# Patient Record
Sex: Female | Born: 1950 | Race: White | Hispanic: No | Marital: Married | Smoking: Former smoker
Health system: Southern US, Community
[De-identification: ages and names within clinical notes are randomized; demographics above are authoritative.]

## PROBLEM LIST (undated history)

## (undated) DIAGNOSIS — Z862 Personal history of diseases of the blood and blood-forming organs and certain disorders involving the immune mechanism: Secondary | ICD-10-CM

## (undated) DIAGNOSIS — M810 Age-related osteoporosis without current pathological fracture: Secondary | ICD-10-CM

## (undated) DIAGNOSIS — C801 Malignant (primary) neoplasm, unspecified: Secondary | ICD-10-CM

## (undated) DIAGNOSIS — R112 Nausea with vomiting, unspecified: Secondary | ICD-10-CM

## (undated) DIAGNOSIS — G43909 Migraine, unspecified, not intractable, without status migrainosus: Secondary | ICD-10-CM

## (undated) DIAGNOSIS — T8859XA Other complications of anesthesia, initial encounter: Secondary | ICD-10-CM

## (undated) DIAGNOSIS — J40 Bronchitis, not specified as acute or chronic: Secondary | ICD-10-CM

## (undated) DIAGNOSIS — T7840XA Allergy, unspecified, initial encounter: Secondary | ICD-10-CM

## (undated) DIAGNOSIS — Z9889 Other specified postprocedural states: Secondary | ICD-10-CM

## (undated) DIAGNOSIS — C569 Malignant neoplasm of unspecified ovary: Secondary | ICD-10-CM

## (undated) DIAGNOSIS — D649 Anemia, unspecified: Secondary | ICD-10-CM

## (undated) DIAGNOSIS — N809 Endometriosis, unspecified: Secondary | ICD-10-CM

## (undated) HISTORY — PX: TUBAL LIGATION: SHX77

## (undated) HISTORY — PX: ABDOMINAL HYSTERECTOMY: SHX81

## (undated) HISTORY — DX: Personal history of diseases of the blood and blood-forming organs and certain disorders involving the immune mechanism: Z86.2

## (undated) HISTORY — DX: Endometriosis, unspecified: N80.9

## (undated) HISTORY — PX: FRACTURE SURGERY: SHX138

## (undated) HISTORY — PX: TONSILLECTOMY AND ADENOIDECTOMY: SUR1326

## (undated) HISTORY — DX: Malignant neoplasm of unspecified ovary: C56.9

## (undated) HISTORY — PX: LAPAROSCOPY: SHX197

## (undated) HISTORY — PX: COLONOSCOPY: SHX174

## (undated) HISTORY — DX: Allergy, unspecified, initial encounter: T78.40XA

## (undated) HISTORY — DX: Migraine, unspecified, not intractable, without status migrainosus: G43.909

## (undated) HISTORY — PX: OTHER SURGICAL HISTORY: SHX169

## (undated) HISTORY — DX: Age-related osteoporosis without current pathological fracture: M81.0

---

## 2000-09-21 ENCOUNTER — Other Ambulatory Visit: Admission: RE | Admit: 2000-09-21 | Discharge: 2000-09-21 | Payer: Self-pay | Admitting: Obstetrics and Gynecology

## 2001-09-27 ENCOUNTER — Other Ambulatory Visit: Admission: RE | Admit: 2001-09-27 | Discharge: 2001-09-27 | Payer: Self-pay | Admitting: Obstetrics and Gynecology

## 2003-05-29 ENCOUNTER — Other Ambulatory Visit: Admission: RE | Admit: 2003-05-29 | Discharge: 2003-05-29 | Payer: Self-pay | Admitting: Obstetrics and Gynecology

## 2004-06-28 ENCOUNTER — Other Ambulatory Visit: Admission: RE | Admit: 2004-06-28 | Discharge: 2004-06-28 | Payer: Self-pay | Admitting: Obstetrics and Gynecology

## 2004-09-16 ENCOUNTER — Ambulatory Visit (HOSPITAL_COMMUNITY): Admission: RE | Admit: 2004-09-16 | Discharge: 2004-09-16 | Payer: Self-pay

## 2005-07-28 ENCOUNTER — Other Ambulatory Visit: Admission: RE | Admit: 2005-07-28 | Discharge: 2005-07-28 | Payer: Self-pay | Admitting: Obstetrics and Gynecology

## 2006-08-05 ENCOUNTER — Other Ambulatory Visit: Admission: RE | Admit: 2006-08-05 | Discharge: 2006-08-05 | Payer: Self-pay | Admitting: Obstetrics and Gynecology

## 2007-08-20 ENCOUNTER — Other Ambulatory Visit: Admission: RE | Admit: 2007-08-20 | Discharge: 2007-08-20 | Payer: Self-pay | Admitting: Obstetrics and Gynecology

## 2008-09-01 ENCOUNTER — Other Ambulatory Visit: Admission: RE | Admit: 2008-09-01 | Discharge: 2008-09-01 | Payer: Self-pay | Admitting: Obstetrics and Gynecology

## 2009-06-01 ENCOUNTER — Encounter: Admission: RE | Admit: 2009-06-01 | Discharge: 2009-06-01 | Payer: Self-pay | Admitting: Orthopedic Surgery

## 2011-04-18 NOTE — Op Note (Signed)
NAME:  Karen Winters, DATTILO NO.:  [id number]   MEDICAL RECORD NO.:  [id number]          PATIENT TYPE:  AMB   LOCATION:  ENDO                         FACILITY:  [facility]   PHYSICIAN:  Anselmo Rod, M.D.  DATE OF BIRTH:  25-Oct-1951   DATE OF PROCEDURE:  DATE OF DISCHARGE:                                 OPERATIVE REPORT   PROCEDURE PERFORMED:  Screening colonoscopy.   ENDOSCOPIST:  Charna Elizabeth, M.D.   INSTRUMENT USED:  Olympus video colonoscope.   INDICATIONS FOR PROCEDURE:  The patient is a 59 year old white female  undergoing screening colonoscopy to rule out colonic polyps, masses, etc.   PREPROCEDURE PREPARATION:  Informed consent was procured from the patient.  The patient was fasted for eight hours prior to the procedure and prepped  with a bottle of magnesium citrate and a gallon of GoLYTELY the night prior  to the procedure.   PREPROCEDURE PHYSICAL:  The patient had stable vital signs.  Neck supple.  Chest clear to auscultation.  S1 and S2 regular.  Abdomen soft with normal  bowel sounds.   DESCRIPTION OF PROCEDURE:  The patient was placed in left lateral decubitus  position and sedated with 60 mg of Demerol and 6 mg of Versed in slow  incremental doses.  Once the patient was adequately sedated and maintained  on low flow oxygen and continuous cardiac monitoring, the Olympus video  colonoscope was advanced from the rectum to the cecum.  The appendicular  orifice and ileocecal valve were clearly visualized and photographed.  The  patient's position was changed from the left lateral to supine and the right  lateral position with gentle application of abdominal pressure to reach the  cecum.  Retroflexion in the rectum revealed small internal hemorrhoids.  No  masses, polyps, erosions, ulcerations or diverticula were seen.  The patient  tolerated the procedure well without immediate complications.   IMPRESSION:  1.  Normal colonoscopy up to the cecum except  for small internal      hemorrhoids.  2.  Ileocecal valve and appendicular orifice clearly recognized and appeared      normal.   RECOMMENDATIONS:  1.  Continue high fiber diet with liberal fluid intake.  2.  Repeat colorectal cancer screening is recommended in the next 10 years      unless the patient develops any abnormal symptoms in the interim.  3.  Outpatient followup as need arises in the future.       JNM/MEDQ  D:  09/16/2004  T:  09/16/2004  Job:  [id number]   cc:   Edwena Felty. Romine, M.D.  [address], [address]  [place]  [place] [address]  Fax: [phone]   [facility]

## 2012-03-29 DIAGNOSIS — T6391XA Toxic effect of contact with unspecified venomous animal, accidental (unintentional), initial encounter: Secondary | ICD-10-CM | POA: Insufficient documentation

## 2012-03-29 DIAGNOSIS — G43909 Migraine, unspecified, not intractable, without status migrainosus: Secondary | ICD-10-CM | POA: Insufficient documentation

## 2013-04-04 DIAGNOSIS — Z87891 Personal history of nicotine dependence: Secondary | ICD-10-CM | POA: Insufficient documentation

## 2013-08-25 ENCOUNTER — Encounter: Payer: Self-pay | Admitting: Obstetrics & Gynecology

## 2013-12-07 ENCOUNTER — Ambulatory Visit: Payer: Self-pay | Admitting: Obstetrics and Gynecology

## 2013-12-07 ENCOUNTER — Ambulatory Visit: Payer: Self-pay | Admitting: Obstetrics & Gynecology

## 2013-12-08 ENCOUNTER — Encounter: Payer: Self-pay | Admitting: Obstetrics & Gynecology

## 2013-12-09 ENCOUNTER — Encounter: Payer: Self-pay | Admitting: Obstetrics & Gynecology

## 2013-12-09 ENCOUNTER — Ambulatory Visit (INDEPENDENT_AMBULATORY_CARE_PROVIDER_SITE_OTHER): Payer: BC Managed Care – PPO | Admitting: Obstetrics & Gynecology

## 2013-12-09 VITALS — BP 122/62 | HR 72 | Resp 20 | Ht 65.0 in | Wt 134.0 lb

## 2013-12-09 DIAGNOSIS — Z01419 Encounter for gynecological examination (general) (routine) without abnormal findings: Secondary | ICD-10-CM

## 2013-12-09 NOTE — Progress Notes (Signed)
63 y.o. G2P2 MarriedCaucasianF here for annual exam.  H/O menstrual migraines.  Has relpax for this.  Uses only a few each month.  Just got 24 and doesn't need RF yet.  Form placed in old paper chart, if we need it this year.  Labs with PCP last year--all good.    Patient's last menstrual period was 10/31/2008.          Sexually active: yes  The current method of family planning is tubal ligation.    Exercising: yes  spin, run, swim, elliptical, hike, pliates, stairmaster, and rowing Smoker:  no  Health Maintenance: Pap:  11/10/12 WNL/negative HR HPV History of abnormal Pap:  no MMG:  06/22/13 3D, 06/28/13 right breast cyst aspiration, 6 month [place] scheduled for 12/29/13 Colonoscopy:  2005 repeat in 10 years BMD:   none TDaP:  Will check with PCP appointment scheduled for 4/15 Screening Labs: PCP, Hb today: PCP, Urine today: PCP   reports that she has never smoked. She has never used smokeless tobacco. She reports that she drinks about 0.5 ounces of alcohol per week. She reports that she does not use illicit drugs.  Past Medical History  Diagnosis Date  . Menstrual migraine   . Endometriosis     Past Surgical History  Procedure Laterality Date  . Tonsillectomy and adenoidectomy    . Cesarean section      and BTSP  . Laparoscopy      for infertility-endometriosis  . Laparoscopy      2nd for infertility  . Mole removed      precancerous mole right upper abd  . Breast cyst aspiration      [facility] 7/14    Current Outpatient Prescriptions  Medication Sig Dispense Refill  . acetaminophen (TYLENOL) 325 MG tablet Take 325 mg by mouth every 6 (six) hours as needed.      . eletriptan (RELPAX) 40 MG tablet Take 40 mg by mouth as needed for migraine or headache. One tablet by mouth at onset of headache. May repeat in 2 hours if headache persists or recurs.      Marland Kitchen EPINEPHrine (EPIPEN 2-PAK IJ) Inject as directed as needed.      . Multiple Vitamins-Minerals (MULTIVITAMIN PO) Take by mouth.        No current facility-administered medications for this visit.    Family History  Problem Relation Age of Onset  . Diabetes Maternal Grandmother   . Breast cancer Maternal Grandmother   . Renal cancer Father   . Hypertension Sister   . Hypertension Brother   . Heart attack Paternal Grandfather   . Hypothyroidism Sister     ROS:  Pertinent items are noted in HPI.  Otherwise, a comprehensive ROS was negative.  Exam:   BP 122/62  Pulse 72  Resp 20  Ht 5\' 5"  (1.651 m)  Wt 134 lb (60.782 kg)  BMI 22.30 kg/m2  LMP 10/31/2008  Weight change: +8 lbs   Height: 5\' 5"  (165.1 cm)  Ht Readings from Last 3 Encounters:  12/09/13 5\' 5"  (1.651 m)    General appearance: alert, cooperative and appears stated age Head: Normocephalic, without obvious abnormality, atraumatic Neck: no adenopathy, supple, symmetrical, trachea midline and thyroid normal to inspection and palpation Lungs: clear to auscultation bilaterally Breasts: normal appearance, no masses or tenderness Heart: regular rate and rhythm Abdomen: soft, non-tender; bowel sounds normal; no masses,  no organomegaly Extremities: extremities normal, atraumatic, no cyanosis or edema Skin: Skin color, texture, turgor normal. No rashes  or lesions Lymph nodes: Cervical, supraclavicular, and axillary nodes normal. No abnormal inguinal nodes palpated Neurologic: Grossly normal   Pelvic: External genitalia:  no lesions              Urethra:  normal appearing urethra with no masses, tenderness or lesions              Bartholins and Skenes: normal                 Vagina: normal appearing vagina with normal color and discharge, no lesions              Cervix: no lesions              Pap taken: no Bimanual Exam:  Uterus:  normal size, contour, position, consistency, mobility, non-tender              Adnexa: normal adnexa and no mass, fullness, tenderness               Rectovaginal: Confirms               Anus:  normal sphincter tone, no  lesions  A:  Well Woman with normal exam Menopausal, not on HRT H/O menstrual migraines H/O endometriosis  P:   Mammogram yearly.  H/O cyst aspiration.  Has follow up scheduled for the end of the month. pap smear with neg HR HPV 12/13.  No Pap today. Schedule BMD this year. Colonoscopy due end of the year. Does not need rx for Relpax 40mg .  Will call if need RF.  [facility]. return annually or prn  An After Visit Summary was printed and given to the patient.

## 2013-12-09 NOTE — Patient Instructions (Signed)

## 2013-12-29 ENCOUNTER — Telehealth: Payer: Self-pay | Admitting: Emergency Medicine

## 2013-12-29 DIAGNOSIS — M810 Age-related osteoporosis without current pathological fracture: Secondary | ICD-10-CM

## 2013-12-29 NOTE — Telephone Encounter (Signed)
Patient at the [facility] states that she was told she needs Dexa. Dexa needed per [facility]EX with Dr. Sabra Heck at 12/09/13. Dexa ordered. Will be done today.  Routing to provider for final review. Patient agreeable to disposition. Will close encounter

## 2014-01-05 ENCOUNTER — Telehealth: Payer: Self-pay

## 2014-01-05 NOTE — Telephone Encounter (Signed)
lmtcb to discuss BMD//kn 

## 2014-01-05 NOTE — Telephone Encounter (Signed)
Patient notified of BMD results-appointment to discuss made for 01/13/14//kn

## 2014-01-13 ENCOUNTER — Ambulatory Visit (INDEPENDENT_AMBULATORY_CARE_PROVIDER_SITE_OTHER): Payer: BC Managed Care – PPO | Admitting: Obstetrics & Gynecology

## 2014-01-13 ENCOUNTER — Encounter: Payer: Self-pay | Admitting: Obstetrics & Gynecology

## 2014-01-13 VITALS — BP 125/92 | HR 67 | Resp 18 | Ht 65.0 in | Wt 132.0 lb

## 2014-01-13 DIAGNOSIS — M810 Age-related osteoporosis without current pathological fracture: Secondary | ICD-10-CM

## 2014-01-13 LAB — COMPREHENSIVE METABOLIC PANEL
ALK PHOS: 85 U/L (ref 39–117)
ALT: 12 U/L (ref 0–35)
AST: 23 U/L (ref 0–37)
Albumin: 4.1 g/dL (ref 3.5–5.2)
BUN: 14 mg/dL (ref 6–23)
CO2: 28 mEq/L (ref 19–32)
CREATININE: 0.72 mg/dL (ref 0.50–1.10)
Calcium: 9.9 mg/dL (ref 8.4–10.5)
Chloride: 108 mEq/L (ref 96–112)
Glucose, Bld: 87 mg/dL (ref 70–99)
POTASSIUM: 5.2 meq/L (ref 3.5–5.3)
Sodium: 143 mEq/L (ref 135–145)
Total Bilirubin: 0.5 mg/dL (ref 0.2–1.2)
Total Protein: 7 g/dL (ref 6.0–8.3)

## 2014-01-13 NOTE — Patient Instructions (Signed)
Osteoporosis Throughout your life, your body breaks down old bone and replaces it with new bone. As you get older, your body does not replace bone as quickly as it breaks it down. By the age of 30 years, most people begin to gradually lose bone because of the imbalance between bone loss and replacement. Some people lose more bone than others. Bone loss beyond a specified normal degree is considered osteoporosis.  Osteoporosis affects the strength and durability of your bones. The inside of the ends of your bones and your flat bones, like the bones of your pelvis, look like honeycomb, filled with tiny open spaces. As bone loss occurs, your bones become less dense. This means that the open spaces inside your bones become bigger and the walls between these spaces become thinner. This makes your bones weaker. Bones of a person with osteoporosis can become so weak that they can break (fracture) during minor accidents, such as a simple fall. CAUSES  The following factors have been associated with the development of osteoporosis:  Smoking.  Drinking more than 2 alcoholic drinks several days per week.  Long-term use of certain medicines:  Corticosteroids.  Chemotherapy medicines.  Thyroid medicines.  Antiepileptic medicines.  Gonadal hormone suppression medicine.  Immunosuppression medicine.  Being underweight.  Lack of physical activity.  Lack of exposure to the sun. This can lead to vitamin D deficiency.  Certain medical conditions:  Certain inflammatory bowel diseases, such as Crohn disease and ulcerative colitis.  Diabetes.  Hyperthyroidism.  Hyperparathyroidism. RISK FACTORS Anyone can develop osteoporosis. However, the following factors can increase your risk of developing osteoporosis:  Gender Women are at higher risk than men.  Age Being older than 50 years increases your risk.  Ethnicity White and Asian people have an increased risk.  Weight Being extremely  underweight can increase your risk of osteoporosis.  Family history of osteoporosis Having a family member who has developed osteoporosis can increase your risk. SYMPTOMS  Usually, people with osteoporosis have no symptoms.  DIAGNOSIS  Signs during a physical exam that may prompt your caregiver to suspect osteoporosis include:  Decreased height. This is usually caused by the compression of the bones that form your spine (vertebrae) because they have weakened and become fractured.  A curving or rounding of the upper back (kyphosis). To confirm signs of osteoporosis, your caregiver may request a procedure that uses 2 low-dose X-ray beams with different levels of energy to measure your bone mineral density (dual-energy X-ray absorptiometry [DXA]). Also, your caregiver may check your level of vitamin D. TREATMENT  The goal of osteoporosis treatment is to strengthen bones in order to decrease the risk of bone fractures. There are different types of medicines available to help achieve this goal. Some of these medicines work by slowing the processes of bone loss. Some medicines work by increasing bone density. Treatment also involves making sure that your levels of calcium and vitamin D are adequate. PREVENTION  There are things you can do to help prevent osteoporosis. Adequate intake of calcium and vitamin D can help you achieve optimal bone mineral density. Regular exercise can also help, especially resistance and weight-bearing activities. If you smoke, quitting smoking is an important part of osteoporosis prevention. MAKE SURE YOU:  Understand these instructions.  Will watch your condition.  Will get help right away if you are not doing well or get worse. FOR MORE INFORMATION www.osteo.org and www.nof.org Document Released: 08/27/2005 Document Revised: 03/14/2013 Document Reviewed: 11/01/2011 [employer] Patient Information 2014 [employer].  

## 2014-01-13 NOTE — Progress Notes (Signed)
Patient ID: Karen Winters, female   DOB: [birth year], 63 y.o.   MRN: [id number]  Subjective:    63 yrs Married Caucasian G2P2  female here to discuss recent BMD obtained 1/15 at [facility] showing osteoporosis in right hip, total measurement, as well as in two vertebral measurements.  There was osteopenia in all other measurements.     Osteoporosis Risk Factors  Nonmodifiable Personal Hx of fracture as an adult: no Hx of fracture in first-degree relative: no Caucasian race: yes Advanced age: no Female sex: yes Dementia: no Poor health/frailty: no  Potentially modifiable: Tobacco use: no Low body weight (<127 lbs): no Estrogen deficiency  early menopause (age <45) or bilateral ovariectomy: no  prolonged premenopausal amenorrhea (>1 yr): no Low calcium intake (lifelong): yes Alcohol use more than 2 drinks per day: no Recurrent falls: no Inadequate physical activity: no, patient is an avid exerciser  Current calcium and Vit D intake: 200mg  calcium and 800 IU Vit D in MVI  Review of Systems A comprehensive review of systems was negative.     Objective:   PHYSICAL EXAM BP 125/92  Pulse 67  Resp 18  Ht 5\' 5"  (1.651 m)  Wt 132 lb (59.875 kg)  BMI 21.97 kg/m2  LMP 10/31/2008 General appearance: alert and cooperative  Imaging Bone Density: Spine T Score: -2.0, total, Hip T Score: -2.6 right hip (neck -2.3)   Done on 1/15 FRAX score:  10 year probability of hip fracture: 3%                        10-year probability of major osteoporotic fractures combined is 17.2%                                          Assessment:   Osteoporosis with T score -2.6 right hip   Plan:   1.  Patient counseled in adequate calcium and vitamin D exposure.  Calcium - 500 - 1000 mg elemental calcium/day in divided doses  Vitamin D - 800 IU/day  Told not to take at same time as bisphosphonate. 2.  Exercise recommended at least 30 minutes 3 times per week.  3.  Pt adamantly declines oral  medication therapy 4.  TSH, CMP, Vit D today 5.  Repeat bone density in 2 years.  ~15 minutes spent with patient >50% of time was in face to face discussion of above.

## 2014-01-14 LAB — TSH: TSH: 0.985 u[IU]/mL (ref 0.350–4.500)

## 2014-01-14 LAB — VITAMIN D 25 HYDROXY (VIT D DEFICIENCY, FRACTURES): VIT D 25 HYDROXY: 44 ng/mL (ref 30–89)

## 2014-01-17 ENCOUNTER — Telehealth: Payer: Self-pay

## 2014-01-17 NOTE — Telephone Encounter (Signed)
Lmtcb//kn 

## 2014-01-17 NOTE — Telephone Encounter (Signed)
Message copied by Robley Fries on Tue Jan 17, 2014 11:30 AM ------      Message from: Megan Salon      Created: Sun Jan 15, 2014  3:26 PM       Inform vit d 4.  CMP nl.  TSH nl.  Patient had BMD consult last week.  Not going to start medications.  Will repeat BMD two years. ------

## 2014-01-19 NOTE — Telephone Encounter (Signed)
Patient notified of all lab results.

## 2014-06-20 ENCOUNTER — Telehealth: Payer: Self-pay | Admitting: Obstetrics & Gynecology

## 2014-06-20 NOTE — Telephone Encounter (Signed)
Left detailed message at number provided [phone]. Okay per ROI and patient. Advised last colonoscopy we have on file is for 2005. Last seen with Dr.Mann in Oct 2005. Advised if any further questions to give [place] a call back at [phone].  Routing to provider for final review. Patient agreeable to disposition. Will close encounter

## 2014-06-20 NOTE — Telephone Encounter (Signed)
Pt is calling to find out when her last colonoscopy was? She thinks it was with Dr. Collene Mares in October 2005. Pt states it is ok to leave a detailed message.

## 2014-10-02 ENCOUNTER — Encounter: Payer: Self-pay | Admitting: Obstetrics & Gynecology

## 2014-12-28 ENCOUNTER — Encounter: Payer: Self-pay | Admitting: Obstetrics & Gynecology

## 2014-12-28 ENCOUNTER — Ambulatory Visit (INDEPENDENT_AMBULATORY_CARE_PROVIDER_SITE_OTHER): Payer: BLUE CROSS/BLUE SHIELD | Admitting: Obstetrics & Gynecology

## 2014-12-28 VITALS — BP 142/90 | HR 64 | Resp 16 | Ht 64.75 in | Wt 135.2 lb

## 2014-12-28 DIAGNOSIS — Z124 Encounter for screening for malignant neoplasm of cervix: Secondary | ICD-10-CM

## 2014-12-28 DIAGNOSIS — Z01419 Encounter for gynecological examination (general) (routine) without abnormal findings: Secondary | ICD-10-CM

## 2014-12-28 MED ORDER — ELETRIPTAN HYDROBROMIDE 40 MG PO TABS: 40.0000 mg | ORAL_TABLET | ORAL | Status: DC | PRN

## 2014-12-28 NOTE — Progress Notes (Signed)
64 y.o. G2P2 MarriedCaucasianF here for annual exam.  No vaginal bleeding.  Doing well.  Has had blood work this year.  Brought BPs here with her as she has white coat hypertension.  BPs are all 93-127/63/79.    Patient's last menstrual period was 10/31/2008.          Sexually active: Yes.    The current method of family planning is tubal ligation.    Exercising: Yes.    walking, running, spin class, body pump, and swimming Smoker:  Former smoker years ago  Health Maintenance: Pap:  11/10/12 WNL/negative HR HPV History of abnormal Pap:  no MMG:  07/19/14 3D-normal Colonoscopy:  2005-repeat scheduled for 01/03/15 with Dr Collene Mares BMD:   12/29/13-osteoporosis TDaP:  08/15/14 with PCP Screening Labs: PCP, Hb today: PCP, Urine today: PCP   reports that she has quit smoking. She has never used smokeless tobacco. She reports that she drinks alcohol. She reports that she does not use illicit drugs.  Past Medical History  Diagnosis Date  . Menstrual migraine   . Endometriosis   . Osteoporosis     Past Surgical History  Procedure Laterality Date  . Tonsillectomy and adenoidectomy    . Cesarean section  1986, 1990    and BTSP  . Laparoscopy      for infertility-endometriosis  . Laparoscopy      2nd for infertility  . Mole removed      precancerous mole right upper abd  . Breast cyst aspiration      [facility] 7/14    Current Outpatient Prescriptions  Medication Sig Dispense Refill  . acetaminophen (TYLENOL) 325 MG tablet Take 325 mg by mouth every 6 (six) hours as needed.    . Calcium-Magnesium-Vitamin D (CALCIUM 500 PO) Take by mouth daily.    Marland Kitchen eletriptan (RELPAX) 40 MG tablet Take 40 mg by mouth as needed for migraine or headache. One tablet by mouth at onset of headache. May repeat in 2 hours if headache persists or recurs.    Marland Kitchen EPINEPHrine (EPIPEN 2-PAK IJ) Inject as directed as needed.    . Multiple Vitamins-Minerals (MULTIVITAMIN PO) Take by mouth.     No current  facility-administered medications for this visit.    Family History  Problem Relation Age of Onset  . Diabetes Maternal Grandmother   . Breast cancer Maternal Grandmother   . Renal cancer Father   . Hypertension Sister   . Hypertension Brother   . Heart attack Paternal Grandfather   . Hypothyroidism Sister     ROS:  Pertinent items are noted in HPI.  Otherwise, a comprehensive ROS was negative.  Exam:   General appearance: alert, cooperative and appears stated age Head: Normocephalic, without obvious abnormality, atraumatic Neck: no adenopathy, supple, symmetrical, trachea midline and thyroid normal to inspection and palpation Lungs: clear to auscultation bilaterally Breasts: normal appearance, no masses or tenderness Heart: regular rate and rhythm Abdomen: soft, non-tender; bowel sounds normal; no masses,  no organomegaly Extremities: extremities normal, atraumatic, no cyanosis or edema Skin: Skin color, texture, turgor normal. No rashes or lesions Lymph nodes: Cervical, supraclavicular, and axillary nodes normal. No abnormal inguinal nodes palpated Neurologic: Grossly normal   Pelvic: External genitalia:  no lesions              Urethra:  normal appearing urethra with no masses, tenderness or lesions              Bartholins and Skenes: normal  Vagina: normal appearing vagina with normal color and discharge, no lesions              Cervix: no lesions              Pap taken: Yes.   Bimanual Exam:  Uterus:  normal size, contour, position, consistency, mobility, non-tender              Adnexa: normal adnexa and no mass, fullness, tenderness               Rectovaginal: Confirms               Anus:  normal sphincter tone, no lesions  Chaperone was present for exam.  A:  Well Woman with normal exam Menopausal, not on HRT H/O menstrual migraines H/O endometriosis Osteoporosis  P: Mammogram yearly.  pap smear with neg HR HPV 12/13. Pap today. BMD  due beginning of next year Colonoscopy scheduled for next week Relpax 40mg , can repeat in 2 hours if needed.  #24 tablets/4RF. return annually or prn

## 2014-12-29 LAB — IPS PAP TEST WITH REFLEX TO HPV

## 2016-02-28 ENCOUNTER — Encounter: Payer: Self-pay | Admitting: Obstetrics & Gynecology

## 2016-02-28 ENCOUNTER — Ambulatory Visit (INDEPENDENT_AMBULATORY_CARE_PROVIDER_SITE_OTHER): Payer: BLUE CROSS/BLUE SHIELD | Admitting: Obstetrics & Gynecology

## 2016-02-28 VITALS — BP 140/80 | HR 68 | Resp 16 | Ht 65.0 in | Wt 132.0 lb

## 2016-02-28 DIAGNOSIS — Z205 Contact with and (suspected) exposure to viral hepatitis: Secondary | ICD-10-CM | POA: Diagnosis not present

## 2016-02-28 DIAGNOSIS — Z01419 Encounter for gynecological examination (general) (routine) without abnormal findings: Secondary | ICD-10-CM

## 2016-02-28 LAB — HEPATITIS C ANTIBODY: HCV AB: NEGATIVE

## 2016-02-28 MED ORDER — ELETRIPTAN HYDROBROMIDE 40 MG PO TABS: 40.0000 mg | ORAL_TABLET | ORAL | Status: DC | PRN

## 2016-02-28 NOTE — Progress Notes (Signed)
65 y.o. G2P2 MarriedCaucasianF here for annual exam.  Doing well.  No vaginal bleeding.    PCP:  Dr. Coletta Memos  Patient's last menstrual period was 10/31/2008.          Sexually active: Yes.    The current method of family planning is tubal ligation and post menopausal status.    Exercising: Yes.    Spin, running, elliptical  Smoker:  no  Health Maintenance: Pap:  12/28/14 Neg. 10/2012 Neg. HR HPV:neg History of abnormal Pap:  no MMG:  07/24/15 BIRADS1:neg Colonoscopy:  01/03/15 Normal.  Follow-up 10 year.  Dr. Collene Mares. BMD:   12/29/13 Osteoporosis.  Will repeat with MMG TDaP:  08/2014  Screening Labs: PCP, Urine today: PCP   reports that she has quit smoking. She has never used smokeless tobacco. She reports that she drinks alcohol. She reports that she does not use illicit drugs.  Past Medical History  Diagnosis Date  . Menstrual migraine   . Endometriosis   . Osteoporosis     Past Surgical History  Procedure Laterality Date  . Tonsillectomy and adenoidectomy    . Cesarean section  1986, 1990    and BTSP  . Laparoscopy      for infertility-endometriosis  . Laparoscopy      2nd for infertility  . Mole removed      precancerous mole right upper abd  . Breast cyst aspiration      [facility] 7/14    Current Outpatient Prescriptions  Medication Sig Dispense Refill  . acetaminophen (TYLENOL) 325 MG tablet Take 325 mg by mouth every 6 (six) hours as needed.    . Calcium-Magnesium-Vitamin D (CALCIUM 500 PO) Take by mouth daily.    Marland Kitchen eletriptan (RELPAX) 40 MG tablet Take 1 tablet (40 mg total) by mouth as needed for migraine or headache. Max dosage 40mg  in 24 hours 24 tablet 4  . EPINEPHrine (EPIPEN 2-PAK IJ) Inject as directed as needed.    . Multiple Vitamins-Minerals (MULTIVITAMIN PO) Take by mouth.     No current facility-administered medications for this visit.    Family History  Problem Relation Age of Onset  . Diabetes Maternal Grandmother   . Breast cancer Maternal  Grandmother   . Renal cancer Father   . Hypertension Sister   . Hypertension Brother   . Heart attack Paternal Grandfather   . Hypothyroidism Sister     ROS:  Pertinent items are noted in HPI.  Otherwise, a comprehensive ROS was negative.  Exam:   General appearance: alert, cooperative and appears stated age Head: Normocephalic, without obvious abnormality, atraumatic Neck: no adenopathy, supple, symmetrical, trachea midline and thyroid normal to inspection and palpation Lungs: clear to auscultation bilaterally Breasts: normal appearance, no masses or tenderness Heart: regular rate and rhythm Abdomen: soft, non-tender; bowel sounds normal; no masses,  no organomegaly Extremities: extremities normal, atraumatic, no cyanosis or edema Skin: Skin color, texture, turgor normal. No rashes or lesions Lymph nodes: Cervical, supraclavicular, and axillary nodes normal. No abnormal inguinal nodes palpated Neurologic: Grossly normal   Pelvic: External genitalia:  no lesions              Urethra:  normal appearing urethra with no masses, tenderness or lesions              Bartholins and Skenes: normal                 Vagina: normal appearing vagina with normal color and discharge, no lesions  Cervix: no lesions              Pap taken: No. Bimanual Exam:  Uterus:  normal size, contour, position, consistency, mobility, non-tender              Adnexa: normal adnexa and no mass, fullness, tenderness               Rectovaginal: Confirms               Anus:  normal sphincter tone, no lesions  Chaperone was present for exam.  A:  Well Woman with normal exam Menopausal, not on HRT H/O menstrual migraines, much improved H/O endometriosis Osteoporosis  P: Mammogram yearly.  pap smear with neg HR HPV 12/13. Pap 2016 neg.  No pap today. BMD and MMG 8/17.  Pt will schedule. Relpax 40mg , can repeat in 2 hours if needed. #24 tablets/3RF.  Rx sent to mail order.  Pt will  call if cost is still ridiculous. Return annually

## 2016-02-29 ENCOUNTER — Telehealth: Payer: Self-pay | Admitting: Obstetrics & Gynecology

## 2016-02-29 NOTE — Telephone Encounter (Signed)
Return call to patient. She states Relpax price for 90 day supply is 845 dollars per month and patient does not want do that this year.  She states she and Dr. Sabra Heck discussed this. Patient has not tried any alternatives.  Attempted to understand from patient if she was aware of what her insurance may cover, but patient states "thats all, that's why I called." Advised will send information to Dr. Sabra Heck.  Dr. Sabra Heck, I cannot tell by patients insurance card if she has prescription coverage, or may need to meet a deductible prior to coverage of medications.

## 2016-02-29 NOTE — Telephone Encounter (Signed)
Patient is calling regarding the migraine prescription given yesterday. Patient would to change due to cost. Confirmed pharmacy with patient.

## 2016-03-06 MED ORDER — SUMATRIPTAN SUCCINATE 100 MG PO TABS: 100.0000 mg | ORAL_TABLET | Freq: Once | ORAL | Status: DC

## 2016-03-06 NOTE — Telephone Encounter (Signed)
I'd recommend trying imitrex 100mg  at headache onset and then repeat in 2 hours if needed.  Max dosage 200mg /24 hrs.  #9/12 RF to pharmacy to see cost.  Can always have 90 day supply dispensed if this is what pt desires.  Thanks.

## 2016-03-06 NOTE — Telephone Encounter (Signed)
Patient called and she is advised of message from Dr. Sabra Heck.  She will try Imitrex and use as directed and verbalized understanding of instructions for medication.  She is advised to call with any concerns about coverage of medication and requests 90 day supply be sent to [facility].  Rx is sent.  Routing to provider for final review. Patient agreeable to disposition. Will close encounter.

## 2016-08-05 ENCOUNTER — Encounter: Payer: Self-pay | Admitting: Obstetrics & Gynecology

## 2016-08-05 ENCOUNTER — Telehealth: Payer: Self-pay | Admitting: *Deleted

## 2016-08-05 NOTE — Telephone Encounter (Signed)
Message left to return call to Panagiotis Oelkers at [phone] to review BMD results.  

## 2016-08-05 NOTE — Telephone Encounter (Signed)
Patient returned call. Patient notified of BMD results. Patient declined to schedule consult stating, "I had one two years ago and Dr. Sabra Heck knows I don't want to take anything. Not until I fell and broke something. I run 20 miles a week" Patient asked the percentage at which her BMD had changed. RN advised that is something Dr. Sabra Heck could review at consult. Patient said if Dr. Sabra Heck really thought it was a drastic change she would be willing to come in for consult. RN advised this message would be sent to Dr. Sabra Heck and our office would return call with any further recommendations.   Routing to provider for review.

## 2016-08-08 NOTE — Telephone Encounter (Signed)
It was a 4% and 7% change in hips and spine from prior one.  Now she does have osteoporosis and she did not with the prior one.  It is ok if she declines any therapy and we will discuss this when she comes for her next AEX.  Just wanted her to be clear there is not osteoporosis.  OK to notify pt and then close encounter.

## 2016-08-12 NOTE — Telephone Encounter (Signed)
Left message per DPR with message from Dr. Sabra Heck. Instructed patient to call with any questions.   Routing to provider for final review. Patient agreeable to disposition. Will close encounter.

## 2017-04-28 ENCOUNTER — Other Ambulatory Visit (HOSPITAL_COMMUNITY)
Admission: RE | Admit: 2017-04-28 | Discharge: 2017-04-28 | Disposition: A | Discharge status: Home / Self Care | Payer: BLUE CROSS/BLUE SHIELD | Source: Ambulatory Visit | Attending: Obstetrics & Gynecology | Admitting: Obstetrics & Gynecology

## 2017-04-28 ENCOUNTER — Encounter: Payer: Self-pay | Admitting: Obstetrics & Gynecology

## 2017-04-28 ENCOUNTER — Ambulatory Visit (INDEPENDENT_AMBULATORY_CARE_PROVIDER_SITE_OTHER): Payer: BLUE CROSS/BLUE SHIELD | Admitting: Obstetrics & Gynecology

## 2017-04-28 VITALS — BP 120/76 | HR 80 | Resp 16 | Ht 64.5 in | Wt 129.0 lb

## 2017-04-28 DIAGNOSIS — Z124 Encounter for screening for malignant neoplasm of cervix: Secondary | ICD-10-CM | POA: Insufficient documentation

## 2017-04-28 DIAGNOSIS — Z01419 Encounter for gynecological examination (general) (routine) without abnormal findings: Secondary | ICD-10-CM

## 2017-04-28 MED ORDER — SUMATRIPTAN SUCCINATE 100 MG PO TABS: 100.0000 mg | ORAL_TABLET | Freq: Once | ORAL | 3 refills | Status: DC

## 2017-04-28 NOTE — Progress Notes (Signed)
66 y.o. G2P2 MarriedCaucasianF here for annual exam.  Doing well.  Denies vaginal bleeding.  Exercising very regularly--hiking very regularly.  Reviewed with pt her BMD.  She does not want to be on medication right now.  Would like to wait until next BMD in 2019.  PCP:  Dr. Coletta Memos.  Had appt scheduled for June.  Patient's last menstrual period was 10/31/2008.          Sexually active: Yes.    The current method of family planning is tubal ligation.    Exercising: Yes.    run, walking, swim, weights etc Smoker:  no  Health Maintenance: Pap:  12/28/14 Neg   11/10/12 Neg. HR HPV:Neg  History of abnormal Pap:  no MMG:  07/24/16 BIRADS1:Neg   Colonoscopy:  01/03/15 f/u 10 years  BMD:   07/24/16 Osteoporosis  TDaP:  08/15/14 Pneumonia vaccine(s):  No Zostavax:   No Hep C testing: 02/28/16 Neg  Screening Labs: PCP   reports that she has quit smoking. She has never used smokeless tobacco. She reports that she drinks alcohol. She reports that she does not use drugs.  Past Medical History:  Diagnosis Date  . Endometriosis   . Menstrual migraine   . Osteoporosis     Past Surgical History:  Procedure Laterality Date  . BREAST CYST ASPIRATION     [facility] 7/14  . [place]   and BTSP  . LAPAROSCOPY     for infertility-endometriosis  . LAPAROSCOPY     2nd for infertility  . mole removed     precancerous mole right upper abd  . TONSILLECTOMY AND ADENOIDECTOMY      Current Outpatient Prescriptions  Medication Sig Dispense Refill  . acetaminophen (TYLENOL) 325 MG tablet Take 325 mg by mouth every 6 (six) hours as needed.    . Calcium-Magnesium-Vitamin D (CALCIUM 500 PO) Take by mouth daily.    . Multiple Vitamins-Minerals (MULTIVITAMIN PO) Take by mouth.    . SUMAtriptan (IMITREX) 100 MG tablet Take 1 tablet (100 mg total) by mouth once. Repeat in 2 hours if headache persists or recurs. Do not take more than two pills in 24 hours. 27 tablet 3  . EPINEPHrine (EPIPEN 2-PAK  IJ) Inject as directed as needed.     No current facility-administered medications for this visit.     Family History  Problem Relation Age of Onset  . Diabetes Maternal Grandmother   . Breast cancer Maternal Grandmother   . Renal cancer Father   . Hypertension Sister   . Hypertension Brother   . Heart attack Paternal Grandfather   . Hypothyroidism Sister   . Other Sister        Wegener's syndrome    ROS:  Pertinent items are noted in HPI.  Otherwise, a comprehensive ROS was negative.  Exam:   BP 120/76 (BP Location: Right Arm, Patient Position: Sitting, Cuff Size: Normal)   Pulse 80   Resp 16   Ht 5' 4.5" (1.638 m)   Wt 129 lb (58.5 kg)   LMP 10/31/2008   BMI 21.80 kg/m   Weight change: -3# Height: 5' 4.5" (163.8 cm)  Ht Readings from Last 3 Encounters:  04/28/17 5' 4.5" (1.638 m)  02/28/16 5\' 5"  (1.651 m)  12/28/14 5' 4.75" (1.645 m)   General appearance: alert, cooperative and appears stated age Head: Normocephalic, without obvious abnormality, atraumatic Neck: no adenopathy, supple, symmetrical, trachea midline and thyroid normal to inspection and palpation Lungs: clear to auscultation  bilaterally Breasts: normal appearance, no masses or tenderness Heart: regular rate and rhythm Abdomen: soft, non-tender; bowel sounds normal; no masses,  no organomegaly Extremities: extremities normal, atraumatic, no cyanosis or edema Skin: Skin color, texture, turgor normal. No rashes or lesions Lymph nodes: Cervical, supraclavicular, and axillary nodes normal. No abnormal inguinal nodes palpated Neurologic: Grossly normal   Pelvic: External genitalia:  no lesions              Urethra:  normal appearing urethra with no masses, tenderness or lesions              Bartholins and Skenes: normal                 Vagina: normal appearing vagina with normal color and discharge, no lesions              Cervix: no lesions              Pap taken: Yes.   Bimanual Exam:  Uterus:  normal  size, contour, position, consistency, mobility, non-tender              Adnexa: normal adnexa and no mass, fullness, tenderness               Rectovaginal: Confirms               Anus:  normal sphincter tone, no lesions  Chaperone was present for exam.  A:  Well Woman with normal exam PMP, no HRT H/O migraines H/O endometriosis Osteoporosis  P:   Mammogram guidelines reviewed pap smear and HR HPV obtained today Plan BMD next year.  Declines treatment right now. Rx for Imitrex 100mg  x 1, repeat in 2 hours.  #24/3RF D/w pt shingrix vaccine today.  She will consider. return annually or prn

## 2017-04-30 LAB — CYTOLOGY - PAP
Diagnosis: NEGATIVE
HPV (WINDOPATH): NOT DETECTED

## 2017-05-25 ENCOUNTER — Ambulatory Visit: Payer: BLUE CROSS/BLUE SHIELD | Admitting: Obstetrics & Gynecology

## 2017-06-12 ENCOUNTER — Ambulatory Visit: Payer: BLUE CROSS/BLUE SHIELD | Admitting: Obstetrics & Gynecology

## 2017-06-19 ENCOUNTER — Ambulatory Visit: Payer: BLUE CROSS/BLUE SHIELD | Admitting: Obstetrics & Gynecology

## 2017-08-31 ENCOUNTER — Encounter: Payer: Self-pay | Admitting: Obstetrics & Gynecology

## 2018-03-10 ENCOUNTER — Telehealth: Payer: Self-pay | Admitting: Obstetrics & Gynecology

## 2018-03-10 NOTE — Telephone Encounter (Signed)
Left message regarding upcoming appointment has been canceled and needs to be rescheduled. °

## 2018-07-12 ENCOUNTER — Ambulatory Visit (INDEPENDENT_AMBULATORY_CARE_PROVIDER_SITE_OTHER): Payer: BLUE CROSS/BLUE SHIELD | Admitting: Obstetrics & Gynecology

## 2018-07-12 ENCOUNTER — Encounter: Payer: Self-pay | Admitting: Obstetrics & Gynecology

## 2018-07-12 VITALS — BP 122/80 | HR 72 | Resp 14 | Ht 64.5 in | Wt 131.4 lb

## 2018-07-12 DIAGNOSIS — Z01419 Encounter for gynecological examination (general) (routine) without abnormal findings: Secondary | ICD-10-CM | POA: Diagnosis not present

## 2018-07-12 MED ORDER — SUMATRIPTAN SUCCINATE 100 MG PO TABS: ORAL_TABLET | ORAL | 4 refills | Status: DC

## 2018-07-12 NOTE — Progress Notes (Signed)
67 y.o. G2P2 MarriedCaucasianF here for annual exam.  Doing well.  Reports had blood in urine with PCP when seen earlier this summer.  Is running a lot this summer.  Running 10 miles twice weekly.  Had just run prior to having urine test.  Pt reports she went back for follow-up and this was normal.  Can seen urine test from 07/01/18 but nothing since.  She is aware I cannot see this and will call PCP to be sure.    Patient's last menstrual period was 10/31/2008.          Sexually active: Yes.    The current method of family planning is post menopausal status.    Exercising: Yes.    running, hike, spin class Smoker:  no  Health Maintenance: Pap:  04/28/17 Neg. HR HPV:neg   12/28/14 neg  History of abnormal Pap:  no MMG:  07/27/17 BIRADS1:Neg. Has appt 07/29/18 Colonoscopy:  01/03/15 f/u 10 years BMD:   07/24/16 Osteoporosis. Has appt 07/29/18 TDaP:  08/15/2014 Pneumonia vaccine(s):  No Shingrix:   Declines having this done at this time. Hep C testing: 02/28/16 Neg  Screening Labs: PCP   reports that she quit smoking about 34 years ago. She has never used smokeless tobacco. She reports that she drinks alcohol. She reports that she does not use drugs.  Past Medical History:  Diagnosis Date  . Endometriosis   . Migraines   . Osteoporosis     Past Surgical History:  Procedure Laterality Date  . BREAST CYST ASPIRATION     [facility] 7/14  . [place]   and BTSP  . LAPAROSCOPY     for infertility-endometriosis  . LAPAROSCOPY     2nd for infertility  . mole removed     precancerous mole right upper abd  . TONSILLECTOMY AND ADENOIDECTOMY      Current Outpatient Medications  Medication Sig Dispense Refill  . acetaminophen (TYLENOL) 325 MG tablet Take 325 mg by mouth every 6 (six) hours as needed.    . Calcium-Magnesium-Vitamin D (CALCIUM 500 PO) Take by mouth daily.    Marland Kitchen EPINEPHrine (EPIPEN 2-PAK IJ) Inject as directed as needed.    . Misc Natural Products (OSTEO BI-FLEX  JOINT SHIELD PO) Take by mouth daily.    . Multiple Vitamins-Minerals (MULTIVITAMIN PO) Take by mouth.    . SUMAtriptan (IMITREX) 100 MG tablet 1 tab with headache onset.  Can repeat in 2 hours.  Max dosage 200mg /24 hours. 27 tablet 4   No current facility-administered medications for this visit.     Family History  Problem Relation Age of Onset  . Diabetes Maternal Grandmother   . Breast cancer Maternal Grandmother   . Renal cancer Father   . Hypertension Sister   . Hypertension Brother   . Heart attack Paternal Grandfather   . Hypothyroidism Sister   . Other Sister        Wegener's syndrome    Review of Systems  Skin: Positive for rash.  All other systems reviewed and are negative.   Exam:   BP 122/80 (BP Location: Right Arm, Patient Position: Sitting, Cuff Size: Normal)   Pulse 72   Resp 14   Ht 5' 4.5" (1.638 m)   Wt 131 lb 6.4 oz (59.6 kg)   LMP 10/31/2008   BMI 22.21 kg/m    Height: 5' 4.5" (163.8 cm)  Ht Readings from Last 3 Encounters:  07/12/18 5' 4.5" (1.638 m)  04/28/17 5' 4.5" (  1.638 m)  02/28/16 5\' 5"  (1.651 m)    General appearance: alert, cooperative and appears stated age Head: Normocephalic, without obvious abnormality, atraumatic Neck: no adenopathy, supple, symmetrical, trachea midline and thyroid normal to inspection and palpation Lungs: clear to auscultation bilaterally Breasts: normal appearance, no masses or tenderness Heart: regular rate and rhythm Abdomen: soft, non-tender; bowel sounds normal; no masses,  no organomegaly Extremities: extremities normal, atraumatic, no cyanosis or edema Skin: Skin color, texture, turgor normal. No rashes or lesions Lymph nodes: Cervical, supraclavicular, and axillary nodes normal. No abnormal inguinal nodes palpated Neurologic: Grossly normal   Pelvic: External genitalia:  no lesions              Urethra:  normal appearing urethra with no masses, tenderness or lesions              Bartholins and Skenes:  normal                 Vagina: normal appearing vagina with normal color and discharge, no lesions              Cervix: no lesions              Pap taken: No. Bimanual Exam:  Uterus:  normal size, contour, position, consistency, mobility, non-tender              Adnexa: normal adnexa and no mass, fullness, tenderness               Rectovaginal: Confirms               Anus:  normal sphincter tone, no lesions  Chaperone was present for exam.  A:  Well Woman with normal exam PMP, no HRT H/O migraines H/O endometriosis Osteoporosis, has declined treatment  P:   Mammogram guidelines reviewed.  Scheduled at the end of the month pap smear and neg HR HPV obtained 2018.  Not indicated today. Rx for Imitrex 100mg  x 1, repeat 2 hours is needed.  #27/4RF Declines Shingrix vaccine Blood work done with PCP.  She will check with Dr. Ronnald Ramp about the last urine test BMD is scheduled Colonoscopy is UTD return annually or prn

## 2018-07-16 ENCOUNTER — Ambulatory Visit: Payer: BLUE CROSS/BLUE SHIELD | Admitting: Obstetrics & Gynecology

## 2018-08-10 ENCOUNTER — Encounter: Payer: Self-pay | Admitting: Obstetrics & Gynecology

## 2019-08-04 ENCOUNTER — Encounter: Payer: Self-pay | Admitting: Obstetrics & Gynecology

## 2019-10-31 NOTE — Progress Notes (Signed)
68 y.o. G2P2 Married White or Caucasian female here for annual exam.  Doing well.  Denies vaginal bleeding.  Seeing new PCP.    Patient's last menstrual period was 10/31/2008.          Sexually active: Yes.    The current method of family planning is post menopausal status.    Exercising: Yes.    Swimming  Smoker:  no  Health Maintenance: Pap: 04/28/17 Neg. HR HPV:neg              12/28/14 neg   History of abnormal Pap:  no MMG:  08/04/2019 Density B Bi-rads 2 benign  Colonoscopy:  01/03/15 f/u 10 years BMD: 07/29/18  Requested report from [place]:  08/15/14 Pneumonia vaccine(s):  09/20/19  Shingrix:   Declines at this time Hep C testing: 02/28/16 Neg  Screening Labs: PCP.  Had this done earlier this year.     reports that she quit smoking about 35 years ago. She has never used smokeless tobacco. She reports current alcohol use. She reports that she does not use drugs.  Past Medical History:  Diagnosis Date  . Endometriosis   . Migraines   . Osteoporosis     Past Surgical History:  Procedure Laterality Date  . [place]   and BTSP  . LAPAROSCOPY     for infertility-endometriosis  . LAPAROSCOPY     2nd for infertility  . mole removed     precancerous mole right upper abd  . TONSILLECTOMY AND ADENOIDECTOMY      Current Outpatient Medications  Medication Sig Dispense Refill  . acetaminophen (TYLENOL) 325 MG tablet Take 325 mg by mouth every 6 (six) hours as needed.    . Calcium-Magnesium-Vitamin D (CALCIUM 500 PO) Take by mouth daily.    Marland Kitchen EPINEPHrine (EPIPEN 2-PAK IJ) Inject as directed as needed.    . Misc Natural Products (OSTEO BI-FLEX JOINT SHIELD PO) Take by mouth daily.    . Multiple Vitamins-Minerals (MULTIVITAMIN PO) Take by mouth.    . SUMAtriptan (IMITREX) 100 MG tablet 1 tab with headache onset.  Can repeat in 2 hours.  Max dosage 200mg /24 hours. 27 tablet 4   No current facility-administered medications for this visit.    Family History   Problem Relation Age of Onset  . Diabetes Maternal Grandmother   . Breast cancer Maternal Grandmother   . Renal cancer Father   . Hypertension Sister   . Hypertension Brother   . Heart attack Paternal Grandfather   . Hypothyroidism Sister   . Other Sister        Wegener's syndrome    Review of Systems  All other systems reviewed and are negative.   Exam:   BP 130/70   Pulse 76   Temp (!) 97.5 F (36.4 C)   Ht 5' 4.5" (1.638 m)   Wt 130 lb 9.6 oz (59.2 kg)   LMP 10/31/2008   SpO2 96%   BMI 22.07 kg/m      Height: 5' 4.5" (163.8 cm)  Ht Readings from Last 3 Encounters:  11/10/19 5' 4.5" (1.638 m)  07/12/18 5' 4.5" (1.638 m)  04/28/17 5' 4.5" (1.638 m)    General appearance: alert, cooperative and appears stated age Head: Normocephalic, without obvious abnormality, atraumatic Neck: no adenopathy, supple, symmetrical, trachea midline and thyroid normal to inspection and palpation Lungs: clear to auscultation bilaterally Breasts: normal appearance, no masses or tenderness Heart: regular rate and rhythm Abdomen: soft, non-tender; bowel sounds  normal; no masses,  no organomegaly Extremities: extremities normal, atraumatic, no cyanosis or edema Skin: Skin color, texture, turgor normal. No rashes or lesions Lymph nodes: Cervical, supraclavicular, and axillary nodes normal. No abnormal inguinal nodes palpated Neurologic: Grossly normal   Pelvic: External genitalia:  no lesions              Urethra:  normal appearing urethra with no masses, tenderness or lesions              Bartholins and Skenes: normal                 Vagina: normal appearing vagina with normal color and discharge, no lesions              Cervix: no lesions              Pap taken: No. Bimanual Exam:  Uterus:  normal size, contour, position, consistency, mobility, non-tender              Adnexa: normal adnexa and no mass, fullness, tenderness               Rectovaginal: Confirms               Anus:   normal sphincter tone, no lesions  Chaperone was present for exam.  A:  Well Woman with normal exam PMP, no HRT H/o migraines under good control H/O endometriosis Osteoporosis, declines treatment  P:   Mammogram guidelines reviewed.   pap smear neg with neg HR 03/2017.  Not indicated today Had BMD 2019.  I will need to try and get copy of this. Lab work UTD with PCP Vaccines UTD except for Shingrix.  Declines for now.   Return annually or prn

## 2019-11-08 ENCOUNTER — Other Ambulatory Visit: Payer: Self-pay

## 2019-11-10 ENCOUNTER — Encounter: Payer: Self-pay | Admitting: Obstetrics & Gynecology

## 2019-11-10 ENCOUNTER — Ambulatory Visit (INDEPENDENT_AMBULATORY_CARE_PROVIDER_SITE_OTHER): Payer: Medicare HMO | Admitting: Obstetrics & Gynecology

## 2019-11-10 ENCOUNTER — Other Ambulatory Visit: Payer: Self-pay

## 2019-11-10 VITALS — BP 130/70 | HR 76 | Temp 97.5°F | Ht 64.5 in | Wt 130.6 lb

## 2019-11-10 DIAGNOSIS — Z01419 Encounter for gynecological examination (general) (routine) without abnormal findings: Secondary | ICD-10-CM | POA: Diagnosis not present

## 2019-12-01 ENCOUNTER — Telehealth: Payer: Self-pay

## 2019-12-01 NOTE — Telephone Encounter (Signed)
Patient has been notified of the Bone Density results as written by provider. Closing encounter.

## 2019-12-22 ENCOUNTER — Ambulatory Visit: Payer: Medicare HMO | Attending: Internal Medicine

## 2019-12-22 DIAGNOSIS — Z23 Encounter for immunization: Secondary | ICD-10-CM | POA: Insufficient documentation

## 2019-12-22 NOTE — Progress Notes (Signed)
   Covid-19 Vaccination Clinic  Name:  Karen Winters    MRN: [id number] DOB: 05/19/1951  12/22/2019  Ms. Karen Winters was observed post Covid-19 immunization for 15 minutes without incidence. She was provided with Vaccine Information Sheet and instruction to access the V-Safe system.   Ms. Karen Winters was instructed to call 911 with any severe reactions post vaccine: Marland Kitchen Difficulty breathing  . Swelling of your face and throat  . A fast heartbeat  . A bad rash all over your body  . Dizziness and weakness    Immunizations Administered    Name Date Dose VIS Date Route   Pfizer COVID-19 Vaccine 12/22/2019  8:43 AM 0.3 mL 11/11/2019 Intramuscular   Manufacturer: [place]   Lot: [id number]   [place]: [id number]

## 2020-01-12 ENCOUNTER — Ambulatory Visit: Payer: Medicare HMO | Attending: Internal Medicine

## 2020-01-12 DIAGNOSIS — Z23 Encounter for immunization: Secondary | ICD-10-CM

## 2020-01-12 NOTE — Progress Notes (Signed)
   Covid-19 Vaccination Clinic  Name:  Karen Winters    MRN: [id number] DOB: 01-16-1951  01/12/2020  [name] was observed post Covid-19 immunization for 15 minutes without incidence. She was provided with Vaccine Information Sheet and instruction to access the V-Safe system.   [name] was instructed to call 911 with any severe reactions post vaccine: Marland Kitchen Difficulty breathing  . Swelling of your face and throat  . A fast heartbeat  . A bad rash all over your body  . Dizziness and weakness    Immunizations Administered    Name Date Dose VIS Date Route   Pfizer COVID-19 Vaccine 01/12/2020  8:27 AM 0.3 mL 11/11/2019 Intramuscular   Manufacturer: [place]   Lot: [id number]   [place]: [id number]

## 2020-07-31 NOTE — Progress Notes (Signed)
Your procedure is scheduled on Thursday, September 2nd.  Report to [facility] Main Entrance "A" at 5:30 A.M., and check in at the Admitting office.  Call this number if you have problems the morning of surgery:  [phone]  Call [phone] if you have any questions prior to your surgery date Monday-Friday 8am-4pm   Remember:  Do not eat after midnight the night before your surgery  You may drink clear liquids until 4:30 A.M. the morning of your surgery.   Clear liquids allowed are: Water, Non-Citrus Juices (without pulp), Carbonated Beverages, Clear Tea, Black Coffee Only, and Gatorade  Enhanced Recovery after Surgery for Orthopedics Enhanced Recovery after Surgery is a protocol used to improve the stress on your body and your recovery after surgery.  Patient Instructions  . The night before surgery:  o No food after midnight. ONLY clear liquids after midnight  .  Marland Kitchen The day of surgery (if you do NOT have diabetes):  o Drink ONE (1) Pre-Surgery Clear Ensure by 4:30 A.M. the morning of surgery   o This drink was given to you during your hospital  pre-op appointment visit. o Nothing else to drink after completing the  Pre-Surgery Clear Ensure.         If you have questions, please contact your surgeon's office.    Take these medicines the morning of surgery with A SIP OF WATER   IF NEEDED: acetaminophen (TYLENOL), EPINEPHrine (EPIPEN 2-PAK), SUMAtriptan (IMITREX)    As of today, STOP taking any Aspirin (unless otherwise instructed by your surgeon) Aleve, Naproxen, Ibuprofen, Motrin, Advil, Goody's, BC's, all herbal medications, fish oil, and all vitamins.                     Do not wear jewelry, make up, or nail polish            Do not wear lotions, powders, perfumes, or deodorant.            Do not shave 48 hours prior to surgery.              Do not bring valuables to the hospital.            [facility] is not responsible for any belongings or valuables.  Do NOT Smoke  (Tobacco/Vaping) or drink Alcohol 24 hours prior to your procedure If you use a CPAP at night, you may bring all equipment for your overnight stay.   Contacts, glasses, dentures or bridgework may not be worn into surgery.      For patients admitted to the hospital, discharge time will be determined by your treatment team.   Patients discharged the day of surgery will not be allowed to drive home, and someone needs to stay with them for 24 hours.  Special instructions:   [facility]- Preparing For Surgery  Before surgery, you can play an important role. Because skin is not sterile, your skin needs to be as free of germs as possible. You can reduce the number of germs on your skin by washing with CHG (chlorahexidine gluconate) Soap before surgery.  CHG is an antiseptic cleaner which kills germs and bonds with the skin to continue killing germs even after washing.    Oral Hygiene is also important to reduce your risk of infection.  Remember - BRUSH YOUR TEETH THE MORNING OF SURGERY WITH YOUR REGULAR TOOTHPASTE  Please do not use if you have an allergy to CHG or antibacterial soaps. If your skin becomes  reddened/irritated stop using the CHG.  Do not shave (including legs and underarms) for at least 48 hours prior to first CHG shower. It is OK to shave your face.  Please follow these instructions carefully.   1. Shower the NIGHT BEFORE SURGERY and the MORNING OF SURGERY with CHG Soap.   2. If you chose to wash your hair, wash your hair first as usual with your normal shampoo.  3. After you shampoo, rinse your hair and body thoroughly to remove the shampoo.  4. Use CHG as you would any other liquid soap. You can apply CHG directly to the skin and wash gently with a scrungie or a clean washcloth.   5. Apply the CHG Soap to your body ONLY FROM THE NECK DOWN.  Do not use on open wounds or open sores. Avoid contact with your eyes, ears, mouth and genitals (private parts). Wash Face and genitals  (private parts)  with your normal soap.   6. Wash thoroughly, paying special attention to the area where your surgery will be performed.  7. Thoroughly rinse your body with warm water from the neck down.  8. DO NOT shower/wash with your normal soap after using and rinsing off the CHG Soap.  9. Pat yourself dry with a CLEAN TOWEL.  10. Wear CLEAN PAJAMAS to bed the night before surgery  11. Place CLEAN SHEETS on your bed the night of your first shower and DO NOT SLEEP WITH PETS.  Day of Surgery: Wear Clean/Comfortable clothing the morning of surgery Do not apply any deodorants/lotions.   Remember to brush your teeth WITH YOUR REGULAR TOOTHPASTE.   Please read over the following fact sheets that you were given.

## 2020-08-01 ENCOUNTER — Encounter (HOSPITAL_COMMUNITY): Payer: Self-pay

## 2020-08-01 ENCOUNTER — Other Ambulatory Visit: Payer: Self-pay

## 2020-08-01 ENCOUNTER — Encounter (HOSPITAL_COMMUNITY)
Admission: RE | Admit: 2020-08-01 | Discharge: 2020-08-01 | Disposition: A | Discharge status: Home / Self Care | Payer: Medicare HMO | Source: Ambulatory Visit | Attending: Orthopaedic Surgery | Admitting: Orthopaedic Surgery

## 2020-08-01 ENCOUNTER — Other Ambulatory Visit (HOSPITAL_COMMUNITY)
Admission: RE | Admit: 2020-08-01 | Discharge: 2020-08-01 | Disposition: A | Discharge status: Home / Self Care | Payer: Medicare HMO | Source: Ambulatory Visit | Attending: Orthopaedic Surgery | Admitting: Orthopaedic Surgery

## 2020-08-01 DIAGNOSIS — U071 COVID-19: Secondary | ICD-10-CM | POA: Insufficient documentation

## 2020-08-01 DIAGNOSIS — Z01812 Encounter for preprocedural laboratory examination: Secondary | ICD-10-CM | POA: Insufficient documentation

## 2020-08-01 HISTORY — DX: Anemia, unspecified: D64.9

## 2020-08-01 LAB — CBC
HCT: 46.1 % — ABNORMAL HIGH (ref 36.0–46.0)
Hemoglobin: 14.7 g/dL (ref 12.0–15.0)
MCH: 29.2 pg (ref 26.0–34.0)
MCHC: 31.9 g/dL (ref 30.0–36.0)
MCV: 91.5 fL (ref 80.0–100.0)
Platelets: 233 10*3/uL (ref 150–400)
RBC: 5.04 MIL/uL (ref 3.87–5.11)
RDW: 13.4 % (ref 11.5–15.5)
WBC: 7.1 10*3/uL (ref 4.0–10.5)
nRBC: 0 % (ref 0.0–0.2)

## 2020-08-01 LAB — SURGICAL PCR SCREEN
MRSA, PCR: NEGATIVE
Staphylococcus aureus: NEGATIVE

## 2020-08-01 LAB — SARS CORONAVIRUS 2 (TAT 6-24 HRS): SARS Coronavirus 2: POSITIVE — AB

## 2020-08-01 NOTE — Progress Notes (Signed)
Surgeon made aware of Covid positive test result. Stated would still need to proceed with surgery tomorrow and he would call patient and make her aware. Patient informed to call nursing secretary when she arrived for escort back to short stay and to wear mask into hospital.

## 2020-08-01 NOTE — Progress Notes (Addendum)
PCP - Eldridge Abrahams, NP Cardiologist - patient denies  PPM/ICD - n/a Device Orders -  Rep Notified -   Chest x-ray - n/a EKG - n/a Stress Test - patient denies ECHO - patient denies Cardiac Cath - patient denies  Sleep Study - patient denies CPAP -   Fasting Blood Sugar - n/a Checks Blood Sugar _____ times a day  Blood Thinner Instructions: n/a Aspirin Instructions:  ERAS Protcol - clears until 0430 PRE-SURGERY Ensure or G2- Ensure provided, complete by 0430  COVID TEST- after PAT appointment   Anesthesia review: no  Patient denies shortness of breath, fever, cough and chest pain at PAT appointment   All instructions explained to the patient, with a verbal understanding of the material. Patient agrees to go over the instructions while at home for a better understanding. Patient also instructed to self quarantine after being tested for COVID-19. The opportunity to ask questions was provided.  Contacted Dr. Shelbie Ammons office to make them aware that patient's husband is COVID positive, that patient has 1 negative test and is going through our testing drive thru this morning.

## 2020-08-02 ENCOUNTER — Encounter (HOSPITAL_COMMUNITY)
Admission: RE | Disposition: A | Discharge status: Home / Self Care | Payer: Self-pay | Source: Home / Self Care | Attending: Orthopaedic Surgery

## 2020-08-02 ENCOUNTER — Ambulatory Visit (HOSPITAL_COMMUNITY): Payer: Medicare HMO

## 2020-08-02 ENCOUNTER — Ambulatory Visit (HOSPITAL_COMMUNITY)
Admission: RE | Admit: 2020-08-02 | Discharge: 2020-08-02 | Disposition: A | Discharge status: Home / Self Care | Payer: Medicare HMO | Attending: Orthopaedic Surgery | Admitting: Orthopaedic Surgery

## 2020-08-02 ENCOUNTER — Ambulatory Visit (HOSPITAL_COMMUNITY): Payer: Medicare HMO | Admitting: Anesthesiology

## 2020-08-02 ENCOUNTER — Other Ambulatory Visit: Payer: Self-pay

## 2020-08-02 ENCOUNTER — Encounter (HOSPITAL_COMMUNITY): Payer: Self-pay | Admitting: Orthopaedic Surgery

## 2020-08-02 DIAGNOSIS — Z8349 Family history of other endocrine, nutritional and metabolic diseases: Secondary | ICD-10-CM | POA: Diagnosis not present

## 2020-08-02 DIAGNOSIS — Z8249 Family history of ischemic heart disease and other diseases of the circulatory system: Secondary | ICD-10-CM | POA: Insufficient documentation

## 2020-08-02 DIAGNOSIS — Z91018 Allergy to other foods: Secondary | ICD-10-CM | POA: Insufficient documentation

## 2020-08-02 DIAGNOSIS — Z87891 Personal history of nicotine dependence: Secondary | ICD-10-CM | POA: Insufficient documentation

## 2020-08-02 DIAGNOSIS — Z833 Family history of diabetes mellitus: Secondary | ICD-10-CM | POA: Insufficient documentation

## 2020-08-02 DIAGNOSIS — Z8051 Family history of malignant neoplasm of kidney: Secondary | ICD-10-CM | POA: Insufficient documentation

## 2020-08-02 DIAGNOSIS — S52551A Other extraarticular fracture of lower end of right radius, initial encounter for closed fracture: Secondary | ICD-10-CM | POA: Diagnosis not present

## 2020-08-02 DIAGNOSIS — M810 Age-related osteoporosis without current pathological fracture: Secondary | ICD-10-CM | POA: Diagnosis not present

## 2020-08-02 DIAGNOSIS — Z8616 Personal history of COVID-19: Secondary | ICD-10-CM | POA: Insufficient documentation

## 2020-08-02 DIAGNOSIS — Z803 Family history of malignant neoplasm of breast: Secondary | ICD-10-CM | POA: Insufficient documentation

## 2020-08-02 DIAGNOSIS — Z91048 Other nonmedicinal substance allergy status: Secondary | ICD-10-CM | POA: Diagnosis not present

## 2020-08-02 DIAGNOSIS — W010XXA Fall on same level from slipping, tripping and stumbling without subsequent striking against object, initial encounter: Secondary | ICD-10-CM | POA: Insufficient documentation

## 2020-08-02 DIAGNOSIS — Z79899 Other long term (current) drug therapy: Secondary | ICD-10-CM | POA: Diagnosis not present

## 2020-08-02 DIAGNOSIS — Z8269 Family history of other diseases of the musculoskeletal system and connective tissue: Secondary | ICD-10-CM | POA: Diagnosis not present

## 2020-08-02 DIAGNOSIS — G43909 Migraine, unspecified, not intractable, without status migrainosus: Secondary | ICD-10-CM | POA: Diagnosis not present

## 2020-08-02 DIAGNOSIS — Z880 Allergy status to penicillin: Secondary | ICD-10-CM | POA: Diagnosis not present

## 2020-08-02 DIAGNOSIS — Z91030 Bee allergy status: Secondary | ICD-10-CM | POA: Diagnosis not present

## 2020-08-02 DIAGNOSIS — Z91040 Latex allergy status: Secondary | ICD-10-CM | POA: Insufficient documentation

## 2020-08-02 HISTORY — PX: ORIF WRIST FRACTURE: SHX2133

## 2020-08-02 SURGERY — OPEN REDUCTION INTERNAL FIXATION (ORIF) WRIST FRACTURE
Anesthesia: General | Site: Wrist | Laterality: Right

## 2020-08-02 MED ORDER — PROPOFOL 10 MG/ML IV BOLUS
INTRAVENOUS | Status: DC | PRN
  Administered 2020-08-02: 130 mg via INTRAVENOUS

## 2020-08-02 MED ORDER — HYDROCODONE-ACETAMINOPHEN 5-325 MG PO TABS
ORAL_TABLET | ORAL | Status: DC
Start: 2020-08-02 — End: 2020-08-02
  Filled 2020-08-02: qty 2

## 2020-08-02 MED ORDER — FENTANYL CITRATE (PF) 250 MCG/5ML IJ SOLN
INTRAMUSCULAR | Status: DC | PRN
Start: 2020-08-02 — End: 2020-08-02
  Administered 2020-08-02 (×2): 50 ug via INTRAVENOUS

## 2020-08-02 MED ORDER — CHLORHEXIDINE GLUCONATE 4 % EX LIQD: 60.0000 mL | Freq: Once | CUTANEOUS | Status: DC

## 2020-08-02 MED ORDER — LIDOCAINE 2% (20 MG/ML) 5 ML SYRINGE
INTRAMUSCULAR | Status: DC | PRN
  Administered 2020-08-02: 60 mg via INTRAVENOUS

## 2020-08-02 MED ORDER — BUPIVACAINE HCL (PF) 0.25 % IJ SOLN
INTRAMUSCULAR | Status: AC
  Filled 2020-08-02: qty 30

## 2020-08-02 MED ORDER — HYDROCODONE-ACETAMINOPHEN 5-325 MG PO TABS
1.0000 | ORAL_TABLET | Freq: Four times a day (QID) | ORAL | 0 refills | Status: AC | PRN
Start: 2020-08-02 — End: 2020-08-07

## 2020-08-02 MED ORDER — CHLORHEXIDINE GLUCONATE 0.12 % MT SOLN: 15.0000 mL | Freq: Once | OROMUCOSAL | Status: AC

## 2020-08-02 MED ORDER — ONDANSETRON HCL 4 MG/2ML IJ SOLN
INTRAMUSCULAR | Status: DC | PRN
  Administered 2020-08-02: 4 mg via INTRAVENOUS

## 2020-08-02 MED ORDER — MIDAZOLAM HCL 5 MG/5ML IJ SOLN
INTRAMUSCULAR | Status: DC | PRN
  Administered 2020-08-02: 2 mg via INTRAVENOUS

## 2020-08-02 MED ORDER — ORAL CARE MOUTH RINSE: 15.0000 mL | Freq: Once | OROMUCOSAL | Status: AC

## 2020-08-02 MED ORDER — 0.9 % SODIUM CHLORIDE (POUR BTL) OPTIME
TOPICAL | Status: DC | PRN
  Administered 2020-08-02: 1000 mL

## 2020-08-02 MED ORDER — BUPIVACAINE-EPINEPHRINE (PF) 0.5% -1:200000 IJ SOLN
INTRAMUSCULAR | Status: DC | PRN
  Administered 2020-08-02: 25 mL via PERINEURAL

## 2020-08-02 MED ORDER — CHLORHEXIDINE GLUCONATE 0.12 % MT SOLN
OROMUCOSAL | Status: AC
  Administered 2020-08-02: 15 mL via OROMUCOSAL
  Filled 2020-08-02: qty 15

## 2020-08-02 MED ORDER — VANCOMYCIN HCL IN DEXTROSE 1-5 GM/200ML-% IV SOLN
1000.0000 mg | INTRAVENOUS | Status: AC
  Administered 2020-08-02: 1000 mg via INTRAVENOUS

## 2020-08-02 MED ORDER — EPHEDRINE SULFATE-NACL 50-0.9 MG/10ML-% IV SOSY
PREFILLED_SYRINGE | INTRAVENOUS | Status: DC | PRN
  Administered 2020-08-02 (×2): 10 mg via INTRAVENOUS

## 2020-08-02 MED ORDER — POVIDONE-IODINE 10 % EX SWAB: 2.0000 "application " | Freq: Once | CUTANEOUS | Status: DC

## 2020-08-02 MED ORDER — MIDAZOLAM HCL 2 MG/2ML IJ SOLN
INTRAMUSCULAR | Status: AC
  Filled 2020-08-02: qty 2

## 2020-08-02 MED ORDER — LACTATED RINGERS IV SOLN: INTRAVENOUS | Status: DC

## 2020-08-02 MED ORDER — FENTANYL CITRATE (PF) 250 MCG/5ML IJ SOLN
INTRAMUSCULAR | Status: AC
  Filled 2020-08-02: qty 5

## 2020-08-02 MED ORDER — DEXAMETHASONE SODIUM PHOSPHATE 10 MG/ML IJ SOLN
INTRAMUSCULAR | Status: DC | PRN
  Administered 2020-08-02: 10 mg via INTRAVENOUS

## 2020-08-02 MED ORDER — PROPOFOL 10 MG/ML IV BOLUS
INTRAVENOUS | Status: AC
  Filled 2020-08-02: qty 20

## 2020-08-02 SURGICAL SUPPLY — 58 items
ALCOHOL 70% 16 OZ (MISCELLANEOUS) IMPLANT
BIT DRILL 2.0 LNG QUCK RELEASE (BIT) ×1 IMPLANT
BIT DRILL 2.8 QUICK RELEASE (BIT) ×1 IMPLANT
BNDG ELASTIC 3X5.8 VLCR STR LF (GAUZE/BANDAGES/DRESSINGS) ×2 IMPLANT
BNDG ELASTIC 4X5.8 VLCR STR LF (GAUZE/BANDAGES/DRESSINGS) ×2 IMPLANT
BNDG ESMARK 4X9 LF (GAUZE/BANDAGES/DRESSINGS) ×2 IMPLANT
BNDG GAUZE ELAST 4 BULKY (GAUZE/BANDAGES/DRESSINGS) ×2 IMPLANT
CANISTER SUCT 3000ML PPV (MISCELLANEOUS) ×2 IMPLANT
CHLORAPREP W/TINT 26 (MISCELLANEOUS) IMPLANT
CORD BIPOLAR FORCEPS 12FT (ELECTRODE) ×2 IMPLANT
COVER SURGICAL LIGHT HANDLE (MISCELLANEOUS) ×2 IMPLANT
COVER WAND RF STERILE (DRAPES) IMPLANT
CUFF TOURN SGL QUICK 18X4 (TOURNIQUET CUFF) ×2 IMPLANT
CUFF TOURN SGL QUICK 24 (TOURNIQUET CUFF)
CUFF TRNQT CYL 24X4X16.5-23 (TOURNIQUET CUFF) IMPLANT
DRAPE OEC MINIVIEW 54X84 (DRAPES) ×2 IMPLANT
DRAPE SURG 17X23 STRL (DRAPES) ×2 IMPLANT
DRILL 2.0 LNG QUICK RELEASE (BIT) ×2
DRILL 2.8 QUICK RELEASE (BIT) ×2
DRSG XEROFORM 1X8 (GAUZE/BANDAGES/DRESSINGS) ×2 IMPLANT
GAUZE SPONGE 4X4 12PLY STRL (GAUZE/BANDAGES/DRESSINGS) ×2 IMPLANT
GAUZE SPONGE 4X4 12PLY STRL LF (GAUZE/BANDAGES/DRESSINGS) ×2 IMPLANT
GAUZE XEROFORM 1X8 LF (GAUZE/BANDAGES/DRESSINGS) IMPLANT
GLOVE INDICATOR 8.0 STRL GRN (GLOVE) ×2 IMPLANT
GLOVE SURG SYN 7.5  E (GLOVE) ×2
GLOVE SURG SYN 7.5 E (GLOVE) ×1 IMPLANT
GOWN STRL REUS W/ TWL LRG LVL3 (GOWN DISPOSABLE) ×2 IMPLANT
GOWN STRL REUS W/TWL LRG LVL3 (GOWN DISPOSABLE) ×4
GUIDEWIRE ORTHO 0.054X6 (WIRE) ×2 IMPLANT
KIT BASIN OR (CUSTOM PROCEDURE TRAY) ×2 IMPLANT
KIT TURNOVER KIT B (KITS) ×2 IMPLANT
NEEDLE 22X1 1/2 (OR ONLY) (NEEDLE) IMPLANT
NS IRRIG 1000ML POUR BTL (IV SOLUTION) ×2 IMPLANT
PACK ORTHO EXTREMITY (CUSTOM PROCEDURE TRAY) ×2 IMPLANT
PAD ABD 8X10 STRL (GAUZE/BANDAGES/DRESSINGS) ×2 IMPLANT
PAD ARMBOARD 7.5X6 YLW CONV (MISCELLANEOUS) ×4 IMPLANT
PAD CAST 3X4 CTTN HI CHSV (CAST SUPPLIES) ×1 IMPLANT
PAD CAST 4YDX4 CTTN HI CHSV (CAST SUPPLIES) ×1 IMPLANT
PADDING CAST COTTON 3X4 STRL (CAST SUPPLIES) ×2
PADDING CAST COTTON 4X4 STRL (CAST SUPPLIES) ×2
PLATE R NARROW PROC VDR (Plate) ×2 IMPLANT
SCREW BN FT 16X2.3XLCK HEX CRT (Screw) ×1 IMPLANT
SCREW CORT FT 18X2.3XLCK HEX (Screw) ×5 IMPLANT
SCREW CORTICAL LOCKING 2.3X16M (Screw) ×2 IMPLANT
SCREW CORTICAL LOCKING 2.3X18M (Screw) ×10 IMPLANT
SCREW LOCK 12X3.5X HEXALOBE (Screw) ×1 IMPLANT
SCREW LOCKING 3.5X10MM (Screw) ×2 IMPLANT
SCREW LOCKING 3.5X12 (Screw) ×2 IMPLANT
SCREW NONLOCK HEX 3.5X12 (Screw) ×2 IMPLANT
SPLINT FIBERGLASS 3X12 (CAST SUPPLIES) ×2 IMPLANT
SUT PROLENE 4 0 PS 2 18 (SUTURE) ×2 IMPLANT
SUT VIC AB 3-0 PS2 18 (SUTURE) IMPLANT
SYR CONTROL 10ML LL (SYRINGE) IMPLANT
TOWEL GREEN STERILE (TOWEL DISPOSABLE) ×2 IMPLANT
TOWEL GREEN STERILE FF (TOWEL DISPOSABLE) ×2 IMPLANT
TUBE CONNECTING 12X1/4 (SUCTIONS) ×2 IMPLANT
UNDERPAD 30X36 HEAVY ABSORB (UNDERPADS AND DIAPERS) ×2 IMPLANT
WATER STERILE IRR 1000ML POUR (IV SOLUTION) ×2 IMPLANT

## 2020-08-02 NOTE — Anesthesia Procedure Notes (Signed)
Procedure Name: LMA Insertion Date/Time: 08/02/2020 8:00 AM Performed by: Myna Bright, CRNA Pre-anesthesia Checklist: Patient identified, Emergency Drugs available, Suction available and Patient being monitored Patient Re-evaluated:Patient Re-evaluated prior to induction Oxygen Delivery Method: Circle system utilized Preoxygenation: Pre-oxygenation with 100% oxygen Induction Type: IV induction Ventilation: Mask ventilation without difficulty LMA: LMA inserted LMA Size: 4.0 Number of attempts: 1 Placement Confirmation: positive ETCO2 and breath sounds checked- equal and bilateral Tube secured with: Tape Dental Injury: Teeth and Oropharynx as per pre-operative assessment

## 2020-08-02 NOTE — Anesthesia Preprocedure Evaluation (Signed)
Anesthesia Evaluation  Patient identified by MRN, date of birth, ID band Patient awake    Reviewed: Allergy & Precautions, H&P , NPO status , Patient's Chart, lab work & pertinent test results  Airway Mallampati: II   Neck ROM: full    Dental   Pulmonary former smoker,  COVID+ (08/01/20)   breath sounds clear to auscultation       Cardiovascular negative cardio ROS   Rhythm:regular Rate:Normal     Neuro/Psych  Headaches,    GI/Hepatic   Endo/Other    Renal/GU      Musculoskeletal   Abdominal   Peds  Hematology   Anesthesia Other Findings   Reproductive/Obstetrics                             Anesthesia Physical Anesthesia Plan  ASA: II  Anesthesia Plan: General   Post-op Pain Management:  Regional for Post-op pain   Induction: Intravenous  PONV Risk Score and Plan: 3 and Ondansetron, Dexamethasone, Midazolam and Treatment may vary due to age or medical condition  Airway Management Planned: LMA  Additional Equipment:   Intra-op Plan:   Post-operative Plan: Extubation in OR  Informed Consent: I have reviewed the patients History and Physical, chart, labs and discussed the procedure including the risks, benefits and alternatives for the proposed anesthesia with the patient or authorized representative who has indicated his/her understanding and acceptance.       Plan Discussed with: CRNA, Anesthesiologist and Surgeon  Anesthesia Plan Comments:         Anesthesia Quick Evaluation

## 2020-08-02 NOTE — Transfer of Care (Signed)
Immediate Anesthesia Transfer of Care Note  Patient: Karen Winters  Procedure(s) Performed: Right distal radius open reduction internal fixation (Right Wrist)  Patient Location: PACU  Anesthesia Type:General  Level of Consciousness: awake, alert , oriented and patient cooperative  Airway & Oxygen Therapy: Patient Spontanous Breathing  Post-op Assessment: Report given to RN and Post -op Vital signs reviewed and stable  Post vital signs: Reviewed and stable  Last Vitals:  Vitals Value Taken Time  BP    Temp    Pulse    Resp    SpO2      Last Pain:  Vitals:   08/02/20 0641  TempSrc: Oral  PainSc: 0-No pain         Complications: No complications documented.

## 2020-08-02 NOTE — Anesthesia Procedure Notes (Signed)
Anesthesia Regional Block: Supraclavicular block   Pre-Anesthetic Checklist: ,, timeout performed, Correct Patient, Correct Site, Correct Laterality, Correct Procedure, Correct Position, site marked, Risks and benefits discussed,  Surgical consent,  Pre-op evaluation,  At surgeon's request and post-op pain management  Laterality: Right  Prep: chloraprep       Needles:  Injection technique: Single-shot  Needle Type: Echogenic Stimulator Needle     Needle Length: 5cm  Needle Gauge: 22     Additional Needles:   Procedures:, nerve stimulator,,,,,,,   Nerve Stimulator or Paresthesia:  Response: biceps flexion, 0.45 mA,   Additional Responses:   Narrative:  Start time: 08/02/2020 7:13 AM End time: 08/02/2020 7:26 AM Injection made incrementally with aspirations every 5 mL.  Performed by: Personally  Anesthesiologist: Albertha Ghee, MD  Additional Notes: Functioning IV was confirmed and monitors were applied.  A 73mm 22ga Arrow echogenic stimulator needle was used. Sterile prep and drape,hand hygiene and sterile gloves were used.  Negative aspiration and negative test dose prior to incremental administration of local anesthetic. The patient tolerated the procedure well.  Ultrasound guidance: relevent anatomy identified, needle position confirmed, local anesthetic spread visualized around nerve(s), vascular puncture avoided.  Image printed for medical record.

## 2020-08-02 NOTE — H&P (Signed)
ORTHOPAEDIC H&P  PCP:  Berkley Harvey, NP  Chief Complaint: Right distal radius fracture  HPI: Karen Winters is a 69 y.o. female who complains of right distal radius fracture.  She was on a rafting trip approximately 3 weeks ago when she tripped and fell onto an outstretched right arm.  She had immediate pain and swelling of the wrist and was seen at outside facility in [place].  At that time she was found to have an angulated and displaced right distal radius fracture.  She was placed into a splint and then seen by me in clinic.  She presented to my clinic currently 2 weeks out from her injury where she showed persistent angulation of the articular surface of her distal radius.  We discussed both operative and nonoperative treatment measures and we did agree to proceed forward with open reduction and internal fixation of the right distal radius.  Initially her husband the testing came positive so her surgery which was scheduled earlier this week is delayed until today.  She then tested positive on her preoperative screening.  Despite that though we agreed to proceed forward with fixation of her distal radius that she is now over 3 weeks out from her injury.  Past Medical History:  Diagnosis Date  . Anemia   . Endometriosis   . Migraines   . Osteoporosis    Past Surgical History:  Procedure Laterality Date  . [place]   and BTSP  . COLONOSCOPY    . LAPAROSCOPY     for infertility-endometriosis  . LAPAROSCOPY     2nd for infertility  . mole removed     precancerous mole right upper abd  . TONSILLECTOMY AND ADENOIDECTOMY     Social History   Socioeconomic History  . Marital status: Married    Spouse name: Not on file  . Number of children: Not on file  . Years of education: Not on file  . Highest education level: Not on file  Occupational History  . Not on file  Tobacco Use  . Smoking status: Former Smoker    Quit date: 12/02/1983    Years since  quitting: 36.6  . Smokeless tobacco: Never Used  . Tobacco comment: quit 1985  Vaping Use  . Vaping Use: Never used  Substance and Sexual Activity  . Alcohol use: Yes    Alcohol/week: 0.0 standard drinks    Comment: ocassional  . Drug use: No  . Sexual activity: Yes    Partners: Male    Birth control/protection: Surgical, Post-menopausal    Comment: BTL  Other Topics Concern  . Not on file  Social History Narrative  . Not on file   Social Determinants of Health   Financial Resource Strain:   . Difficulty of Paying Living Expenses: Not on file  Food Insecurity:   . Worried About Charity fundraiser in the Last Year: Not on file  . Ran Out of Food in the Last Year: Not on file  Transportation Needs:   . Lack of Transportation (Medical): Not on file  . Lack of Transportation (Non-Medical): Not on file  Physical Activity:   . Days of Exercise per Week: Not on file  . Minutes of Exercise per Session: Not on file  Stress:   . Feeling of Stress : Not on file  Social Connections:   . Frequency of Communication with Friends and Family: Not on file  . Frequency of Social Gatherings with  Friends and Family: Not on file  . Attends Religious Services: Not on file  . Active Member of Clubs or Organizations: Not on file  . Attends Archivist Meetings: Not on file  . Marital Status: Not on file   Family History  Problem Relation Age of Onset  . Diabetes Maternal Grandmother   . Breast cancer Maternal Grandmother   . Renal cancer Father   . Hypertension Sister   . Hypertension Brother   . Heart attack Paternal Grandfather   . Hypothyroidism Sister   . Other Sister        Wegener's syndrome   Allergies  Allergen Reactions  . Bee Venom Swelling  . Latex Other (See Comments)    Redness, itching  . Mushroom Extract Complex Nausea And Vomiting    Diarrhea  . Penicillins Other (See Comments)    Hives Reaction:  69 years old  . Tape Itching    Redness   Prior to  Admission medications   Medication Sig Start Date End Date Taking? Authorizing Provider  acetaminophen (TYLENOL) 325 MG tablet Take 650 mg by mouth every 6 (six) hours as needed for mild pain.    Yes [provider]  Calcium-Magnesium-Vitamin D (CALCIUM 500 PO) Take 500 mg by mouth daily.    Yes [provider]  EPINEPHrine (EPIPEN 2-PAK) 0.3 mg/0.3 mL IJ SOAJ injection Inject 0.3 mg as directed as needed (allergic reaction).    Yes [provider]  ibuprofen (ADVIL) 200 MG tablet Take 400 mg by mouth every 6 (six) hours as needed for mild pain or moderate pain.   Yes [provider]  Misc Natural Products (OSTEO BI-FLEX JOINT SHIELD PO) Take 1 tablet by mouth daily.    Yes [provider]  Multiple Vitamins-Minerals (MULTIVITAMIN PO) Take 1 tablet by mouth daily.    Yes [provider]  SUMAtriptan (IMITREX) 100 MG tablet 1 tab with headache onset.  Can repeat in 2 hours.  Max dosage 200mg /24 hours. Patient taking differently: Take 100 mg by mouth every 2 (two) hours as needed. 100 mg tab with headache onset.  Can repeat in 2 hours.  Max dosage 200mg /24 hours. 07/12/18   Megan Salon, MD   No results found.  Positive ROS: All other systems have been reviewed and were otherwise negative with the exception of those mentioned in the HPI and as above.  Physical Exam: General: Alert, no acute distress Cardiovascular: No edema Respiratory: No cyanosis, no use of accessory musculature Skin: No lesions in the area of chief complaint  Psychiatric: Patient is competent for consent with normal mood and affect  MUSCULOSKELETAL: Volar slab splint is in place.  Exposed digits have some mild swelling but she has intact flexion and extension throughout all fingers.  Her fingertips are warm well perfused with brisk capillary refill.  Her sensation is grossly intact light touch throughout all digits.  Assessment: Displaced right distal radius  fracture  Plan: Plan to proceed forward with open reduction and internal fixation of her right distal radius fracture.  Risks of surgery were once again discussed with the patient today.  These risks include but are not limited to infection, bleeding, damage to surrounding structures including blood vessels and nerves, pain, stiffness, malunion, nonunion, implant failure and need for additional procedures.  Informed consent was obtained at that time the patient's right wrist was marked with a surgical marking pen.  The patient is Covid positive but asymptomatic.  As she is already  3 and half weeks out from her injury I do think it is prudent to go ahead and proceed forward with open reduction and internal fixation.  Delay in her care approximately 10 days following her positive test could significantly impede the ability to achieve an adequate fixation and alignment of her wrist fracture.  Patient understands the risks of proceeding forward and she does agree as well.  Follow-up with me in approximately 10 to 14 days postop to begin occupational therapy.    Verner Mould, MD [phone]   08/02/2020 7:17 AM

## 2020-08-02 NOTE — Discharge Instructions (Signed)
Discharge Instructions  - Keep dressings in place. Do not remove them. - The dressings must stay dry - Take all medication as prescribed. Transition to over the counter pain medication as your pain improves - Keep the hand elevated over the next 48-72 hours to help with pain and swelling - Move all digits not restricted by the dressings regularly to prevent stiffness - Please call to schedule a follow up appointment with Dr. Traci Plemons and therapy at [phone] for 10-14 days following surgery - Your pain medication have been sent digitally to your pharmacy  

## 2020-08-02 NOTE — Op Note (Signed)
PREOPERATIVE DIAGNOSIS: Right displaced extra-articular distal radius fracture  POSTOPERATIVE DIAGNOSIS: Same  ATTENDING PHYSICIAN: Maudry Mayhew. Jeannie Fend, III, MD who was present and scrubbed for the entire case   ASSISTANT SURGEON: None.   ANESTHESIA: Regional with general  SURGICAL PROCEDURES: Open reduction and internal fixation of right distal radius extra-articular fracture  SURGICAL INDICATIONS: Patient is a 69 year old female who was seen and evaluated by me in clinic.  Approximately 3-1/2 weeks ago she was in [place] on a rafting trip.  She slipped and fell onto an outstretched right arm.  She had immediate pain and swelling to the wrist and was seen in the facility in [place].  She was found to have an impacted and dorsally angulated right distal radius fracture.  She was subsequently placed into a splint and then sent to see me in clinic.  I saw her 2 weeks out from her injury where she had approximately 30 degrees of dorsal angulation of the articular surface as well as loss of articular height and radial inclination.  Because this we discussed both operative and nonoperative treatment measures.  She did elect to proceed forward with operative fixation of her right wrist.  Unfortunately her husband tested positive for Covid so her initial surgery day earlier this week was delayed until today.  She then was found to be positive for COVID-19 on her preoperative screening.  Because she was now nearly 3-1/2 weeks out from her initial date of injury I did discuss with her that I recommend proceeding forward with fixation of her wrist as further delay would make it more challenging to achieve an adequate reduction as well as stable fixation of the wrist.  We discussed the the risks of surgery extensively and she did agree to proceed and presents today for that.  FINDING: There is extra-articular dorsally angulated and impacted right distal radius fracture.  Near-anatomic alignment was achieved and  stable fixation achieved with a volar locking plate and screw construct.  DESCRIPTION OF PROCEDURE: Patient was identified in the preoperative holding area where the risk benefits and alternatives of the procedure were discussed with the patient.  These include but are not limited to infection, bleeding, damage to surrounding structures including blood vessels and nerves, pain, stiffness, malunion, nonunion, implant failure need for additional procedures.  Informed consent was obtained at that time the patient's right wrist was marked with a surgical marking pen.  She then underwent a right upper extremity plexus block by anesthesia.  She was brought to the operative suite where a timeout was performed identifying the correct patient operative site.  She was positioned supine on the operative table with her hand outstretched on a hand table.  Tourniquet was placed on the upper arm.  She was induced under general LMA anesthesia.  The right upper extremity was then prepped with a Betadine paint and scrub and then sterilely draped.  The limb was exsanguinated and the tourniquet was inflated.  A standard volar approach to the distal radius was utilized using FCR approach.  Incision was made over the volar wrist and the FCR tendon was mobilized ulnarly.  The fascia under the FCR tendon was then incised and the FPL muscle and tendon were mobilized ulnarly as well.  This revealed the pronator quadratus on the volar distal radius.  It was incised and elevated off the volar distal radius using a wood handle elevator.  This showed the distal radius fracture along the volar distal radius.  There was some early callus formation around  the fracture site which was debrided with a rondure.  An osteotome was then placed within the fracture and used to gently and carefully mobilized the distal segment and elevated up from its impacted and shorted position.  Fluoroscopic images were obtained which showed significant provement in  the alignment in both the AP and lateral planes with restoration of the articular height, radial inclination as well as dorsal tilt.  At this point a K wire was placed to the radial styloid and advanced across the fracture site and exiting out the distal radius proximally.  This helped to provide some temporary initial stabilization.  At this point the Acumed, narrow width proximally fitting distal radius volar locking plate was then pinned into position.  This was confirmed to be in appropriate position on both the PA and lateral planes.  A kickstand technique was utilized to help correct some residual dorsal angulation of the articular surface.  A clamp was placed to reduce the plate down to the volar cortex of the bone distally.  Multiple locking screws were placed through the distal aspect of the plate.  Once the distal row had been filled, the proximal K wire in the plate was removed and the kickstand was removed.  This allowed for reduction of the plate down to the volar distal radius proximally.  A cortical screw was placed within the oblong hole of the plate further reducing the plate down to the bone and a securing the plate in appropriate position.  Fluoroscopic images were again obtained which showed near anatomic alignment of the fracture in both the AP and lateral planes with correction of the articular height, radial inclination as well as approximately 5 to 10 degrees of volar tilt of the distal radius articular surface.  At this point 2 additional locking screws were placed through the plate proximally.  All K wires were removed at this point.  Multiple fluoroscopic images were again obtained which showed near-anatomic alignment of the articular surface with appropriate plate and screw construct with extra-articular screw and appropriate screw lengths throughout the construct.  The wrist was taken through series of range of motion and she had smooth unrestricted flexion, extension, pronation and  supination to the wrist.  Additionally the DRUJ was stressed in both pronation and supination and found to be stable.  At this point the wound was copiously irrigated with normal saline.  Skin was closed with interrupted 4-0 Prolene sutures.  Xeroform, 4 x 4's and a well-padded volar slab splint were then placed.  The tourniquet was released and patient had return of brisk capillary refill to all of her digits.  She was extubated in the operating room and she recovered in in the operating room secondary to her COVID-19 positive status.  RADIOGRAPHIC INTERPRETATION: AP, lateral and fossa lateral fluoroscopic images were obtained intraoperative.  This shows near-anatomic alignment of the previously impacted and dorsally angulated distal radius fracture.  Volar locking plate and screw construct are in place with appropriate positioning and screw lengths throughout.  ESTIMATED BLOOD LOSS: Less than 10 mL  TOURNIQUET TIME: 37 minutes  SPECIMENS: None  POSTOPERATIVE PLAN: The patient will be discharged home and seen back  in the office in approximately 10-12 days for wound check, suture  removal, and then be sent to a therapist for volar wrist splint as well as early range of motion of the digits and wrist.  IMPLANTS: Acumed narrow width, proximally fitting volar locking plate screw construct.

## 2020-08-03 ENCOUNTER — Encounter (HOSPITAL_COMMUNITY): Payer: Self-pay | Admitting: Orthopaedic Surgery

## 2020-08-03 NOTE — Anesthesia Postprocedure Evaluation (Signed)
Anesthesia Post Note  Patient: Karen Winters  Procedure(s) Performed: Right distal radius open reduction internal fixation (Right Wrist)     Patient location during evaluation: PACU Anesthesia Type: General Level of consciousness: awake and alert Pain management: pain level controlled Vital Signs Assessment: post-procedure vital signs reviewed and stable Respiratory status: spontaneous breathing, nonlabored ventilation, respiratory function stable and patient connected to nasal cannula oxygen Cardiovascular status: blood pressure returned to baseline and stable Postop Assessment: no apparent nausea or vomiting Anesthetic complications: no   No complications documented.  Last Vitals:  Vitals:   08/02/20 0641 08/02/20 0930  BP: (!) 141/85 119/64  Pulse: 61 69  Resp: 18 16  Temp: (!) 36.3 C 36.5 C  SpO2: 99% 98%    Last Pain:  Vitals:   08/02/20 0930  TempSrc:   PainSc: 0-No pain                 Jacqualine Weichel S

## 2021-01-31 ENCOUNTER — Ambulatory Visit: Payer: Medicare HMO

## 2021-04-02 ENCOUNTER — Ambulatory Visit (HOSPITAL_BASED_OUTPATIENT_CLINIC_OR_DEPARTMENT_OTHER): Payer: Medicare HMO | Admitting: Obstetrics & Gynecology

## 2021-04-03 ENCOUNTER — Other Ambulatory Visit (HOSPITAL_COMMUNITY)
Admission: RE | Admit: 2021-04-03 | Discharge: 2021-04-03 | Disposition: A | Discharge status: Home / Self Care | Payer: Medicare HMO | Source: Ambulatory Visit | Attending: Obstetrics & Gynecology | Admitting: Obstetrics & Gynecology

## 2021-04-03 ENCOUNTER — Ambulatory Visit (INDEPENDENT_AMBULATORY_CARE_PROVIDER_SITE_OTHER): Payer: Medicare HMO | Admitting: Obstetrics & Gynecology

## 2021-04-03 ENCOUNTER — Encounter (HOSPITAL_BASED_OUTPATIENT_CLINIC_OR_DEPARTMENT_OTHER): Payer: Self-pay | Admitting: Obstetrics & Gynecology

## 2021-04-03 ENCOUNTER — Other Ambulatory Visit: Payer: Self-pay

## 2021-04-03 VITALS — BP 161/91 | HR 72 | Ht 64.25 in | Wt 134.0 lb

## 2021-04-03 DIAGNOSIS — Z124 Encounter for screening for malignant neoplasm of cervix: Secondary | ICD-10-CM | POA: Insufficient documentation

## 2021-04-03 DIAGNOSIS — I10 Essential (primary) hypertension: Secondary | ICD-10-CM

## 2021-04-03 DIAGNOSIS — M810 Age-related osteoporosis without current pathological fracture: Secondary | ICD-10-CM

## 2021-04-03 DIAGNOSIS — Z8742 Personal history of other diseases of the female genital tract: Secondary | ICD-10-CM

## 2021-04-03 DIAGNOSIS — N393 Stress incontinence (female) (male): Secondary | ICD-10-CM | POA: Diagnosis not present

## 2021-04-03 DIAGNOSIS — Z780 Asymptomatic menopausal state: Secondary | ICD-10-CM

## 2021-04-03 DIAGNOSIS — Z01419 Encounter for gynecological examination (general) (routine) without abnormal findings: Secondary | ICD-10-CM | POA: Diagnosis not present

## 2021-04-03 DIAGNOSIS — N852 Hypertrophy of uterus: Secondary | ICD-10-CM

## 2021-04-03 NOTE — Progress Notes (Signed)
70 y.o. G2P2 Married White or Caucasian female here for breast and pelvic exam. BP is elevated here today but was normal at home.  She takes it at home from time to time.  Was 112/67.  Highest pt has seen at home was 133/83.  Reports it's always high at doctor's offices.  Has umbilical hernia that is bothering her.  Has been referred to general surgeon in [place].   Separately, she's having some issues with leaking urine when she runs.  Has been a runner for years.    Denies vaginal bleeding.  Patient's last menstrual period was 10/31/2008.          Sexually active: Yes.    H/O STD:  no  Health Maintenance: PCP:  Eldridge Abrahams.  Last wellness appt was 01/2021.  Did blood work at that appt:  Yes.  Reviewed note and lab work from that visit. Vaccines are up to date:  yes Colonoscopy:  2016, follow up 10 years MMG:  08/2020 BMD:  08/2020 Last pap smear:  03/2017   H/o abnormal pap smear:  no   reports that she quit smoking about 37 years ago. She has never used smokeless tobacco. She reports current alcohol use. She reports that she does not use drugs.  Past Medical History:  Diagnosis Date  . Anemia   . Endometriosis   . Migraines   . Osteoporosis     Past Surgical History:  Procedure Laterality Date  . [place]   and BTSP  . COLONOSCOPY    . LAPAROSCOPY     for infertility-endometriosis  . LAPAROSCOPY     2nd for infertility  . mole removed     precancerous mole right upper abd  . ORIF WRIST FRACTURE Right 08/02/2020   Procedure: Right distal radius open reduction internal fixation;  Surgeon: Verner Mould, MD;  Location: [facility];  Service: Orthopedics;  Laterality: Right;  78min  . TONSILLECTOMY AND ADENOIDECTOMY      Current Outpatient Medications  Medication Sig Dispense Refill  . acetaminophen (TYLENOL) 325 MG tablet Take 650 mg by mouth every 6 (six) hours as needed for mild pain.     . Calcium-Magnesium-Vitamin D (CALCIUM 500 PO) Take 500  mg by mouth daily.     Marland Kitchen EPINEPHrine 0.3 mg/0.3 mL IJ SOAJ injection Inject 0.3 mg as directed as needed (allergic reaction).     Marland Kitchen ibuprofen (ADVIL) 200 MG tablet Take 400 mg by mouth every 6 (six) hours as needed for mild pain or moderate pain.    . Misc Natural Products (OSTEO BI-FLEX JOINT SHIELD PO) Take 1 tablet by mouth daily.     . Multiple Vitamins-Minerals (MULTIVITAMIN PO) Take 1 tablet by mouth daily.     . SUMAtriptan (IMITREX) 100 MG tablet 1 tab with headache onset.  Can repeat in 2 hours.  Max dosage 200mg /24 hours. (Patient taking differently: Take 100 mg by mouth every 2 (two) hours as needed. 100 mg tab with headache onset.  Can repeat in 2 hours.  Max dosage 200mg /24 hours.) 27 tablet 4   No current facility-administered medications for this visit.    Family History  Problem Relation Age of Onset  . Diabetes Maternal Grandmother   . Breast cancer Maternal Grandmother   . Renal cancer Father   . Hypertension Sister   . Hypertension Brother   . Heart attack Paternal Grandfather   . Hypothyroidism Sister   . Other Sister  Wegener's syndrome    Review of Systems  All other systems reviewed and are negative.   Exam:   BP (!) 161/91   Pulse 72   Ht 5' 4.25" (1.632 m)   Wt 134 lb (60.8 kg)   LMP 10/31/2008   BMI 22.82 kg/m   Height: 5' 4.25" (163.2 cm)  General appearance: alert, cooperative and appears stated age Breasts: normal appearance, no masses or tenderness Abdomen: soft, non-tender; bowel sounds normal; no masses,  no organomegaly Lymph nodes: Cervical, supraclavicular, and axillary nodes normal.  No abnormal inguinal nodes palpated Neurologic: Grossly normal  Pelvic: External genitalia:  no lesions              Urethra:  normal appearing urethra with no masses, tenderness or lesions              Bartholins and Skenes: normal                 Vagina: normal appearing vagina with atrophic changes and no discharge, no lesions              Cervix:  no lesions              Pap taken: Yes.   Bimanual Exam:  Uterus:  normal size, contour, position, consistency, mobility, non-tender              Adnexa: normal adnexa and no mass, fullness, tenderness               Rectovaginal: Confirms               Anus:  normal sphincter tone, no lesions  Chaperone, Prince Rome, CMA, was present for exam.  Assessment/Plan: 1. Encntr for gyn exam (general) (routine) w/o abn findings - pap obtained today - colonoscopy and MMG are up to date - labs reviewed - vaccines updated  2. White coat syndrome with hypertension - pt monitors at home  3. Stress incontinence - Ambulatory referral to Physical Therapy  4. Postmenopausal - no HRT  5. Age-related osteoporosis without current pathological fracture - declined medication.  Plan to repeat in 2023  6. History of endometriosis  7.  Enlarged uterus - US Gyn complete ordered

## 2021-04-04 ENCOUNTER — Other Ambulatory Visit (HOSPITAL_BASED_OUTPATIENT_CLINIC_OR_DEPARTMENT_OTHER): Payer: Self-pay | Admitting: Obstetrics & Gynecology

## 2021-04-04 ENCOUNTER — Ambulatory Visit (HOSPITAL_BASED_OUTPATIENT_CLINIC_OR_DEPARTMENT_OTHER)
Admission: RE | Admit: 2021-04-04 | Discharge: 2021-04-04 | Disposition: A | Discharge status: Home / Self Care | Payer: Medicare HMO | Source: Ambulatory Visit | Attending: Obstetrics & Gynecology | Admitting: Obstetrics & Gynecology

## 2021-04-04 DIAGNOSIS — N852 Hypertrophy of uterus: Secondary | ICD-10-CM

## 2021-04-04 DIAGNOSIS — D4959 Neoplasm of unspecified behavior of other genitourinary organ: Secondary | ICD-10-CM

## 2021-04-04 LAB — CYTOLOGY - PAP: Diagnosis: NEGATIVE

## 2021-04-05 ENCOUNTER — Ambulatory Visit (HOSPITAL_BASED_OUTPATIENT_CLINIC_OR_DEPARTMENT_OTHER)
Admission: RE | Admit: 2021-04-05 | Discharge: 2021-04-05 | Disposition: A | Discharge status: Home / Self Care | Payer: Medicare HMO | Source: Ambulatory Visit | Attending: Obstetrics & Gynecology | Admitting: Obstetrics & Gynecology

## 2021-04-05 ENCOUNTER — Telehealth (HOSPITAL_BASED_OUTPATIENT_CLINIC_OR_DEPARTMENT_OTHER): Payer: Self-pay | Admitting: *Deleted

## 2021-04-05 ENCOUNTER — Other Ambulatory Visit: Payer: Self-pay

## 2021-04-05 ENCOUNTER — Other Ambulatory Visit (HOSPITAL_BASED_OUTPATIENT_CLINIC_OR_DEPARTMENT_OTHER)
Admission: RE | Admit: 2021-04-05 | Discharge: 2021-04-05 | Disposition: A | Discharge status: Home / Self Care | Payer: Medicare HMO | Source: Ambulatory Visit | Attending: Obstetrics & Gynecology | Admitting: Obstetrics & Gynecology

## 2021-04-05 ENCOUNTER — Other Ambulatory Visit (HOSPITAL_BASED_OUTPATIENT_CLINIC_OR_DEPARTMENT_OTHER): Payer: Self-pay | Admitting: Obstetrics & Gynecology

## 2021-04-05 ENCOUNTER — Telehealth: Payer: Self-pay | Admitting: *Deleted

## 2021-04-05 DIAGNOSIS — D4959 Neoplasm of unspecified behavior of other genitourinary organ: Secondary | ICD-10-CM | POA: Insufficient documentation

## 2021-04-05 DIAGNOSIS — R1900 Intra-abdominal and pelvic swelling, mass and lump, unspecified site: Secondary | ICD-10-CM | POA: Diagnosis not present

## 2021-04-05 MED ORDER — IOHEXOL 300 MG/ML  SOLN
100.0000 mL | Freq: Once | INTRAMUSCULAR | Status: AC | PRN
  Administered 2021-04-05: 80 mL via INTRAVENOUS

## 2021-04-05 NOTE — Telephone Encounter (Signed)
Dr Sabra Heck called and scheduled the patient's appt

## 2021-04-05 NOTE — Telephone Encounter (Signed)
Pt informed that request for CT of abdomen/pelvis had been approved by her insurance. Approval # [id number].  Advised that an appt had been made for her today at 2:30. Advised that she should not eat or drink anything 4 hours prior to the procedure. Advised that she would need to come at 12:30 to pick up contrast and drink, as this has to be done 2 hours prior to procedure. Pt verbalized understanding.

## 2021-04-06 LAB — CA 125: Cancer Antigen (CA) 125: 826 U/mL — ABNORMAL HIGH (ref 0.0–38.1)

## 2021-04-09 ENCOUNTER — Inpatient Hospital Stay: Payer: Medicare HMO

## 2021-04-09 ENCOUNTER — Other Ambulatory Visit: Payer: Self-pay

## 2021-04-09 ENCOUNTER — Inpatient Hospital Stay: Payer: Medicare HMO | Attending: Gynecologic Oncology | Admitting: Gynecologic Oncology

## 2021-04-09 ENCOUNTER — Encounter: Payer: Self-pay | Admitting: Oncology

## 2021-04-09 ENCOUNTER — Encounter: Payer: Self-pay | Admitting: Gynecologic Oncology

## 2021-04-09 VITALS — BP 152/85 | HR 66 | Temp 97.6°F | Resp 18 | Wt 132.0 lb

## 2021-04-09 DIAGNOSIS — Z87891 Personal history of nicotine dependence: Secondary | ICD-10-CM | POA: Insufficient documentation

## 2021-04-09 DIAGNOSIS — D398 Neoplasm of uncertain behavior of other specified female genital organs: Secondary | ICD-10-CM | POA: Insufficient documentation

## 2021-04-09 DIAGNOSIS — Z5111 Encounter for antineoplastic chemotherapy: Secondary | ICD-10-CM | POA: Insufficient documentation

## 2021-04-09 DIAGNOSIS — R180 Malignant ascites: Secondary | ICD-10-CM | POA: Insufficient documentation

## 2021-04-09 DIAGNOSIS — N9489 Other specified conditions associated with female genital organs and menstrual cycle: Secondary | ICD-10-CM

## 2021-04-09 DIAGNOSIS — R971 Elevated cancer antigen 125 [CA 125]: Secondary | ICD-10-CM | POA: Diagnosis not present

## 2021-04-09 DIAGNOSIS — C801 Malignant (primary) neoplasm, unspecified: Secondary | ICD-10-CM | POA: Insufficient documentation

## 2021-04-09 DIAGNOSIS — R194 Change in bowel habit: Secondary | ICD-10-CM | POA: Diagnosis not present

## 2021-04-09 DIAGNOSIS — R630 Anorexia: Secondary | ICD-10-CM | POA: Insufficient documentation

## 2021-04-09 LAB — COMPREHENSIVE METABOLIC PANEL
ALT: 7 U/L (ref 0–44)
AST: 21 U/L (ref 15–41)
Albumin: 3.3 g/dL — ABNORMAL LOW (ref 3.5–5.0)
Alkaline Phosphatase: 95 U/L (ref 38–126)
Anion gap: 10 (ref 5–15)
BUN: 12 mg/dL (ref 8–23)
CO2: 27 mmol/L (ref 22–32)
Calcium: 9.2 mg/dL (ref 8.9–10.3)
Chloride: 104 mmol/L (ref 98–111)
Creatinine, Ser: 0.74 mg/dL (ref 0.44–1.00)
GFR, Estimated: >60 mL/min (ref >60)
Glucose, Bld: 95 mg/dL (ref 70–99)
Potassium: 3.6 mmol/L (ref 3.5–5.1)
Sodium: 141 mmol/L (ref 135–145)
Total Bilirubin: 0.4 mg/dL (ref 0.3–1.2)
Total Protein: 7.1 g/dL (ref 6.5–8.1)

## 2021-04-09 LAB — CEA (IN HOUSE-CHCC): CEA (CHCC-In House): <1 ng/mL (ref 0.00–5.00)

## 2021-04-09 NOTE — Patient Instructions (Signed)
It was a pleasure meeting you today. We will get you set up for the procedure we discussed to draw some fluid off (paracentesis). You will meet with our medical oncologist, Dr. Alvy Bimler, later this week to discuss chemotherapy. We will also get a CT of your chest.  Once we get the results back from your paracentesis, I will call you to let you know. The tentative plan will be that we proceed with 3-4 cycles of chemotherapy, repeat imaging, and then decide if it is time for surgery to remove remaining cancer.

## 2021-04-09 NOTE — Progress Notes (Signed)
Met with Karen Winters after her appointment with Dr. Berline Lopes.  Went over plan for paracentesis followed by chemotherapy/consideration for surgery.  Provided her with the [employer] and information about the [employer] and encouraged her to call with any questions.

## 2021-04-09 NOTE — Progress Notes (Signed)
GYNECOLOGIC ONCOLOGY NEW PATIENT CONSULTATION   Patient Name: Karen Winters  Patient Age: 70 y.o. 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  I reviewed in detail with the patient and her husband her ultrasound and CT findings.  We looked at the CT images together.  Given imaging findings, her abdominal symptoms, and elevated Ca1 25, I discussed that this is highly concerning for gynecologic malignancy, likely ovarian cancer.  I discussed that the treatment approach for this disease is typically combination of cytoreductive surgery and chemotherapy. I discussed that sequencing of this can be either with upfront debulking followed by adjuvant chemotherapy sequentially or neoadjuvant chemotherapy followed by an interval cytoreductive attempt, then additional chemotherapy. This latter approach is associated with a reduced perioperative morbidity at the time of surgery.  I discussed that decisions regarding sequencing of therapy is individualized taking into account individual patient health, in addition to the apparent tumor distribution on imaging, and likelihood of complete surgical resection at the time of surgery. I discussed that the overall survival observed in patients is equivalent for both approaches (neoadjuvant chemotherapy versus primary debulking surgery) provided that there is an optimal cytoreductive effort at the time of surgery (regardless of the timing of that surgery).  Given findings with regard to her small bowel, which are concerning for miliary disease and/or mesenteric disease, I favor neoadjuvant approach.  I did offer that we could move forward instead with a diagnostic laparoscopy with exploratory debulking surgery if it  appeared feasible to complete an optimal resection.  After our discussion, the patient favored moving forward with neoadjuvant chemotherapy.  We had a discussion about HIPEC.  I would say that there is not robust data in the upfront setting but we do have some data interval debulking setting for the use of HIPEC.  We do not have the capability at this time to perform interval debulking with HIPEC at [facility] or [facility].  If the patient was interested in further discussion about hyperthermic intraperitoneal chemotherapy, I would be happy to refer her to a center that is currently performing this.  Today she voices not wanting a second consult, but I have asked her to call to let me know if anything changes.  We discussed that the first step will be to establish a diagnosis.  This would most easily be done by paracentesis.  I placed an order for the patient to undergo paracentesis.  If for some reason, this does not establish diagnosis, then plan would be for diagnostic laparoscopy for tissue biopsy.  The patient will also see our medical oncologist, Dr. Alvy Bimler, later this week.  We discussed intended agents for now neoadjuvant chemotherapy use.  We also discussed that routinely we would give 3 cycles while monitoring CA-125 and then repeat a CT scan to assess for treatment response.  Most typically, if there has been adequate treatment response, then we will proceed with interval debulking surgery after 3-4 cycles of chemotherapy.  I recommend that the patient have genetic testing.  I will wait to put referral to our genetic counselors until we have her confirmed diagnosis.  We discussed the role of germline testing and that even if this is negative, that I would recommend somatic testing after her surgery.  We discussed that germline testing can  have ramifications both for her own cancer risk but also her offspring and relatives.  Somatic and germline testing could have implications for future targeted  treatment options.  A copy of this note was sent to the patient's referring provider.   65 minutes of total time was spent for this patient encounter, including preparation, face-to-face counseling with the patient and coordination of care, and documentation of the encounter.  Jeral Pinch, MD  Division of Gynecologic Oncology  Department of Obstetrics and Gynecology  [facility]  ___________________________________________  Chief Complaint: Chief Complaint  Patient presents with  . Adnexal mass    Bilateral     History of Present Illness:  Karen Winters is a 70 y.o. y.o. female who is seen in consultation at the request of Dr. Sabra Heck for an evaluation of bilateral adnexal masses, elevated CA-125.  Patient presents with her husband today.  She notes that about 2 weeks ago, she noticed an umbilical hernia.  This occurred because she was doing an exercise regimen that included a daily plank.  She was scheduled to see a surgeon today to talk about possible repair.  She also endorses intermittent pain in around the area of her right hip that she thought was secondary to her exercise routine.  Over the last week, she has had a decreased appetite.  She denies any nausea or emesis but endorses several months of abdominal bloating and early satiety.  She notes normal bowel function, which she describes as bowel movements at least daily.  She has had some increased urinary frequency and around [place] developed stress urinary incontinence when she runs.  The patient lives in [place] with her husband.  She is not currently working.  Her surgical history is notable for 2 pregnancies delivered by C-section.  She had endometriosis and infertility and underwent 2 laparoscopic surgeries for work-up of infertility.  PAST MEDICAL HISTORY:  Past Medical History:  Diagnosis Date  . Endometriosis   . History of anemia   . Migraines   . Osteoporosis      PAST  SURGICAL HISTORY:  Past Surgical History:  Procedure Laterality Date  . [place]   and BTSP  . COLONOSCOPY    . LAPAROSCOPY     for infertility-endometriosis  . LAPAROSCOPY     2nd for infertility  . mole removed     precancerous mole right upper abd  . ORIF WRIST FRACTURE Right 08/02/2020   Procedure: Right distal radius open reduction internal fixation;  Surgeon: Verner Mould, MD;  Location: [place];  Service: Orthopedics;  Laterality: Right;  44min  . TONSILLECTOMY AND ADENOIDECTOMY      OB/GYN HISTORY:  OB History  Gravida Para Term Preterm AB Living  2 2       2   SAB IAB Ectopic Multiple Live Births               # Outcome Date GA Lbr Len/2nd Weight Sex Delivery Anes PTL Lv  2 Para 1989    F      1 Para 1986    F        Patient's last menstrual period was 10/31/2008.  Age at menarche: 58 Age at menopause: 57 Hx of HRT: Denies Hx of STDs: Denies Last pap: 04/03/21, [facility] History of abnormal pap smears: Denies  SCREENING STUDIES:  Last mammogram: 08/2020  Last colonoscopy: 01/2015 Last bone mineral density: 08/2020  MEDICATIONS: Outpatient Encounter Medications as of  04/09/2021  Medication Sig  . acetaminophen (TYLENOL) 325 MG tablet Take 650 mg by mouth every 6 (six) hours as needed for mild pain.   . Calcium-Magnesium-Vitamin D (CALCIUM 500 PO) Take 500 mg by mouth daily.   Marland Kitchen EPINEPHrine 0.3 mg/0.3 mL IJ SOAJ injection Inject 0.3 mg as directed as needed (allergic reaction).   Marland Kitchen guaiFENesin (MUCINEX) 600 MG 12 hr tablet Take 600 mg by mouth 2 (two) times daily.  Marland Kitchen ibuprofen (ADVIL) 200 MG tablet Take 400 mg by mouth every 6 (six) hours as needed for mild pain or moderate pain.  . Misc Natural Products (OSTEO BI-FLEX JOINT SHIELD PO) Take 1 tablet by mouth daily.   . Multiple Vitamins-Minerals (MULTIVITAMIN PO) Take 1 tablet by mouth daily.   . SUMAtriptan (IMITREX) 100 MG tablet 1 tab with headache onset.  Can repeat in 2 hours.  Max  dosage 200mg /24 hours. (Patient not taking: Reported on 04/09/2021)   No facility-administered encounter medications on file as of 04/09/2021.    ALLERGIES:  Allergies  Allergen Reactions  . Bee Venom Swelling  . Latex Other (See Comments)    Redness, itching  . Mushroom Extract Complex Nausea And Vomiting    Diarrhea  . Penicillins Other (See Comments)    Hives Reaction:  70 years old  . Tape Itching    Redness     FAMILY HISTORY:  Family History  Problem Relation Age of Onset  . Diabetes Maternal Grandmother   . Breast cancer Maternal Grandmother   . Renal cancer Father   . Hypertension Sister   . Hypertension Brother   . Heart attack Paternal Grandfather   . Hypothyroidism Sister   . Other Sister        Wegener's syndrome     SOCIAL HISTORY:  Social Connections: Not on file    REVIEW OF SYSTEMS:  Pertinent positives as per HPI including pelvic pain and decreased appetite Denies appetite changes, fevers, chills, fatigue, unexplained weight changes. Denies hearing loss, neck lumps or masses, mouth sores, ringing in ears or voice changes. Denies cough or wheezing.  Denies shortness of breath. Denies chest pain or palpitations. Denies leg swelling. Denies blood in stools, constipation, diarrhea, nausea, vomiting. Denies pain with intercourse, dysuria, frequency, hematuria or incontinence. Denies hot flashes, vaginal bleeding or vaginal discharge.   Denies joint pain, back pain or muscle pain/cramps. Denies itching, rash, or wounds. Denies dizziness, headaches, numbness or seizures. Denies swollen lymph nodes or glands, denies easy bruising or bleeding. Denies anxiety, depression, confusion, or decreased concentration.  Physical Exam:  Vital Signs for this encounter:  Blood pressure (!) 152/85, pulse 66, temperature 97.6 F (36.4 C), temperature source Oral, resp. rate 18, weight 132 lb (59.9 kg), last menstrual period 10/31/2008, SpO2 100 %. Body mass index is  22.48 kg/m. General: Alert, oriented, no acute distress.  HEENT: Normocephalic, atraumatic. Sclera anicteric.  Chest: Clear to auscultation bilaterally. No wheezes, rhonchi, or rales. Cardiovascular: Regular rate and rhythm, no murmurs, rubs, or gallops.  Abdomen: Normoactive bowel sounds.  Small umbilical hernia noted.  Soft, mildly distended, mildly tympanitic, nontender to palpation. No masses or hepatosplenomegaly appreciated. + palpable fluid wave.  Extremities: Grossly normal range of motion. Warm, well perfused. No edema bilaterally.  Skin: No rashes or lesions.  Lymphatics: No cervical, supraclavicular, or inguinal adenopathy.  GU:  Normal external female genitalia. No lesions. No discharge or bleeding.             Bladder/urethra:  No lesions or  masses, well supported bladder             Vagina: Mildly atrophic, no masses or lesions.  No bleeding or discharge.             Cervix: Normal appearing, no lesions.             Uterus/adnexa: Uterus and bilateral adnexal masses move as a large conglomeration and Unasyn.  Can appreciate ascites versus masses within the cul-de-sac.  Nodularity appreciated within the posterior cul-de-sac.  Rectal: Confirms findings from bimanual exam.  LABORATORY AND RADIOLOGIC DATA:  Outside medical records were reviewed to synthesize the above history, along with the history and physical obtained during the visit.   Lab Results  Component Value Date   WBC 7.1 08/01/2020   HGB 14.7 08/01/2020   HCT 46.1 (H) 08/01/2020   PLT 233 08/01/2020   GLUCOSE 95 04/09/2021   ALT 7 04/09/2021   AST 21 04/09/2021   NA 141 04/09/2021   K 3.6 04/09/2021   CL 104 04/09/2021   CREATININE 0.74 04/09/2021   BUN 12 04/09/2021   CO2 27 04/09/2021   TSH 0.985 01/13/2014     Ref Range & Units 4 d ago  Cancer Antigen (CA) 125 0.0 - 38.1 U/mL 826.0High       Pelvic ultrasound 04/04/21: IMPRESSION: Septate uterus, otherwise unremarkable.  Normal endometrial  complex.  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  Recommend further assessment by CT imaging with IV and oral Contrast.  CT A/P on 04/05/21: IMPRESSION: 1. Large bilateral solid-appearing adnexal masses are identified concerning for ovarian neoplasm. There is soft tissue infiltration into the surrounding peritoneal cavity which partially encases nonobstructed loops of small bowel with probable serosal involvement. 2. Moderate volume of ascites identified within the abdomen and pelvis likely secondary to peritoneal carcinomatosis. Diagnostic paracentesis may be helpful for further workup. 3. Mild increased caliber of small bowel loops with a few air-fluid levels. However, there are no signs to suggest a bowel obstruction as enteric contrast material is noted up to the level of the hepatic flexure.

## 2021-04-10 ENCOUNTER — Ambulatory Visit (HOSPITAL_COMMUNITY)
Admission: RE | Admit: 2021-04-10 | Discharge: 2021-04-10 | Disposition: A | Discharge status: Home / Self Care | Payer: Medicare HMO | Source: Ambulatory Visit | Attending: Gynecologic Oncology | Admitting: Gynecologic Oncology

## 2021-04-10 ENCOUNTER — Encounter: Payer: Self-pay | Admitting: Hematology and Oncology

## 2021-04-10 DIAGNOSIS — R180 Malignant ascites: Secondary | ICD-10-CM | POA: Insufficient documentation

## 2021-04-10 DIAGNOSIS — C569 Malignant neoplasm of unspecified ovary: Secondary | ICD-10-CM | POA: Insufficient documentation

## 2021-04-10 DIAGNOSIS — N9489 Other specified conditions associated with female genital organs and menstrual cycle: Secondary | ICD-10-CM

## 2021-04-10 MED ORDER — LIDOCAINE HCL 1 % IJ SOLN
INTRAMUSCULAR | Status: AC
  Filled 2021-04-10: qty 20

## 2021-04-10 NOTE — Procedures (Signed)
PROCEDURE SUMMARY:  Successful image-guided paracentesis from the right lower abdomen.  Yielded 1.4  liters of clear yellow fluid.  No immediate complications.  EBL = trace. Patient tolerated well.   Specimen was sent for labs.  Please see imaging section of Epic for full dictation.   Armando Gang Lavern Crimi PA-C 04/10/2021 1:14 PM

## 2021-04-11 ENCOUNTER — Encounter: Payer: Self-pay | Admitting: Hematology and Oncology

## 2021-04-11 ENCOUNTER — Other Ambulatory Visit: Payer: Self-pay

## 2021-04-11 ENCOUNTER — Inpatient Hospital Stay: Payer: Medicare HMO | Admitting: Hematology and Oncology

## 2021-04-11 VITALS — BP 126/77 | HR 66 | Temp 97.4°F | Resp 18 | Ht 64.25 in | Wt 123.9 lb

## 2021-04-11 DIAGNOSIS — R630 Anorexia: Secondary | ICD-10-CM | POA: Insufficient documentation

## 2021-04-11 DIAGNOSIS — R194 Change in bowel habit: Secondary | ICD-10-CM | POA: Diagnosis not present

## 2021-04-11 DIAGNOSIS — C563 Malignant neoplasm of bilateral ovaries: Secondary | ICD-10-CM | POA: Diagnosis not present

## 2021-04-11 DIAGNOSIS — R180 Malignant ascites: Secondary | ICD-10-CM | POA: Diagnosis not present

## 2021-04-11 DIAGNOSIS — Z5111 Encounter for antineoplastic chemotherapy: Secondary | ICD-10-CM | POA: Diagnosis not present

## 2021-04-11 HISTORY — DX: Change in bowel habit: R19.4

## 2021-04-11 MED ORDER — ONDANSETRON HCL 8 MG PO TABS: 8.0000 mg | ORAL_TABLET | Freq: Two times a day (BID) | ORAL | 1 refills | Status: DC | PRN

## 2021-04-11 MED ORDER — PROCHLORPERAZINE MALEATE 10 MG PO TABS: 10.0000 mg | ORAL_TABLET | Freq: Four times a day (QID) | ORAL | 1 refills | Status: DC | PRN

## 2021-04-11 MED ORDER — DEXAMETHASONE 4 MG PO TABS: ORAL_TABLET | ORAL | 6 refills | Status: DC

## 2021-04-11 NOTE — Assessment & Plan Note (Addendum)
We discussed the risk and benefits of neoadjuvant chemotherapy We reviewed the NCCN guidelines We discussed the role of chemotherapy. The intent is of curative intent.  We discussed some of the risks, benefits, side-effects of carboplatin & Taxol. Treatment is intravenous, every 3 weeks x 6 cycles I plan to follow tumor marker and repeat imaging study after cycle 3 and if she have excellent response to treatment, I will refer her back to Dr. Berline Lopes for interval debulking surgery Even after excellent response to treatment and interval debulking surgery, she is aware that she will return back to me after surgery to complete 3 more cycles of treatment  Some of the short term side-effects included, though not limited to, including weight loss, life threatening infections, risk of allergic reactions, need for transfusions of blood products, nausea, vomiting, change in bowel habits, loss of hair, admission to hospital for various reasons, and risks of death.   Long term side-effects are also discussed including risks of infertility, permanent damage to nerve function, hearing loss, chronic fatigue, kidney damage with possibility needing hemodialysis, and rare secondary malignancy including bone marrow disorders.  The patient is aware that the response rates discussed earlier is not guaranteed.  After a long discussion, patient made an informed decision to proceed with the prescribed plan of care.   Patient education material was dispensed. We discussed premedication with dexamethasone before chemotherapy. We discussed chemo education class She has good venous access and would not need port placement We discussed wig and she is not interested She would benefit from genetic counseling after interval of the bulking surgery

## 2021-04-11 NOTE — Assessment & Plan Note (Signed)
We discussed risks of constipation

## 2021-04-11 NOTE — Progress Notes (Signed)
START ON PATHWAY REGIMEN - Ovarian     A cycle is every 21 days:     Paclitaxel      Carboplatin   **Always confirm dose/schedule in your pharmacy ordering system**  Patient Characteristics: Preoperative or Nonsurgical Candidate (Clinical Staging), Newly Diagnosed, Neoadjuvant Therapy followed by Surgery BRCA Mutation Status: Awaiting Test Results Therapeutic Status: Preoperative or Nonsurgical Candidate (Clinical Staging) AJCC T Category: cT3c AJCC 8 Stage Grouping: IIIC AJCC N Category: cN0 AJCC M Category: cM0 Therapy Plan: Neoadjuvant Therapy followed by Surgery Intent of Therapy: Curative Intent, Discussed with Patient

## 2021-04-11 NOTE — Assessment & Plan Note (Signed)
She had recent therapeutic paracentesis We will monitor closely I am hopeful, if she have good response to treatment, she would not need further therapeutic paracentesis in the future

## 2021-04-11 NOTE — Assessment & Plan Note (Signed)
I suggest dietary modification and high protein intake

## 2021-04-11 NOTE — Progress Notes (Signed)
[facility] CONSULT NOTE  Patient Care Team: Karen Winters, Karen L, NP as PCP - General (Nurse Practitioner)  ASSESSMENT & PLAN:  Ovarian cancer [facility](HCC) We discussed the risk and benefits of neoadjuvant chemotherapy We reviewed the NCCN guidelines We discussed the role of chemotherapy. The intent is of curative intent.  We discussed some of the risks, benefits, side-effects of carboplatin & Taxol. Treatment is intravenous, every 3 weeks x 6 cycles I plan to follow tumor marker and repeat imaging study after cycle 3 and if she have excellent response to treatment, I will refer her back to Karen Winters for interval debulking surgery Even after excellent response to treatment and interval debulking surgery, she is aware that she will return back to me after surgery to complete 3 more cycles of treatment  Some of the short term side-effects included, though not limited to, including weight loss, life threatening infections, risk of allergic reactions, need for transfusions of blood products, nausea, vomiting, change in bowel habits, loss of hair, admission to hospital for various reasons, and risks of death.   Long term side-effects are also discussed including risks of infertility, permanent damage to nerve function, hearing loss, chronic fatigue, kidney damage with possibility needing hemodialysis, and rare secondary malignancy including bone marrow disorders.  The patient is aware that the response rates discussed earlier is not guaranteed.  After a long discussion, patient made an informed decision to proceed with the prescribed plan of care.   Patient education material was dispensed. We discussed premedication with dexamethasone before chemotherapy. We discussed chemo education class She has good venous access and would not need port placement We discussed wig and she is not interested She would benefit from genetic counseling after interval of the bulking surgery   Malignant  ascites She had recent therapeutic paracentesis We will monitor closely I am hopeful, if she have good response to treatment, she would not need further therapeutic paracentesis in the future  Change in bowel habits We discussed risks of constipation  Anorexia I suggest dietary modification and high protein intake   Orders Placed This Encounter  Procedures  . CBC with Differential (Cancer Center Only)    Standing Status:   Standing    Number of Occurrences:   20    Standing Expiration Date:   04/11/2022  . CMP (Cancer Center only)    Standing Status:   Standing    Number of Occurrences:   20    Standing Expiration Date:   04/11/2022  . CA 125    Standing Status:   Standing    Number of Occurrences:   11    Standing Expiration Date:   04/11/2022    The total time spent in the appointment was 60 minutes encounter with patients including review of chart and various tests results, discussions about plan of care and coordination of care plan   All questions were answered. The patient knows to call the clinic with any problems, questions or concerns. No barriers to learning was detected.  Karen DelayNi Jarae Panas, MD 5/12/20222:43 PM  CHIEF COMPLAINTS/PURPOSE OF CONSULTATION:  Ovarian cancer  HISTORY OF PRESENTING ILLNESS:  Karen Winters 70 y.o. female is here because of recent diagnosis of ovarian cancer She is here with her husband Karen Winters She has recent changes in bowel habits and urinary frequency and reduced appetite She had repeat paracentesis yesterday She denies worsening sensation of bloating  I have reviewed her chart and materials related to her cancer extensively and collaborated  history with the patient. Summary of oncologic history is as follows: Oncology History  Ovarian cancer ([place])  04/01/2021 Initial Diagnosis   She notes that she noticed an umbilical hernia.  She was scheduled to see a surgeon about possible repair.  She also endorses intermittent pain in around the area of  her right hip that she thought was secondary to her exercise routine.  Over the last week, she has had a decreased appetite.  She denies any nausea or emesis but endorses several months of abdominal bloating and early satiety.  She notes normal bowel function, which she describes as bowel movements at least daily.  She has had some increased urinary frequency and around [place] developed stress urinary incontinence when she runs.     04/04/2021 Imaging   US pelvis  Septate uterus, otherwise unremarkable.   Normal endometrial complex.   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   Recommend further assessment by CT imaging with IV and oral contrast.   04/05/2021 Tumor Marker   Patient's tumor was tested for the following markers: CA-125 Results of the tumor marker test revealed 826.   04/05/2021 Imaging   1. Large bilateral solid-appearing adnexal masses are identified concerning for ovarian neoplasm. There is soft tissue infiltration into the surrounding peritoneal cavity which partially encases nonobstructed loops of small bowel with probable serosal involvement. 2. Moderate volume of ascites identified within the abdomen and pelvis likely secondary to peritoneal carcinomatosis. Diagnostic paracentesis may be helpful for further workup. 3. Mild increased caliber of small bowel loops with a few air-fluid levels. However, there are no signs to suggest a bowel obstruction as enteric contrast material is noted up to the level of the hepatic flexure.   04/10/2021 Initial Diagnosis   Ovarian cancer ([place])   04/10/2021 Procedure   Successful ultrasound-guided paracentesis yielding 1.4 liters of peritoneal fluid.   04/11/2021 Cancer Staging   Staging form: Ovary, Fallopian Tube, and Primary Peritoneal Carcinoma, AJCC 8th Edition - Clinical stage from 04/11/2021: Stage IIIC (cT3c, cN0, cM0) - Signed by  Karen Lark, MD on 04/11/2021 Stage prefix: Initial diagnosis   04/18/2021 -  Chemotherapy    Patient is on Treatment Plan: OVARIAN CARBOPLATIN (AUC 6) / PACLITAXEL (175) Q21D X 6 CYCLES        MEDICAL HISTORY:  Past Medical History:  Diagnosis Date  . Endometriosis   . History of anemia   . Migraines   . Osteoporosis     SURGICAL HISTORY: Past Surgical History:  Procedure Laterality Date  . [place]   and BTSP  . COLONOSCOPY    . LAPAROSCOPY     for infertility-endometriosis  . LAPAROSCOPY     2nd for infertility  . mole removed     precancerous mole right upper abd  . ORIF WRIST FRACTURE Right 08/02/2020   Procedure: Right distal radius open reduction internal fixation;  Surgeon: Verner Mould, MD;  Location: [place];  Service: Orthopedics;  Laterality: Right;  26min  . TONSILLECTOMY AND ADENOIDECTOMY      SOCIAL HISTORY: Social History   Socioeconomic History  . Marital status: Married    Spouse name: Abbe Amsterdam  . Number of children: 2  . Years of education: Not on file  . Highest education level: Not on file  Occupational History  . Occupation: retired  Tobacco Use  . Smoking status: Former Audiological scientist  date: 12/02/1983    Years since quitting: 37.3  . Smokeless tobacco: Never Used  . Tobacco comment: quit 1985  Vaping Use  . Vaping Use: Never used  Substance and Sexual Activity  . Alcohol use: Yes    Alcohol/week: 0.0 standard drinks    Comment: ocassional  . Drug use: No  . Sexual activity: Yes    Partners: Male    Birth control/protection: Surgical, Post-menopausal    Comment: BTL  Other Topics Concern  . Not on file  Social History Narrative  . Not on file   Social Determinants of Health   Financial Resource Strain: Not on file  Food Insecurity: Not on file  Transportation Needs: Not on file  Physical Activity: Not on file  Stress: Not on file  Social Connections: Not on file  Intimate Partner Violence: Not on file     FAMILY HISTORY: Family History  Problem Relation Age of Onset  . Diabetes Maternal Grandmother   . Breast cancer Maternal Grandmother   . Renal cancer Father   . Hypertension Sister   . Hypertension Brother   . Heart attack Paternal Grandfather   . Hypothyroidism Sister   . Other Sister        Wegener's syndrome    ALLERGIES:  is allergic to bee venom, latex, mushroom extract complex, penicillins, and tape.  MEDICATIONS:  Current Outpatient Medications  Medication Sig Dispense Refill  . dexamethasone (DECADRON) 4 MG tablet Take 2 tabs at the night before and 2 tab the morning of chemotherapy, every 3 weeks, by mouth x 6 cycles 36 tablet 6  . acetaminophen (TYLENOL) 325 MG tablet Take 650 mg by mouth every 6 (six) hours as needed for mild pain.     . Calcium-Magnesium-Vitamin D (CALCIUM 500 PO) Take 500 mg by mouth daily.     Marland Kitchen EPINEPHrine 0.3 mg/0.3 mL IJ SOAJ injection Inject 0.3 mg as directed as needed (allergic reaction).     Marland Kitchen guaiFENesin (MUCINEX) 600 MG 12 hr tablet Take 600 mg by mouth 2 (two) times daily.    Marland Kitchen ibuprofen (ADVIL) 200 MG tablet Take 400 mg by mouth every 6 (six) hours as needed for mild pain or moderate pain.    . Misc Natural Products (OSTEO BI-FLEX JOINT SHIELD PO) Take 1 tablet by mouth daily.     . Multiple Vitamins-Minerals (MULTIVITAMIN PO) Take 1 tablet by mouth daily.     . ondansetron (ZOFRAN) 8 MG tablet Take 1 tablet (8 mg total) by mouth 2 (two) times daily as needed for refractory nausea / vomiting. Start on day 3 after carboplatin chemo. 30 tablet 1  . prochlorperazine (COMPAZINE) 10 MG tablet Take 1 tablet (10 mg total) by mouth every 6 (six) hours as needed (Nausea or vomiting). 30 tablet 1  . SUMAtriptan (IMITREX) 100 MG tablet 1 tab with headache onset.  Can repeat in 2 hours.  Max dosage 200mg /24 hours. (Patient not taking: Reported on 04/09/2021) 27 tablet 4   No current facility-administered medications for this visit.    REVIEW OF  SYSTEMS:   Constitutional: Denies fevers, chills or abnormal night sweats Eyes: Denies blurriness of vision, double vision or watery eyes Ears, nose, mouth, throat, and face: Denies mucositis or sore throat Respiratory: Denies cough, dyspnea or wheezes Cardiovascular: Denies palpitation, chest discomfort or lower extremity swelling Skin: Denies abnormal skin rashes Lymphatics: Denies new lymphadenopathy or easy bruising Neurological:Denies numbness, tingling or new weaknesses Behavioral/Psych: Mood is stable, no new changes  All  other systems were reviewed with the patient and are negative.  PHYSICAL EXAMINATION: ECOG PERFORMANCE STATUS: 1 - Symptomatic but completely ambulatory  Vitals:   04/11/21 1148  BP: 126/77  Pulse: 66  Resp: 18  Temp: (!) 97.4 F (36.3 C)  SpO2: 100%   Filed Weights   04/11/21 1148  Weight: 123 lb 14.4 oz (56.2 kg)    GENERAL:alert, no distress and comfortable SKIN: skin color, texture, turgor are normal, no rashes or significant lesions EYES: normal, conjunctiva are pink and non-injected, sclera clear OROPHARYNX:no exudate, no erythema and lips, buccal mucosa, and tongue normal  NECK: supple, thyroid normal size, non-tender, without nodularity LYMPH:  no palpable lymphadenopathy in the cervical, axillary or inguinal LUNGS: clear to auscultation and percussion with normal breathing effort HEART: regular rate & rhythm and no murmurs and no lower extremity edema ABDOMEN:abdomen soft, distended with mild ascites, non-tender and normal bowel sounds Musculoskeletal:no cyanosis of digits and no clubbing  PSYCH: alert & oriented x 3 with fluent speech NEURO: no focal motor/sensory deficits  LABORATORY DATA:  I have reviewed the data as listed Lab Results  Component Value Date   WBC 7.1 08/01/2020   HGB 14.7 08/01/2020   HCT 46.1 (H) 08/01/2020   MCV 91.5 08/01/2020   PLT 233 08/01/2020   Recent Labs    04/09/21 1044  NA 141  K 3.6  CL 104   CO2 27  GLUCOSE 95  BUN 12  CREATININE 0.74  CALCIUM 9.2  GFRNONAA >60  PROT 7.1  ALBUMIN 3.3*  AST 21  ALT 7  ALKPHOS 95  BILITOT 0.4    RADIOGRAPHIC STUDIES: I have personally reviewed the radiological images as listed and agreed with the findings in the report. CT ABDOMEN PELVIS W WO CONTRAST  Result Date: 04/07/2021 CLINICAL DATA:  Evaluate abdominal swelling.  Ascites. EXAM: CT ABDOMEN AND PELVIS WITHOUT AND WITH CONTRAST TECHNIQUE: Multidetector CT imaging of the abdomen and pelvis was performed following the standard protocol before and following the bolus administration of intravenous contrast. CONTRAST:  35mL OMNIPAQUE IOHEXOL 300 MG/ML  SOLN COMPARISON:  None. FINDINGS: Lower chest: No acute abnormality. Hepatobiliary: Tiny low-density structure within dome of liver measuring 3 mm, image 13/7. Too small to reliably characterize. No suspicious enhancing liver lesions. Gallbladder negative. No biliary dilatation. Pancreas: Unremarkable. No pancreatic ductal dilatation or surrounding inflammatory changes. Spleen: Normal in size without focal abnormality. Adrenals/Urinary Tract: Normal adrenal glands. No kidney mass or hydronephrosis. Bladder unremarkable. Stomach/Bowel: Stomach is nondistended. The appendix is visualized and is normal. No bowel wall thickening or inflammation identified. Mild increased caliber of small bowel loops are identified within the central abdomen with a few air-fluid levels. However, there are no signs to suggest a bowel obstruction as enteric contrast material is noted up to the level of the hepatic flexure. Vascular/Lymphatic: No significant vascular findings are present. No enlarged abdominal or pelvic lymph nodes. Reproductive: Uterus is unremarkable. Large bilateral solid-appearing, ill-defined adnexal masses are identified. On the left this measures 7.2 x 5.8 cm. On the right this measures 6.7 x 5.3 cm. There is soft tissue infiltration into the surrounding  peritoneal cavity which partially encases nonobstructed loops of small bowel with probable serosal involvement. Other: Moderate volume of ascites identified within the abdomen and pelvis. Musculoskeletal: No acute or significant osseous findings. IMPRESSION: 1. Large bilateral solid-appearing adnexal masses are identified concerning for ovarian neoplasm. There is soft tissue infiltration into the surrounding peritoneal cavity which partially encases nonobstructed loops of small  bowel with probable serosal involvement. 2. Moderate volume of ascites identified within the abdomen and pelvis likely secondary to peritoneal carcinomatosis. Diagnostic paracentesis may be helpful for further workup. 3. Mild increased caliber of small bowel loops with a few air-fluid levels. However, there are no signs to suggest a bowel obstruction as enteric contrast material is noted up to the level of the hepatic flexure. Electronically Signed   By: Kerby Moors M.D.   On: 04/07/2021 17:16   US Paracentesis  Result Date: 04/10/2021 INDICATION: Abdominal distension, ascites seen on recent CT scan. Request for therapeutic and diagnostic paracentesis. EXAM: ULTRASOUND GUIDED  PARACENTESIS MEDICATIONS: 10 mL 1% lidocaine COMPLICATIONS: None immediate. PROCEDURE: Informed written consent was obtained from the patient after a discussion of the risks, benefits and alternatives to treatment. A timeout was performed prior to the initiation of the procedure. Initial ultrasound scanning demonstrates a large amount of ascites within the right lower abdominal quadrant. The right lower abdomen was prepped and draped in the usual sterile fashion. 1% lidocaine was used for local anesthesia. Following this, a 19 gauge, 7-cm, Yueh catheter was introduced. An ultrasound image was saved for documentation purposes. The paracentesis was performed. The catheter was removed and a dressing was applied. The patient tolerated the procedure well without  immediate post procedural complication. FINDINGS: A total of approximately 1.4 Winters of clear yellow fluid was removed. Samples were sent to the laboratory as requested by the clinical team. IMPRESSION: Successful ultrasound-guided paracentesis yielding 1.4 liters of peritoneal fluid. Read by: Durenda Guthrie, PA-C Electronically Signed   By: Jacqulynn Cadet M.D.   On: 04/10/2021 13:14   US PELVIC COMPLETE WITH TRANSVAGINAL  Result Date: 04/04/2021 CLINICAL DATA:  Enlarged uterus on physical exam, postmenopausal EXAM: TRANSABDOMINAL AND TRANSVAGINAL ULTRASOUND OF PELVIS TECHNIQUE: Both transabdominal and transvaginal ultrasound examinations of the pelvis were performed. Transabdominal technique was performed for global imaging of the pelvis including uterus, ovaries, adnexal regions, and pelvic cul-de-sac. It was necessary to proceed with endovaginal exam following the transabdominal exam to visualize the uterus, endometrium, and ovaries. COMPARISON:  None FINDINGS: Uterus Measurements: 5.3 x 1.6 x 3.3 cm = volume: 14.6 mL. Atrophic. Anteverted. Normal morphology without mass Endometrium Thickness: 4 mm.  Septate morphology.  No mass or fluid. Right ovary No normal appearing RIGHT ovary visualized Left ovary No normal appearing RIGHT ovary visualized Other findings Moderate ascites. Multiple abnormal complex cystic lesions with septations and mural nodules. Internal debris within numerous cystic foci. Abnormal soft tissue in pelvis. Findings are most consistent with ovarian cancer likely bilaterally with pelvic carcinomatosis and ascites. IMPRESSION: Septate uterus, otherwise unremarkable. Normal endometrial complex. 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 Recommend further assessment by CT imaging with IV and oral contrast. These results will be called to the ordering clinician or representative by the Radiologist  Assistant, and communication documented in the PACS or [employer]. Electronically Signed   By: Lavonia Dana M.D.   On: 04/04/2021 16:06

## 2021-04-15 ENCOUNTER — Telehealth: Payer: Self-pay | Admitting: Oncology

## 2021-04-15 LAB — CYTOLOGY - NON PAP

## 2021-04-15 NOTE — Telephone Encounter (Signed)
Karen Winters left a message regarding her first chemotherapy appointment on 05/14/2021.  She is wondering if there is anything we can do to move the appointment up.  She is willing to go to [facility] or [facility].

## 2021-04-16 ENCOUNTER — Telehealth: Payer: Self-pay | Admitting: Emergency Medicine

## 2021-04-16 ENCOUNTER — Ambulatory Visit (HOSPITAL_COMMUNITY): Payer: Medicare HMO

## 2021-04-16 ENCOUNTER — Telehealth: Payer: Self-pay

## 2021-04-16 NOTE — Progress Notes (Signed)
Per MD use labs from 5/19 for treatment on 5/23.  No labs needed on 5/23.   Patient notified of appointment time/date/location for first treatment.

## 2021-04-16 NOTE — Telephone Encounter (Signed)
[facility] PO [address]54 - Customer service manager for the Discovery and Validation of Biomarkers for the Prediction, Diagnosis, and Management of Disease  Confirmed I was speaking to the patient.  Introduced this research study to the patient.  She stated that she is interested in participating.  Informed consent form and HIPAA authorization were securely emailed to the patient at her request.    Scheduled appointment to consent to this research study Thursday 04/18/21 at 7:15am prior to her already scheduled lab appointment.  Will have research labs drawn immediately after if patient consents to the study.  Patient was thanked for her time.  Clabe Seal Clinical Research Coordinator I  04/16/21  4:46 PM

## 2021-04-17 ENCOUNTER — Other Ambulatory Visit: Payer: Self-pay | Admitting: *Deleted

## 2021-04-17 ENCOUNTER — Telehealth: Payer: Self-pay | Admitting: Oncology

## 2021-04-17 ENCOUNTER — Other Ambulatory Visit: Payer: Self-pay | Admitting: Gynecologic Oncology

## 2021-04-17 DIAGNOSIS — N9489 Other specified conditions associated with female genital organs and menstrual cycle: Secondary | ICD-10-CM

## 2021-04-17 DIAGNOSIS — C569 Malignant neoplasm of unspecified ovary: Secondary | ICD-10-CM

## 2021-04-17 DIAGNOSIS — Z006 Encounter for examination for normal comparison and control in clinical research program: Secondary | ICD-10-CM

## 2021-04-17 NOTE — Telephone Encounter (Signed)
Called Karen Winters and scheduled genetic counseling for 05/09/21 at 10:00.  She verbalized understanding and agreement of appointment.

## 2021-04-18 ENCOUNTER — Other Ambulatory Visit: Payer: Self-pay | Admitting: Hematology and Oncology

## 2021-04-18 ENCOUNTER — Other Ambulatory Visit: Payer: Self-pay

## 2021-04-18 ENCOUNTER — Inpatient Hospital Stay: Payer: Medicare HMO | Admitting: Emergency Medicine

## 2021-04-18 ENCOUNTER — Inpatient Hospital Stay: Payer: Medicare HMO

## 2021-04-18 DIAGNOSIS — R180 Malignant ascites: Secondary | ICD-10-CM

## 2021-04-18 DIAGNOSIS — Z5111 Encounter for antineoplastic chemotherapy: Secondary | ICD-10-CM | POA: Diagnosis not present

## 2021-04-18 DIAGNOSIS — C563 Malignant neoplasm of bilateral ovaries: Secondary | ICD-10-CM

## 2021-04-18 DIAGNOSIS — Z006 Encounter for examination for normal comparison and control in clinical research program: Secondary | ICD-10-CM

## 2021-04-18 LAB — CBC WITH DIFFERENTIAL (CANCER CENTER ONLY)
Abs Immature Granulocytes: 0.02 K/uL (ref 0.00–0.07)
Basophils Absolute: 0 K/uL (ref 0.0–0.1)
Basophils Relative: 0 %
Eosinophils Absolute: 0.1 K/uL (ref 0.0–0.5)
Eosinophils Relative: 1 %
HCT: 40 % (ref 36.0–46.0)
Hemoglobin: 13 g/dL (ref 12.0–15.0)
Immature Granulocytes: 0 %
Lymphocytes Relative: 20 %
Lymphs Abs: 1.8 K/uL (ref 0.7–4.0)
MCH: 28 pg (ref 26.0–34.0)
MCHC: 32.5 g/dL (ref 30.0–36.0)
MCV: 86 fL (ref 80.0–100.0)
Monocytes Absolute: 0.5 K/uL (ref 0.1–1.0)
Monocytes Relative: 5 %
Neutro Abs: 6.8 K/uL (ref 1.7–7.7)
Neutrophils Relative %: 74 %
Platelet Count: 462 K/uL — ABNORMAL HIGH (ref 150–400)
RBC: 4.65 MIL/uL (ref 3.87–5.11)
RDW: 13.2 % (ref 11.5–15.5)
WBC Count: 9.3 K/uL (ref 4.0–10.5)
nRBC: 0 % (ref 0.0–0.2)

## 2021-04-18 LAB — CMP (CANCER CENTER ONLY)
ALT: 6 U/L (ref 0–44)
AST: 17 U/L (ref 15–41)
Albumin: 3.1 g/dL — ABNORMAL LOW (ref 3.5–5.0)
Alkaline Phosphatase: 78 U/L (ref 38–126)
Anion gap: 8 (ref 5–15)
BUN: 21 mg/dL (ref 8–23)
CO2: 26 mmol/L (ref 22–32)
Calcium: 9.1 mg/dL (ref 8.9–10.3)
Chloride: 106 mmol/L (ref 98–111)
Creatinine: 0.74 mg/dL (ref 0.44–1.00)
GFR, Estimated: >60 mL/min (ref >60)
Glucose, Bld: 101 mg/dL — ABNORMAL HIGH (ref 70–99)
Potassium: 3.8 mmol/L (ref 3.5–5.1)
Sodium: 140 mmol/L (ref 135–145)
Total Bilirubin: 0.3 mg/dL (ref 0.3–1.2)
Total Protein: 6.8 g/dL (ref 6.5–8.1)

## 2021-04-18 LAB — RESEARCH LABS

## 2021-04-18 NOTE — Research (Signed)
[facility] [address] - Customer service manager for the Discovery and Validation of Biomarkers for the Prediction, Diagnosis, and Management of Disease  04/18/21  Informed Consent: Patient Karen Winters was identified by Dr. Alvy Bimler as a potential candidate for the above listed study.  This Clinical Research Coordinator met with Karen Winters, MRN [id number], on 04/18/21 in a manner and location that ensures patient privacy to discuss participation in the above listed research study.  Patient is Accompanied by her husband, Karen Winters.  A copy of the informed consent document and separate HIPAA Authorization was provided to the patient.  Patient reads, speaks, and understands [place].    As outlined in the informed consent form, this Coordinator and Karen Winters discussed the purpose of the research study, the investigational nature of the study, study procedures and requirements for study participation, potential risks and benefits of study participation, as well as alternatives to participation. The patient understands participation is voluntary and they may withdraw from study participation at any time.  Patient understands enrollment is pending full eligibility review.   Confidentiality and how the patient's information will be used as part of study participation were discussed.  Patient was informed there is reimbursement provided for their time and effort spent on trial participation.  The patient is encouraged to discuss research study participation with their insurance provider to determine what costs they may incur as part of study participation, including research related injury.    All questions were answered to patient's satisfaction.  The informed consent and separate HIPAA Authorization was reviewed page by page.  The patient's mental and emotional status is appropriate to provide informed consent, and the patient verbalizes an understanding of study participation.  Patient has agreed to  participate in the above listed research study and has voluntarily signed the informed consent version 2 and separate HIPAA Authorization, version 5 on 04/18/21 at 0723 AM.  The patient was provided with a copy of the signed informed consent form and separate HIPAA Authorization for their reference.  No study specific procedures were obtained prior to the signing of the informed consent document.  Approximately 10 minutes were spent with the patient reviewing the informed consent documents.  Eligibility:  This Coordinator has reviewed this patient's inclusion and exclusion criteria and confirmed Karen Winters is eligible for study participation.  Patient will continue with enrollment.  Second eligibility review was performed by Foye Spurling, RN, Clinical Research Nurse.  Specimen Collection:  Research blood specimen was collected at 0748 by phlebotomist Wilmon Arms.  Gift Card:  Patient was given a $50 visa gift card after blood collection.  The patient was thanked for her time and participation in this study.   Clabe Seal Clinical Research Coordinator I  04/18/21 8:15 AM

## 2021-04-19 ENCOUNTER — Encounter: Payer: Self-pay | Admitting: Hematology and Oncology

## 2021-04-19 LAB — CA 125: Cancer Antigen (CA) 125: 771 U/mL — ABNORMAL HIGH (ref 0.0–38.1)

## 2021-04-19 NOTE — Progress Notes (Signed)
Patient called after receiving my card per my request.  Introduced myself as Arboriculturist and to offer available resources.  Discussed one-time $1000 Radio broadcast assistant to assist with personal expenses while going through treatment. She states they would not qualify but would call me if she could think of anything else.  She has my card for any additional financial questions or concerns.

## 2021-04-22 ENCOUNTER — Encounter: Payer: Self-pay | Admitting: Hematology and Oncology

## 2021-04-22 ENCOUNTER — Other Ambulatory Visit: Payer: Self-pay | Admitting: Oncology

## 2021-04-22 ENCOUNTER — Inpatient Hospital Stay: Payer: Medicare HMO

## 2021-04-22 ENCOUNTER — Other Ambulatory Visit: Payer: Self-pay

## 2021-04-22 ENCOUNTER — Other Ambulatory Visit: Payer: Medicare HMO

## 2021-04-22 VITALS — BP 121/73 | HR 65 | Temp 97.8°F | Resp 18

## 2021-04-22 DIAGNOSIS — C563 Malignant neoplasm of bilateral ovaries: Secondary | ICD-10-CM

## 2021-04-22 DIAGNOSIS — R180 Malignant ascites: Secondary | ICD-10-CM

## 2021-04-22 DIAGNOSIS — Z5111 Encounter for antineoplastic chemotherapy: Secondary | ICD-10-CM | POA: Diagnosis not present

## 2021-04-22 MED ORDER — DIPHENHYDRAMINE HCL 50 MG/ML IJ SOLN
INTRAMUSCULAR | Status: AC
  Filled 2021-04-22: qty 1

## 2021-04-22 MED ORDER — COLD PACK MISC ONCOLOGY
1.0000 | Freq: Once | Status: DC | PRN
  Filled 2021-04-22: qty 1

## 2021-04-22 MED ORDER — PALONOSETRON HCL INJECTION 0.25 MG/5ML
0.2500 mg | Freq: Once | INTRAVENOUS | Status: AC
  Administered 2021-04-22: 0.25 mg via INTRAVENOUS

## 2021-04-22 MED ORDER — SODIUM CHLORIDE 0.9 % IV SOLN
451.2000 mg | Freq: Once | INTRAVENOUS | Status: AC
  Administered 2021-04-22: 450 mg via INTRAVENOUS
  Filled 2021-04-22: qty 45

## 2021-04-22 MED ORDER — SODIUM CHLORIDE 0.9 % IV SOLN
150.0000 mg | Freq: Once | INTRAVENOUS | Status: AC
  Administered 2021-04-22: 150 mg via INTRAVENOUS
  Filled 2021-04-22: qty 150

## 2021-04-22 MED ORDER — FAMOTIDINE 20 MG IN NS 100 ML IVPB
20.0000 mg | Freq: Once | INTRAVENOUS | Status: AC
  Administered 2021-04-22: 20 mg via INTRAVENOUS
  Filled 2021-04-22: qty 100

## 2021-04-22 MED ORDER — DIPHENHYDRAMINE HCL 50 MG/ML IJ SOLN
25.0000 mg | Freq: Once | INTRAMUSCULAR | Status: AC
  Administered 2021-04-22: 25 mg via INTRAVENOUS

## 2021-04-22 MED ORDER — PALONOSETRON HCL INJECTION 0.25 MG/5ML
INTRAVENOUS | Status: AC
  Filled 2021-04-22: qty 5

## 2021-04-22 MED ORDER — SODIUM CHLORIDE 0.9 % IV SOLN
Freq: Once | INTRAVENOUS | Status: AC
  Filled 2021-04-22: qty 250

## 2021-04-22 MED ORDER — SODIUM CHLORIDE 0.9 % IV SOLN
175.0000 mg/m2 | Freq: Once | INTRAVENOUS | Status: AC
  Administered 2021-04-22: 282 mg via INTRAVENOUS
  Filled 2021-04-22: qty 47

## 2021-04-22 MED ORDER — SODIUM CHLORIDE 0.9 % IV SOLN
10.0000 mg | Freq: Once | INTRAVENOUS | Status: AC
  Administered 2021-04-22: 10 mg via INTRAVENOUS
  Filled 2021-04-22: qty 10

## 2021-04-22 NOTE — Progress Notes (Signed)
Gynecologic Oncology Multi-Disciplinary Disposition Conference Note  Date of the Conference: 04/22/2021  Patient Name: Karen Winters  Referring Provider: Dr. Sabra Heck Primary GYN Oncologist: Dr. Berline Lopes  Stage/Disposition:  Probable stage III ovarian cancer. Disposition is to 3 cycles of adjuvant chemotherapy followed by repeat CT imaging and interval debulking surgery.   This Multidisciplinary conference took place involving physicians from [place], [place], Radiation Oncology, Pathology, Radiology along with the Gynecologic Oncology Nurse Practitioner and RN.  Comprehensive assessment of the patient's malignancy, staging, need for surgery, chemotherapy, radiation therapy, and need for further testing were reviewed. Supportive measures, both inpatient and following discharge were also discussed. The recommended plan of care is documented. Greater than 35 minutes were spent correlating and coordinating this patient's care.

## 2021-04-22 NOTE — Progress Notes (Signed)
OK to treat with today's heart rate per Dr. Alvy Bimler secure chat.

## 2021-04-22 NOTE — Patient Instructions (Signed)
[facility]  Discharge Instructions: Thank you for choosing [facility] to provide your oncology and hematology care.   If you have a lab appointment with the Cancer Center, please go directly to the Cancer Center and check in at the registration area.  Wear comfortable clothing and clothing appropriate for easy access to any Portacath or PICC line.   We strive to give you quality time with your provider. You may need to reschedule your appointment if you arrive late (15 or more minutes).  Arriving late affects you and other patients whose appointments are after yours.  Also, if you miss three or more appointments without notifying the office, you may be dismissed from the clinic at the provider's discretion.      For prescription refill requests, have your pharmacy contact our office and allow 72 hours for refills to be completed.    Today you received the following chemotherapy and/or immunotherapy agents Carboplatin, taxotere.      To help prevent nausea and vomiting after your treatment, we encourage you to take your nausea medication as directed.  BELOW ARE SYMPTOMS THAT SHOULD BE REPORTED IMMEDIATELY: *FEVER GREATER THAN 100.4 F (38 C) OR HIGHER *CHILLS OR SWEATING *NAUSEA AND VOMITING THAT IS NOT CONTROLLED WITH YOUR NAUSEA MEDICATION *UNUSUAL SHORTNESS OF BREATH *UNUSUAL BRUISING OR BLEEDING *URINARY PROBLEMS (pain or burning when urinating, or frequent urination) *BOWEL PROBLEMS (unusual diarrhea, constipation, pain near the anus) TENDERNESS IN MOUTH AND THROAT WITH OR WITHOUT PRESENCE OF ULCERS (sore throat, sores in mouth, or a toothache) UNUSUAL RASH, SWELLING OR PAIN  UNUSUAL VAGINAL DISCHARGE OR ITCHING   Items with * indicate a potential emergency and should be followed up as soon as possible or go to the Emergency Department if any problems should occur.  Please show the CHEMOTHERAPY ALERT CARD or IMMUNOTHERAPY ALERT CARD at  check-in to the Emergency Department and triage nurse. Should you have questions after your visit or need to cancel or reschedule your appointment, please contact [facility]  [phone] and follow the prompts.  Office hours are 8:00 a.m. to 4:30 p.m. Monday - Friday. Please note that voicemails left after 4:00 p.m. may not be returned until the following business day.  We are closed weekends and major holidays. You have access to a nurse at all times for urgent questions. Please call the main number to the clinic [phone] and follow the prompts.  For any non-urgent questions, you may also contact your provider using MyChart. We now offer e-Visits for anyone 18 and older to request care online for non-urgent symptoms. For details visit [email or web address].   Also download the MyChart app! Go to the app store, search "MyChart", open the app, select [facility], and log in with your MyChart username and password.  Due to Covid, a mask is required upon entering the hospital/clinic. If you do not have a mask, one will be given to you upon arrival. For doctor visits, patients may have 1 support person aged 18 or older with them. For treatment visits, patients cannot have anyone with them due to current Covid guidelines and our immunocompromised population.  

## 2021-04-30 ENCOUNTER — Telehealth: Payer: Self-pay

## 2021-04-30 NOTE — Telephone Encounter (Signed)
She can take 2 sennokot three times daily as needed along with miralax daily If no BM tomorrow, we will call in lactulose

## 2021-04-30 NOTE — Telephone Encounter (Signed)
Patient notified, will add Miralax and update tomorrow.

## 2021-04-30 NOTE — Telephone Encounter (Signed)
Patient called due to issues with constipation.   Patient is s/p D1C1 Taxol/Carboplatin for ovarian cancer.  Last BM 5/29, small.  Able to pass gas.  Denies any abdominal discomfort.  Reports drinking about 40oz of water daily.  Using senokot 1 tablet daily in AM and PM, patient increase dose to 4 tablets in AM per box instructions.   RN encouraged patient to increase fluid intake to 64 oz, encouraged movement/ambulation.  Pt prefers to hold on Miralax if possible.  Agreeable to try prune juice.  Patient will call back tomorrow to report update.    Will forward to MD for any possible recommendations.

## 2021-05-01 ENCOUNTER — Telehealth: Payer: Self-pay

## 2021-05-01 NOTE — Telephone Encounter (Signed)
She called and left a message to call her.  Called back. She had a large bm yesterday. She will continue Miralax and Senokot until she is more regular. Ask her to call the office if needed. She verbalized understanding.

## 2021-05-09 ENCOUNTER — Inpatient Hospital Stay: Payer: Medicare HMO | Attending: Gynecologic Oncology | Admitting: Genetic Counselor

## 2021-05-09 ENCOUNTER — Encounter: Payer: Self-pay | Admitting: Genetic Counselor

## 2021-05-09 ENCOUNTER — Other Ambulatory Visit: Payer: Self-pay

## 2021-05-09 ENCOUNTER — Telehealth: Payer: Self-pay | Admitting: Hematology and Oncology

## 2021-05-09 ENCOUNTER — Telehealth: Payer: Self-pay | Admitting: Oncology

## 2021-05-09 ENCOUNTER — Other Ambulatory Visit: Payer: Self-pay | Admitting: Genetic Counselor

## 2021-05-09 DIAGNOSIS — R180 Malignant ascites: Secondary | ICD-10-CM | POA: Insufficient documentation

## 2021-05-09 DIAGNOSIS — C563 Malignant neoplasm of bilateral ovaries: Secondary | ICD-10-CM

## 2021-05-09 DIAGNOSIS — Z79899 Other long term (current) drug therapy: Secondary | ICD-10-CM | POA: Insufficient documentation

## 2021-05-09 DIAGNOSIS — D72829 Elevated white blood cell count, unspecified: Secondary | ICD-10-CM | POA: Insufficient documentation

## 2021-05-09 DIAGNOSIS — C569 Malignant neoplasm of unspecified ovary: Secondary | ICD-10-CM | POA: Insufficient documentation

## 2021-05-09 DIAGNOSIS — Z8051 Family history of malignant neoplasm of kidney: Secondary | ICD-10-CM | POA: Insufficient documentation

## 2021-05-09 DIAGNOSIS — Z7952 Long term (current) use of systemic steroids: Secondary | ICD-10-CM | POA: Insufficient documentation

## 2021-05-09 DIAGNOSIS — Z5111 Encounter for antineoplastic chemotherapy: Secondary | ICD-10-CM | POA: Insufficient documentation

## 2021-05-09 DIAGNOSIS — R194 Change in bowel habit: Secondary | ICD-10-CM | POA: Insufficient documentation

## 2021-05-09 HISTORY — DX: Family history of malignant neoplasm of kidney: Z80.51

## 2021-05-09 NOTE — Progress Notes (Signed)
gen

## 2021-05-09 NOTE — Progress Notes (Signed)
REFERRING PROVIDER: Lafonda Mosses, MD [place],  [place] [address]  PRIMARY PROVIDER:  Berkley Harvey, NP  PRIMARY REASON FOR VISIT:  1. Malignant neoplasm of both ovaries   2. Family history of renal cancer     HISTORY OF PRESENT ILLNESS:   Karen Winters, a 70 y.o. female, was seen for a [facility] cancer genetics consultation at the request of Dr. Berline Lopes due to a personal history of cancer.  Karen Winters presents to clinic today to discuss the possibility of a hereditary predisposition to cancer, to discuss genetic testing, and to further clarify her future cancer risks, as well as potential cancer risks for family members.   In 2022, at the age of 44, Karen Winters was diagnosed with ovarian cancer. The treatment plan includes chemotherapy with interval debulking surgery.    CANCER HISTORY:  Oncology History  Ovarian cancer ([place])  04/01/2021 Initial Diagnosis   She notes that she noticed an umbilical hernia.  She was scheduled to see a surgeon about possible repair.  She also endorses intermittent pain in around the area of her right hip that she thought was secondary to her exercise routine.  Over the last week, she has had a decreased appetite.  She denies any nausea or emesis but endorses several months of abdominal bloating and early satiety.  She notes normal bowel function, which she describes as bowel movements at least daily.  She has had some increased urinary frequency and around [place] developed stress urinary incontinence when she runs.     04/04/2021 Imaging   US pelvis  Septate uterus, otherwise unremarkable.   Normal endometrial complex.   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   Recommend further assessment by CT imaging with IV and oral contrast.   04/05/2021 Tumor Marker   Patient's tumor was tested for the following markers: CA-125 Results of  the tumor marker test revealed 826.   04/05/2021 Imaging   1. Large bilateral solid-appearing adnexal masses are identified concerning for ovarian neoplasm. There is soft tissue infiltration into the surrounding peritoneal cavity which partially encases nonobstructed loops of small bowel with probable serosal involvement. 2. Moderate volume of ascites identified within the abdomen and pelvis likely secondary to peritoneal carcinomatosis. Diagnostic paracentesis may be helpful for further workup. 3. Mild increased caliber of small bowel loops with a few air-fluid levels. However, there are no signs to suggest a bowel obstruction as enteric contrast material is noted up to the level of the hepatic flexure.   04/10/2021 Initial Diagnosis   Ovarian cancer ([place])   04/10/2021 Procedure   Successful ultrasound-guided paracentesis yielding 1.4 liters of peritoneal fluid.   04/11/2021 Cancer Staging   Staging form: Ovary, Fallopian Tube, and Primary Peritoneal Carcinoma, AJCC 8th Edition - Clinical stage from 04/11/2021: Stage IIIC (cT3c, cN0, cM0) - Signed by Heath Lark, MD on 04/11/2021  Stage prefix: Initial diagnosis    04/18/2021 Tumor Marker   Patient's tumor was tested for the following markers: CA-125. Results of the tumor marker test revealed 202   04/22/2021 -  Chemotherapy    Patient is on Treatment Plan: OVARIAN CARBOPLATIN (AUC 6) / PACLITAXEL (175) Q21D X 6 CYCLES         RISK FACTORS:  Menarche was at age 58.  First live birth at age 16.  OCP use for approximately 8 years.  Ovaries intact: yes.  Hysterectomy: no.  Menopausal status: postmenopausal.  HRT use: 0 years. Colonoscopy: yes;  most recent in 2016; plans to follow up in 10 year intervals . Mammogram within the last year: yes. Number of breast biopsies: 0. Up to date with pelvic exams: yes. Any excessive radiation exposure in the past: no  Past Medical History:  Diagnosis Date   Endometriosis    Family history of  renal cancer 05/09/2021   History of anemia    Migraines    Osteoporosis     Past Surgical History:  Procedure Laterality Date   [place]   and BTSP   COLONOSCOPY     LAPAROSCOPY     for infertility-endometriosis   LAPAROSCOPY     2nd for infertility   mole removed     precancerous mole right upper abd   ORIF WRIST FRACTURE Right 08/02/2020   Procedure: Right distal radius open reduction internal fixation;  Surgeon: Verner Mould, MD;  Location: [facility];  Service: Orthopedics;  Laterality: Right;  58min   TONSILLECTOMY AND ADENOIDECTOMY      Social History   Socioeconomic History   Marital status: Married    Spouse name: Phil   Number of children: 2   Years of education: Not on file   Highest education level: Not on file  Occupational History   Occupation: retired  Tobacco Use   Smoking status: Former    Pack years: 0.00    Types: Cigarettes    Quit date: 12/02/1983    Years since quitting: 37.4   Smokeless tobacco: Never   Tobacco comments:    quit 1985  Vaping Use   Vaping Use: Never used  Substance and Sexual Activity   Alcohol use: Yes    Alcohol/week: 0.0 standard drinks    Comment: ocassional   Drug use: No   Sexual activity: Yes    Partners: Male    Birth control/protection: Surgical, Post-menopausal    Comment: BTL  Other Topics Concern   Not on file  Social History Narrative   Not on file   Social Determinants of Health   Financial Resource Strain: Not on file  Food Insecurity: Not on file  Transportation Needs: Not on file  Physical Activity: Not on file  Stress: Not on file  Social Connections: Not on file     FAMILY HISTORY:  We obtained a detailed, 4-generation family history.  Significant diagnoses are listed below: Family History  Problem Relation Age of Onset   Renal cancer Father 22       Agent Orange exposure    Karen Winters is unaware of previous family history of genetic testing for hereditary cancer  risks. Patient's maternal ancestors are of [place] descent, and paternal ancestors are of [place], [place], and [place] descent. There is no reported Ashkenazi Jewish ancestry. There is no known consanguinity.  GENETIC COUNSELING ASSESSMENT: Karen Winters is a 70 y.o. female with a personal history of cancer which is somewhat suggestive of a hereditary cancer syndrome and predisposition to cancer given diagnosis of bilateral ovarian cancer. We, therefore, discussed and recommended the following at today's visit.   DISCUSSION: We discussed that 15-20% of ovarian cancer is hereditary, with most cases of hereditary ovarian cancer associated with mutations in BRCA1/2.  There are other genes that can be associated with hereditary ovarian cancer syndromes.  Type of cancer risk and level of risk is gene-specific.  We discussed that testing is beneficial for several reasons including knowing how to follow  individuals after completing their treatment, identifying whether potential treatment options such as PARP inhibitors would be beneficial, and understanding if other family members could be at risk for cancer and allowing them to undergo genetic testing.   We reviewed the characteristics, features and inheritance patterns of hereditary cancer syndromes. We also discussed genetic testing, including the appropriate family members to test, the process of testing, insurance coverage and turn-around-time for results. We discussed the implications of a negative, positive, carrier and/or variant of uncertain significant result. We recommended Karen Winters pursue genetic testing for a panel that includes genes associated with ovarian and other types of cancer as well HRD testing cancer. We discussed that genetic testing through [facility] will test for hereditary mutations that could explain her diagnosis of cancer.  However, homologous recombination testing (HRD) is genetic testing performed on the tumor that can determine genetic  changes that could influence her management such as eligibility for targeted therapies.  HRD testing can be performed in tandem with germline genetic testing.   TumorNext-HRD+CancerNext through [employer] will be ordered on behalf of Karen Winters after tumor debulking surgery.  We discussed the methodology, benefits, and limitations of this test as well as differences between tumor and germline analysis. The CancerNext gene panel offered by [employer] includes sequencing and rearrangement analysis for the following 37 genes:   APC, ATM, AXIN2 BARD1, BMPR1A, BRCA1, BRCA2, BRIP1, CDH1, CDK4, CDKN2A, CHEK2, DICER1, HOXB13, EPCAM, GREM1, MLH1, MRE11A, MSH2, MSH3, MSH6, MUTYH, NBN, NF1, PALB2, PMS2, POLD1, POLE, PTEN, RAD50, RAD51C, RAD51D, RECQL, SMAD4, SMARCA4, STK11, and TP53. TumorNext-HRD is a paired tumor and germline analysis of BRCA1 and BRCA2 plus 9 additional genes in the homologous recombination repair pathway (ATM, BARD1, BRCA1, BRCA2, BRIP1, CHEK2, MRE11A, NBN, PALB2, RAD51C, RAD51D).  Based on Karen Winters's personal history of cancer, she meets medical criteria for genetic testing. Despite that she meets criteria, she may still have an out of pocket cost.   PLAN: After considering the risks, benefits, and limitations, Karen Winters provided informed consent to pursue genetic testing.  The blood sample will be collected on 05/14/2021 when she is at the [facility] for other appointments.  The blood sample will be sent to [facility] and placed on hold until the tumor sample is available for TumorNext-HRD testing. Results should be available within approximately 4-6 weeks after all specimens are sent to the lab, at which point they will be disclosed by telephone to Karen Winters, as will any additional recommendations warranted by these results. Karen Winters will receive a summary of her genetic counseling visit and a copy of her results once available. This information will also be available in Epic.    Lastly, we encouraged Karen Winters to remain in contact with cancer genetics annually so that we can continuously update the family history and inform her of any changes in cancer genetics and testing that may be of benefit for this family.   Karen Winters questions were answered to her satisfaction today. Our contact information was provided should additional questions or concerns arise. Thank you for the referral and allowing [place] to share in the care of your patient.   Karen Winters M. Joette Catching, [place], [facility] Genetic Counselor [email or web address] (P) [phone]  The patient was seen for a total of 25 minutes in face-to-face genetic counseling.  The patient was seen alone.  Drs. Magrinat, Lindi Adie and/or Burr Medico were available to discuss this case as needed.    _______________________________________________________________________ For Office Staff:  Number of people involved in  session: 1 Was an Intern/ student involved with case: no

## 2021-05-09 NOTE — Telephone Encounter (Signed)
Left a message regarding patient's question about scheduling 3rd cycle of chemo at [facility].  Requested a return call.

## 2021-05-09 NOTE — Telephone Encounter (Signed)
Scheduled per 6/8 los. Pt will receive an updated appt calendar per next visit appt notes

## 2021-05-14 ENCOUNTER — Inpatient Hospital Stay: Payer: Medicare HMO | Admitting: Hematology and Oncology

## 2021-05-14 ENCOUNTER — Inpatient Hospital Stay: Payer: Medicare HMO

## 2021-05-14 ENCOUNTER — Telehealth: Payer: Self-pay | Admitting: Hematology and Oncology

## 2021-05-14 ENCOUNTER — Other Ambulatory Visit: Payer: Self-pay

## 2021-05-14 ENCOUNTER — Encounter: Payer: Self-pay | Admitting: Hematology and Oncology

## 2021-05-14 ENCOUNTER — Telehealth: Payer: Self-pay | Admitting: *Deleted

## 2021-05-14 VITALS — BP 130/85 | HR 77 | Temp 97.4°F | Resp 18 | Ht 64.25 in | Wt 120.0 lb

## 2021-05-14 DIAGNOSIS — R180 Malignant ascites: Secondary | ICD-10-CM | POA: Diagnosis not present

## 2021-05-14 DIAGNOSIS — D72825 Bandemia: Secondary | ICD-10-CM | POA: Diagnosis not present

## 2021-05-14 DIAGNOSIS — C563 Malignant neoplasm of bilateral ovaries: Secondary | ICD-10-CM

## 2021-05-14 DIAGNOSIS — R194 Change in bowel habit: Secondary | ICD-10-CM | POA: Diagnosis not present

## 2021-05-14 DIAGNOSIS — Z7952 Long term (current) use of systemic steroids: Secondary | ICD-10-CM | POA: Diagnosis not present

## 2021-05-14 DIAGNOSIS — D72829 Elevated white blood cell count, unspecified: Secondary | ICD-10-CM

## 2021-05-14 DIAGNOSIS — Z5111 Encounter for antineoplastic chemotherapy: Secondary | ICD-10-CM | POA: Diagnosis not present

## 2021-05-14 DIAGNOSIS — Z79899 Other long term (current) drug therapy: Secondary | ICD-10-CM | POA: Diagnosis not present

## 2021-05-14 DIAGNOSIS — C569 Malignant neoplasm of unspecified ovary: Secondary | ICD-10-CM | POA: Diagnosis present

## 2021-05-14 HISTORY — DX: Elevated white blood cell count, unspecified: D72.829

## 2021-05-14 LAB — CBC WITH DIFFERENTIAL (CANCER CENTER ONLY)
Abs Immature Granulocytes: 0.11 10*3/uL — ABNORMAL HIGH (ref 0.00–0.07)
Basophils Absolute: 0 10*3/uL (ref 0.0–0.1)
Basophils Relative: 0 %
Eosinophils Absolute: 0 10*3/uL (ref 0.0–0.5)
Eosinophils Relative: 0 %
HCT: 39.7 % (ref 36.0–46.0)
Hemoglobin: 13 g/dL (ref 12.0–15.0)
Immature Granulocytes: 1 %
Lymphocytes Relative: 8 %
Lymphs Abs: 1.3 10*3/uL (ref 0.7–4.0)
MCH: 27.7 pg (ref 26.0–34.0)
MCHC: 32.7 g/dL (ref 30.0–36.0)
MCV: 84.6 fL (ref 80.0–100.0)
Monocytes Absolute: 0.5 10*3/uL (ref 0.1–1.0)
Monocytes Relative: 3 %
Neutro Abs: 14.7 10*3/uL — ABNORMAL HIGH (ref 1.7–7.7)
Neutrophils Relative %: 88 %
Platelet Count: 409 10*3/uL — ABNORMAL HIGH (ref 150–400)
RBC: 4.69 MIL/uL (ref 3.87–5.11)
RDW: 13.6 % (ref 11.5–15.5)
WBC Count: 16.6 10*3/uL — ABNORMAL HIGH (ref 4.0–10.5)
nRBC: 0 % (ref 0.0–0.2)

## 2021-05-14 LAB — CMP (CANCER CENTER ONLY)
ALT: 9 U/L (ref 0–44)
AST: 14 U/L — ABNORMAL LOW (ref 15–41)
Albumin: 3.1 g/dL — ABNORMAL LOW (ref 3.5–5.0)
Alkaline Phosphatase: 84 U/L (ref 38–126)
Anion gap: 13 (ref 5–15)
BUN: 20 mg/dL (ref 8–23)
CO2: 21 mmol/L — ABNORMAL LOW (ref 22–32)
Calcium: 9.6 mg/dL (ref 8.9–10.3)
Chloride: 107 mmol/L (ref 98–111)
Creatinine: 0.7 mg/dL (ref 0.44–1.00)
GFR, Estimated: >60 mL/min (ref >60)
Glucose, Bld: 144 mg/dL — ABNORMAL HIGH (ref 70–99)
Potassium: 4 mmol/L (ref 3.5–5.1)
Sodium: 141 mmol/L (ref 135–145)
Total Bilirubin: <0.2 mg/dL — ABNORMAL LOW (ref 0.3–1.2)
Total Protein: 7.3 g/dL (ref 6.5–8.1)

## 2021-05-14 LAB — GENETIC SCREENING ORDER

## 2021-05-14 MED ORDER — SODIUM CHLORIDE 0.9 % IV SOLN
Freq: Once | INTRAVENOUS | Status: AC
  Filled 2021-05-14: qty 250

## 2021-05-14 MED ORDER — DIPHENHYDRAMINE HCL 50 MG/ML IJ SOLN
25.0000 mg | Freq: Once | INTRAMUSCULAR | Status: AC
  Administered 2021-05-14: 25 mg via INTRAVENOUS

## 2021-05-14 MED ORDER — PALONOSETRON HCL INJECTION 0.25 MG/5ML
INTRAVENOUS | Status: AC
  Filled 2021-05-14: qty 5

## 2021-05-14 MED ORDER — PALONOSETRON HCL INJECTION 0.25 MG/5ML
0.2500 mg | Freq: Once | INTRAVENOUS | Status: AC
  Administered 2021-05-14: 0.25 mg via INTRAVENOUS

## 2021-05-14 MED ORDER — DIPHENHYDRAMINE HCL 50 MG/ML IJ SOLN
INTRAMUSCULAR | Status: AC
  Filled 2021-05-14: qty 1

## 2021-05-14 MED ORDER — SODIUM CHLORIDE 0.9 % IV SOLN
451.2000 mg | Freq: Once | INTRAVENOUS | Status: AC
  Administered 2021-05-14: 450 mg via INTRAVENOUS
  Filled 2021-05-14: qty 45

## 2021-05-14 MED ORDER — SODIUM CHLORIDE 0.9 % IV SOLN
150.0000 mg | Freq: Once | INTRAVENOUS | Status: AC
  Administered 2021-05-14: 150 mg via INTRAVENOUS
  Filled 2021-05-14: qty 150

## 2021-05-14 MED ORDER — FAMOTIDINE 20 MG IN NS 100 ML IVPB
INTRAVENOUS | Status: AC
  Filled 2021-05-14: qty 100

## 2021-05-14 MED ORDER — SODIUM CHLORIDE 0.9 % IV SOLN
175.0000 mg/m2 | Freq: Once | INTRAVENOUS | Status: AC
  Administered 2021-05-14: 282 mg via INTRAVENOUS
  Filled 2021-05-14: qty 47

## 2021-05-14 MED ORDER — FAMOTIDINE 20 MG IN NS 100 ML IVPB
20.0000 mg | Freq: Once | INTRAVENOUS | Status: AC
  Administered 2021-05-14: 20 mg via INTRAVENOUS

## 2021-05-14 MED ORDER — SODIUM CHLORIDE 0.9 % IV SOLN
10.0000 mg | Freq: Once | INTRAVENOUS | Status: AC
  Administered 2021-05-14: 10 mg via INTRAVENOUS
  Filled 2021-05-14: qty 10

## 2021-05-14 NOTE — Telephone Encounter (Signed)
Per Dr Alvy Bimler, called the patient and scheduled a follow up appt for 7/22

## 2021-05-14 NOTE — Assessment & Plan Note (Signed)
Overall, her abdomen is soft I do not feel that she has persistent ascites She had some side effects from treatment but not unexpected We will proceed with treatment as scheduled I have given her instruction to schedule CT imaging to be done on July 20 I will arrange for her to see Dr. Berline Lopes on July 22 for objective assessment of response to treatment and to see if interval debulking is appropriate I will tentatively schedule chemotherapy at the end of July, should she require further treatment

## 2021-05-14 NOTE — Patient Instructions (Signed)
[facility]  Discharge Instructions: Thank you for choosing [facility] to provide your oncology and hematology care.   If you have a lab appointment with the Cancer Center, please go directly to the Cancer Center and check in at the registration area.   Wear comfortable clothing and clothing appropriate for easy access to any Portacath or PICC line.   We strive to give you quality time with your provider. You may need to reschedule your appointment if you arrive late (15 or more minutes).  Arriving late affects you and other patients whose appointments are after yours.  Also, if you miss three or more appointments without notifying the office, you may be dismissed from the clinic at the provider's discretion.      For prescription refill requests, have your pharmacy contact our office and allow 72 hours for refills to be completed.    Today you received the following chemotherapy and/or immunotherapy agents: Taxol & Carbo   To help prevent nausea and vomiting after your treatment, we encourage you to take your nausea medication as directed.  BELOW ARE SYMPTOMS THAT SHOULD BE REPORTED IMMEDIATELY: *FEVER GREATER THAN 100.4 F (38 C) OR HIGHER *CHILLS OR SWEATING *NAUSEA AND VOMITING THAT IS NOT CONTROLLED WITH YOUR NAUSEA MEDICATION *UNUSUAL SHORTNESS OF BREATH *UNUSUAL BRUISING OR BLEEDING *URINARY PROBLEMS (pain or burning when urinating, or frequent urination) *BOWEL PROBLEMS (unusual diarrhea, constipation, pain near the anus) TENDERNESS IN MOUTH AND THROAT WITH OR WITHOUT PRESENCE OF ULCERS (sore throat, sores in mouth, or a toothache) UNUSUAL RASH, SWELLING OR PAIN  UNUSUAL VAGINAL DISCHARGE OR ITCHING   Items with * indicate a potential emergency and should be followed up as soon as possible or go to the Emergency Department if any problems should occur.  Please show the CHEMOTHERAPY ALERT CARD or IMMUNOTHERAPY ALERT CARD at check-in to  the Emergency Department and triage nurse.  Should you have questions after your visit or need to cancel or reschedule your appointment, please contact [facility]  Dept: [phone]  and follow the prompts.  Office hours are 8:00 a.m. to 4:30 p.m. Monday - Friday. Please note that voicemails left after 4:00 p.m. may not be returned until the following business day.  We are closed weekends and major holidays. You have access to a nurse at all times for urgent questions. Please call the main number to the clinic Dept: [phone] and follow the prompts.   For any non-urgent questions, you may also contact your provider using MyChart. We now offer e-Visits for anyone 18 and older to request care online for non-urgent symptoms. For details visit [email or web address].   Also download the MyChart app! Go to the app store, search "MyChart", open the app, select [facility], and log in with your MyChart username and password.  Due to Covid, a mask is required upon entering the hospital/clinic. If you do not have a mask, one will be given to you upon arrival. For doctor visits, patients may have 1 support person aged 18 or older with them. For treatment visits, patients cannot have anyone with them due to current Covid guidelines and our immunocompromised population.   Paclitaxel injection What is this medication? PACLITAXEL (PAK li TAX el) is a chemotherapy drug. It targets fast dividing cells, like cancer cells, and causes these cells to die. This medicine is used to treat ovarian cancer, breast cancer, lung cancer, Kaposi's sarcoma, and other cancers. This medicine may be   used for other purposes; ask your health care provider or pharmacist if you have questions. COMMON BRAND NAME(S): Onxol, Taxol What should I tell my care team before I take this medication? They need to know if you have any of these conditions: history of irregular heartbeat liver disease low blood  counts, like low white cell, platelet, or red cell counts lung or breathing disease, like asthma tingling of the fingers or toes, or other nerve disorder an unusual or allergic reaction to paclitaxel, alcohol, polyoxyethylated castor oil, other chemotherapy, other medicines, foods, dyes, or preservatives pregnant or trying to get pregnant breast-feeding How should I use this medication? This drug is given as an infusion into a vein. It is administered in a hospital or clinic by a specially trained health care professional. Talk to your pediatrician regarding the use of this medicine in children. Special care may be needed. Overdosage: If you think you have taken too much of this medicine contact a poison control center or emergency room at once. NOTE: This medicine is only for you. Do not share this medicine with others. What if I miss a dose? It is important not to miss your dose. Call your doctor or health care professional if you are unable to keep an appointment. What may interact with this medication? Do not take this medicine with any of the following medications: live virus vaccines This medicine may also interact with the following medications: antiviral medicines for hepatitis, HIV or AIDS certain antibiotics like erythromycin and clarithromycin certain medicines for fungal infections like ketoconazole and itraconazole certain medicines for seizures like carbamazepine, phenobarbital, phenytoin gemfibrozil nefazodone rifampin St. John's wort This list may not describe all possible interactions. Give your health care provider a list of all the medicines, herbs, non-prescription drugs, or dietary supplements you use. Also tell them if you smoke, drink alcohol, or use illegal drugs. Some items may interact with your medicine. What should I watch for while using this medication? Your condition will be monitored carefully while you are receiving this medicine. You will need important  blood work done while you are taking this medicine. This medicine can cause serious allergic reactions. To reduce your risk you will need to take other medicine(s) before treatment with this medicine. If you experience allergic reactions like skin rash, itching or hives, swelling of the face, lips, or tongue, tell your doctor or health care professional right away. In some cases, you may be given additional medicines to help with side effects. Follow all directions for their use. This drug may make you feel generally unwell. This is not uncommon, as chemotherapy can affect healthy cells as well as cancer cells. Report any side effects. Continue your course of treatment even though you feel ill unless your doctor tells you to stop. Call your doctor or health care professional for advice if you get a fever, chills or sore throat, or other symptoms of a cold or flu. Do not treat yourself. This drug decreases your body's ability to fight infections. Try to avoid being around people who are sick. This medicine may increase your risk to bruise or bleed. Call your doctor or health care professional if you notice any unusual bleeding. Be careful brushing and flossing your teeth or using a toothpick because you may get an infection or bleed more easily. If you have any dental work done, tell your dentist you are receiving this medicine. Avoid taking products that contain aspirin, acetaminophen, ibuprofen, naproxen, or ketoprofen unless instructed by your doctor.   These medicines may hide a fever. Do not become pregnant while taking this medicine. Women should inform their doctor if they wish to become pregnant or think they might be pregnant. There is a potential for serious side effects to an unborn child. Talk to your health care professional or pharmacist for more information. Do not breast-feed an infant while taking this medicine. Men are advised not to father a child while receiving this medicine. This product  may contain alcohol. Ask your pharmacist or healthcare provider if this medicine contains alcohol. Be sure to tell all healthcare providers you are taking this medicine. Certain medicines, like metronidazole and disulfiram, can cause an unpleasant reaction when taken with alcohol. The reaction includes flushing, headache, nausea, vomiting, sweating, and increased thirst. The reaction can last from 30 minutes to several hours. What side effects may I notice from receiving this medication? Side effects that you should report to your doctor or health care professional as soon as possible: allergic reactions like skin rash, itching or hives, swelling of the face, lips, or tongue breathing problems changes in vision fast, irregular heartbeat high or low blood pressure mouth sores pain, tingling, numbness in the hands or feet signs of decreased platelets or bleeding - bruising, pinpoint red spots on the skin, black, tarry stools, blood in the urine signs of decreased red blood cells - unusually weak or tired, feeling faint or lightheaded, falls signs of infection - fever or chills, cough, sore throat, pain or difficulty passing urine signs and symptoms of liver injury like dark yellow or brown urine; general ill feeling or flu-like symptoms; light-colored stools; loss of appetite; nausea; right upper belly pain; unusually weak or tired; yellowing of the eyes or skin swelling of the ankles, feet, hands unusually slow heartbeat Side effects that usually do not require medical attention (report to your doctor or health care professional if they continue or are bothersome): diarrhea hair loss loss of appetite muscle or joint pain nausea, vomiting pain, redness, or irritation at site where injected tiredness This list may not describe all possible side effects. Call your doctor for medical advice about side effects. You may report side effects to FDA at 1-800-FDA-1088. Where should I keep my  medication? This drug is given in a hospital or clinic and will not be stored at home. NOTE: This sheet is a summary. It may not cover all possible information. If you have questions about this medicine, talk to your doctor, pharmacist, or health care provider.  2022 Elsevier/Gold Standard (2019-10-19 13:37:23)  Carboplatin injection What is this medication? CARBOPLATIN (KAR boe pla tin) is a chemotherapy drug. It targets fast dividing cells, like cancer cells, and causes these cells to die. This medicine is used to treat ovarian cancer and many other cancers. This medicine may be used for other purposes; ask your health care provider or pharmacist if you have questions. COMMON BRAND NAME(S): Paraplatin What should I tell my care team before I take this medication? They need to know if you have any of these conditions: blood disorders hearing problems kidney disease recent or ongoing radiation therapy an unusual or allergic reaction to carboplatin, cisplatin, other chemotherapy, other medicines, foods, dyes, or preservatives pregnant or trying to get pregnant breast-feeding How should I use this medication? This drug is usually given as an infusion into a vein. It is administered in a hospital or clinic by a specially trained health care professional. Talk to your pediatrician regarding the use of this medicine in children. Special   care may be needed. Overdosage: If you think you have taken too much of this medicine contact a poison control center or emergency room at once. NOTE: This medicine is only for you. Do not share this medicine with others. What if I miss a dose? It is important not to miss a dose. Call your doctor or health care professional if you are unable to keep an appointment. What may interact with this medication? medicines for seizures medicines to increase blood counts like filgrastim, pegfilgrastim, sargramostim some antibiotics like amikacin, gentamicin, neomycin,  streptomycin, tobramycin vaccines Talk to your doctor or health care professional before taking any of these medicines: acetaminophen aspirin ibuprofen ketoprofen naproxen This list may not describe all possible interactions. Give your health care provider a list of all the medicines, herbs, non-prescription drugs, or dietary supplements you use. Also tell them if you smoke, drink alcohol, or use illegal drugs. Some items may interact with your medicine. What should I watch for while using this medication? Your condition will be monitored carefully while you are receiving this medicine. You will need important blood work done while you are taking this medicine. This drug may make you feel generally unwell. This is not uncommon, as chemotherapy can affect healthy cells as well as cancer cells. Report any side effects. Continue your course of treatment even though you feel ill unless your doctor tells you to stop. In some cases, you may be given additional medicines to help with side effects. Follow all directions for their use. Call your doctor or health care professional for advice if you get a fever, chills or sore throat, or other symptoms of a cold or flu. Do not treat yourself. This drug decreases your body's ability to fight infections. Try to avoid being around people who are sick. This medicine may increase your risk to bruise or bleed. Call your doctor or health care professional if you notice any unusual bleeding. Be careful brushing and flossing your teeth or using a toothpick because you may get an infection or bleed more easily. If you have any dental work done, tell your dentist you are receiving this medicine. Avoid taking products that contain aspirin, acetaminophen, ibuprofen, naproxen, or ketoprofen unless instructed by your doctor. These medicines may hide a fever. Do not become pregnant while taking this medicine. Women should inform their doctor if they wish to become pregnant or  think they might be pregnant. There is a potential for serious side effects to an unborn child. Talk to your health care professional or pharmacist for more information. Do not breast-feed an infant while taking this medicine. What side effects may I notice from receiving this medication? Side effects that you should report to your doctor or health care professional as soon as possible: allergic reactions like skin rash, itching or hives, swelling of the face, lips, or tongue signs of infection - fever or chills, cough, sore throat, pain or difficulty passing urine signs of decreased platelets or bleeding - bruising, pinpoint red spots on the skin, black, tarry stools, nosebleeds signs of decreased red blood cells - unusually weak or tired, fainting spells, lightheadedness breathing problems changes in hearing changes in vision chest pain high blood pressure low blood counts - This drug may decrease the number of white blood cells, red blood cells and platelets. You may be at increased risk for infections and bleeding. nausea and vomiting pain, swelling, redness or irritation at the injection site pain, tingling, numbness in the hands or feet problems with   balance, talking, walking trouble passing urine or change in the amount of urine Side effects that usually do not require medical attention (report to your doctor or health care professional if they continue or are bothersome): hair loss loss of appetite metallic taste in the mouth or changes in taste This list may not describe all possible side effects. Call your doctor for medical advice about side effects. You may report side effects to FDA at 1-800-FDA-1088. Where should I keep my medication? This drug is given in a hospital or clinic and will not be stored at home. NOTE: This sheet is a summary. It may not cover all possible information. If you have questions about this medicine, talk to your doctor, pharmacist, or health care  provider.  2022 Elsevier/Gold Standard (2008-02-22 14:38:05)  

## 2021-05-14 NOTE — Progress Notes (Signed)
[facility] OFFICE PROGRESS NOTE  Patient Care Team: Berkley Harvey, NP as PCP - General (Nurse Practitioner) Jacqulyn Liner, RN as Oncology Nurse Navigator (Oncology)  ASSESSMENT & PLAN:  Ovarian cancer [facility]) Overall, her abdomen is soft I do not feel that she has persistent ascites She had some side effects from treatment but not unexpected We will proceed with treatment as scheduled I have given her instruction to schedule CT imaging to be done on July 20 I will arrange for her to see Dr. Berline Lopes on July 22 for objective assessment of response to treatment and to see if interval debulking is appropriate I will tentatively schedule chemotherapy at the end of July, should she require further treatment  Change in bowel habits She has regular bowel movement now She is aware that risk of constipation with further treatment She is regulating her bowel habits with laxatives  Leukocytosis This is due to steroids Observe for now  Malignant ascites I did not appreciate any evidence of ascites today Observe closely  Orders Placed This Encounter  Procedures   CT CHEST ABDOMEN PELVIS W CONTRAST    Standing Status:   Future    Standing Expiration Date:   05/14/2022    Order Specific Question:   Preferred imaging location?    Answer:   [facility]    Order Specific Question:   Radiology Contrast Protocol - do NOT remove file path    Answer:   [email or web address]    All questions were answered. The patient knows to call the clinic with any problems, questions or concerns. The total time spent in the appointment was 20 minutes encounter with patients including review of chart and various tests results, discussions about plan of care and coordination of care plan   Heath Lark, MD 05/14/2021 3:23 PM  INTERVAL HISTORY: Please see below for problem oriented charting. She is seen prior to cycle 2 of treatment She is doing  well Her appetite is fair She is able to regulate her bowel habits with laxatives She denies significant abdominal bloating Denies peripheral neuropathy from treatment  SUMMARY OF ONCOLOGIC HISTORY: Oncology History  Ovarian cancer ([place])  04/01/2021 Initial Diagnosis   She notes that she noticed an umbilical hernia.  She was scheduled to see a surgeon about possible repair.  She also endorses intermittent pain in around the area of her right hip that she thought was secondary to her exercise routine.  Over the last week, she has had a decreased appetite.  She denies any nausea or emesis but endorses several months of abdominal bloating and early satiety.  She notes normal bowel function, which she describes as bowel movements at least daily.  She has had some increased urinary frequency and around [place] developed stress urinary incontinence when she runs.     04/04/2021 Imaging   US pelvis  Septate uterus, otherwise unremarkable.   Normal endometrial complex.   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   Recommend further assessment by CT imaging with IV and oral contrast.   04/05/2021 Tumor Marker   Patient's tumor was tested for the following markers: CA-125 Results of the tumor marker test revealed 826.   04/05/2021 Imaging   1. Large bilateral solid-appearing adnexal masses are identified concerning for ovarian neoplasm. There is soft tissue infiltration into the surrounding peritoneal cavity which partially encases nonobstructed loops of small bowel with probable serosal involvement.  2. Moderate volume of ascites identified within the abdomen and pelvis likely secondary to peritoneal carcinomatosis. Diagnostic paracentesis may be helpful for further workup. 3. Mild increased caliber of small bowel loops with a few air-fluid levels. However, there are no signs to suggest a bowel  obstruction as enteric contrast material is noted up to the level of the hepatic flexure.   04/10/2021 Initial Diagnosis   Ovarian cancer ([place])   04/10/2021 Procedure   Successful ultrasound-guided paracentesis yielding 1.4 liters of peritoneal fluid.   04/11/2021 Cancer Staging   Staging form: Ovary, Fallopian Tube, and Primary Peritoneal Carcinoma, AJCC 8th Edition - Clinical stage from 04/11/2021: Stage IIIC (cT3c, cN0, cM0) - Signed by Heath Lark, MD on 04/11/2021  Stage prefix: Initial diagnosis    04/18/2021 Tumor Marker   Patient's tumor was tested for the following markers: CA-125. Results of the tumor marker test revealed 740   04/22/2021 -  Chemotherapy    Patient is on Treatment Plan: OVARIAN CARBOPLATIN (AUC 6) / PACLITAXEL (175) Q21D X 6 CYCLES         REVIEW OF SYSTEMS:   Constitutional: Denies fevers, chills or abnormal weight loss Eyes: Denies blurriness of vision Ears, nose, mouth, throat, and face: Denies mucositis or sore throat Respiratory: Denies cough, dyspnea or wheezes Cardiovascular: Denies palpitation, chest discomfort or lower extremity swelling Gastrointestinal:  Denies nausea, heartburn or change in bowel habits Skin: Denies abnormal skin rashes Lymphatics: Denies new lymphadenopathy or easy bruising Neurological:Denies numbness, tingling or new weaknesses Behavioral/Psych: Mood is stable, no new changes  All other systems were reviewed with the patient and are negative.  I have reviewed the past medical history, past surgical history, social history and family history with the patient and they are unchanged from previous note.  ALLERGIES:  is allergic to bee venom, latex, mushroom extract complex, penicillins, and tape.  MEDICATIONS:  Current Outpatient Medications  Medication Sig Dispense Refill   acetaminophen (TYLENOL) 325 MG tablet Take 650 mg by mouth every 6 (six) hours as needed for mild pain.      Calcium-Magnesium-Vitamin D (CALCIUM 500  PO) Take 500 mg by mouth daily.      dexamethasone (DECADRON) 4 MG tablet Take 2 tabs at the night before and 2 tab the morning of chemotherapy, every 3 weeks, by mouth x 6 cycles 36 tablet 6   EPINEPHrine 0.3 mg/0.3 mL IJ SOAJ injection Inject 0.3 mg as directed as needed (allergic reaction).      guaiFENesin (MUCINEX) 600 MG 12 hr tablet Take 600 mg by mouth 2 (two) times daily.     ibuprofen (ADVIL) 200 MG tablet Take 400 mg by mouth every 6 (six) hours as needed for mild pain or moderate pain.     Misc Natural Products (OSTEO BI-FLEX JOINT SHIELD PO) Take 1 tablet by mouth daily.      Multiple Vitamins-Minerals (MULTIVITAMIN PO) Take 1 tablet by mouth daily.      ondansetron (ZOFRAN) 8 MG tablet Take 1 tablet (8 mg total) by mouth 2 (two) times daily as needed for refractory nausea / vomiting. Start on day 3 after carboplatin chemo. 30 tablet 1   prochlorperazine (COMPAZINE) 10 MG tablet Take 1 tablet (10 mg total) by mouth every 6 (six) hours as needed (Nausea or vomiting). 30 tablet 1   SUMAtriptan (IMITREX) 100 MG tablet 1 tab with headache onset.  Can repeat in 2 hours.  Max dosage 200mg /24 hours. (Patient not taking: Reported on 04/09/2021) 27 tablet 4  No current facility-administered medications for this visit.   Facility-Administered Medications Ordered in Other Visits  Medication Dose Route Frequency Provider Last Rate Last Admin   CARBOplatin (PARAPLATIN) 450 mg in sodium chloride 0.9 % 250 mL chemo infusion  450 mg Intravenous Once Alvy Bimler, Eligah Anello, MD       PACLitaxel (TAXOL) 282 mg in sodium chloride 0.9 % 250 mL chemo infusion (> 80mg /m2)  175 mg/m2 (Order-Specific) Intravenous Once Heath Lark, MD 99 mL/hr at 05/14/21 1326 282 mg at 05/14/21 1326    PHYSICAL EXAMINATION: ECOG PERFORMANCE STATUS: 1 - Symptomatic but completely ambulatory  Vitals:   05/14/21 1036  BP: 130/85  Pulse: 77  Resp: 18  Temp: (!) 97.4 F (36.3 C)  SpO2: 98%   Filed Weights   05/14/21 1036   Weight: 120 lb (54.4 kg)    GENERAL:alert, no distress and comfortable SKIN: skin color, texture, turgor are normal, no rashes or significant lesions EYES: normal, Conjunctiva are pink and non-injected, sclera clear OROPHARYNX:no exudate, no erythema and lips, buccal mucosa, and tongue normal  NECK: supple, thyroid normal size, non-tender, without nodularity LYMPH:  no palpable lymphadenopathy in the cervical, axillary or inguinal LUNGS: clear to auscultation and percussion with normal breathing effort HEART: regular rate & rhythm and no murmurs and no lower extremity edema ABDOMEN:abdomen soft, non-tender and normal bowel sounds Musculoskeletal:no cyanosis of digits and no clubbing  NEURO: alert & oriented x 3 with fluent speech, no focal motor/sensory deficits  LABORATORY DATA:  I have reviewed the data as listed    Component Value Date/Time   NA 141 05/14/2021 0938   K 4.0 05/14/2021 0938   CL 107 05/14/2021 0938   CO2 21 (L) 05/14/2021 0938   GLUCOSE 144 (H) 05/14/2021 0938   BUN 20 05/14/2021 0938   CREATININE 0.70 05/14/2021 0938   CREATININE 0.72 01/13/2014 1552   CALCIUM 9.6 05/14/2021 0938   PROT 7.3 05/14/2021 0938   ALBUMIN 3.1 (L) 05/14/2021 0938   AST 14 (L) 05/14/2021 0938   ALT 9 05/14/2021 0938   ALKPHOS 84 05/14/2021 0938   BILITOT <0.2 (L) 05/14/2021 0938   GFRNONAA >60 05/14/2021 0938    No results found for: SPEP, UPEP  Lab Results  Component Value Date   WBC 16.6 (H) 05/14/2021   NEUTROABS 14.7 (H) 05/14/2021   HGB 13.0 05/14/2021   HCT 39.7 05/14/2021   MCV 84.6 05/14/2021   PLT 409 (H) 05/14/2021      Chemistry      Component Value Date/Time   NA 141 05/14/2021 0938   K 4.0 05/14/2021 0938   CL 107 05/14/2021 0938   CO2 21 (L) 05/14/2021 0938   BUN 20 05/14/2021 0938   CREATININE 0.70 05/14/2021 0938   CREATININE 0.72 01/13/2014 1552      Component Value Date/Time   CALCIUM 9.6 05/14/2021 0938   ALKPHOS 84 05/14/2021 0938   AST  14 (L) 05/14/2021 0938   ALT 9 05/14/2021 0938   BILITOT <0.2 (L) 05/14/2021 5631

## 2021-05-14 NOTE — Assessment & Plan Note (Signed)
This is due to steroids Observe for now

## 2021-05-14 NOTE — Telephone Encounter (Signed)
Scheduled appointment per 06/14 sch msg. Patient is aware. 

## 2021-05-14 NOTE — Assessment & Plan Note (Signed)
I did not appreciate any evidence of ascites today Observe closely

## 2021-05-14 NOTE — Assessment & Plan Note (Signed)
She has regular bowel movement now She is aware that risk of constipation with further treatment She is regulating her bowel habits with laxatives

## 2021-05-15 LAB — CA 125: Cancer Antigen (CA) 125: 877 U/mL — ABNORMAL HIGH (ref 0.0–38.1)

## 2021-05-28 ENCOUNTER — Other Ambulatory Visit: Payer: Medicare HMO

## 2021-05-28 ENCOUNTER — Ambulatory Visit: Payer: Medicare HMO

## 2021-05-28 ENCOUNTER — Ambulatory Visit: Payer: Medicare HMO | Admitting: Hematology and Oncology

## 2021-06-04 ENCOUNTER — Inpatient Hospital Stay: Payer: Medicare HMO | Attending: Hematology and Oncology | Admitting: Hematology and Oncology

## 2021-06-04 ENCOUNTER — Other Ambulatory Visit: Payer: Self-pay

## 2021-06-04 ENCOUNTER — Encounter: Payer: Self-pay | Admitting: Hematology and Oncology

## 2021-06-04 ENCOUNTER — Inpatient Hospital Stay: Payer: Medicare HMO

## 2021-06-04 DIAGNOSIS — L709 Acne, unspecified: Secondary | ICD-10-CM | POA: Diagnosis not present

## 2021-06-04 DIAGNOSIS — R42 Dizziness and giddiness: Secondary | ICD-10-CM | POA: Diagnosis not present

## 2021-06-04 DIAGNOSIS — C563 Malignant neoplasm of bilateral ovaries: Secondary | ICD-10-CM | POA: Diagnosis not present

## 2021-06-04 DIAGNOSIS — R350 Frequency of micturition: Secondary | ICD-10-CM | POA: Diagnosis not present

## 2021-06-04 DIAGNOSIS — R180 Malignant ascites: Secondary | ICD-10-CM | POA: Insufficient documentation

## 2021-06-04 DIAGNOSIS — Z5111 Encounter for antineoplastic chemotherapy: Secondary | ICD-10-CM | POA: Insufficient documentation

## 2021-06-04 DIAGNOSIS — M2550 Pain in unspecified joint: Secondary | ICD-10-CM | POA: Diagnosis not present

## 2021-06-04 DIAGNOSIS — R630 Anorexia: Secondary | ICD-10-CM | POA: Diagnosis not present

## 2021-06-04 DIAGNOSIS — C786 Secondary malignant neoplasm of retroperitoneum and peritoneum: Secondary | ICD-10-CM | POA: Diagnosis not present

## 2021-06-04 DIAGNOSIS — Z79899 Other long term (current) drug therapy: Secondary | ICD-10-CM | POA: Insufficient documentation

## 2021-06-04 DIAGNOSIS — J90 Pleural effusion, not elsewhere classified: Secondary | ICD-10-CM | POA: Diagnosis not present

## 2021-06-04 DIAGNOSIS — D72825 Bandemia: Secondary | ICD-10-CM

## 2021-06-04 DIAGNOSIS — D72829 Elevated white blood cell count, unspecified: Secondary | ICD-10-CM | POA: Insufficient documentation

## 2021-06-04 DIAGNOSIS — R112 Nausea with vomiting, unspecified: Secondary | ICD-10-CM | POA: Diagnosis not present

## 2021-06-04 DIAGNOSIS — M810 Age-related osteoporosis without current pathological fracture: Secondary | ICD-10-CM | POA: Insufficient documentation

## 2021-06-04 DIAGNOSIS — R519 Headache, unspecified: Secondary | ICD-10-CM | POA: Diagnosis not present

## 2021-06-04 DIAGNOSIS — Z87891 Personal history of nicotine dependence: Secondary | ICD-10-CM | POA: Insufficient documentation

## 2021-06-04 DIAGNOSIS — R32 Unspecified urinary incontinence: Secondary | ICD-10-CM | POA: Diagnosis not present

## 2021-06-04 LAB — CBC WITH DIFFERENTIAL (CANCER CENTER ONLY)
Abs Immature Granulocytes: 0.06 10*3/uL (ref 0.00–0.07)
Basophils Absolute: 0 10*3/uL (ref 0.0–0.1)
Basophils Relative: 0 %
Eosinophils Absolute: 0 10*3/uL (ref 0.0–0.5)
Eosinophils Relative: 0 %
HCT: 39.4 % (ref 36.0–46.0)
Hemoglobin: 12.9 g/dL (ref 12.0–15.0)
Immature Granulocytes: 1 %
Lymphocytes Relative: 23 %
Lymphs Abs: 2.4 10*3/uL (ref 0.7–4.0)
MCH: 28.1 pg (ref 26.0–34.0)
MCHC: 32.7 g/dL (ref 30.0–36.0)
MCV: 85.8 fL (ref 80.0–100.0)
Monocytes Absolute: 0.8 10*3/uL (ref 0.1–1.0)
Monocytes Relative: 7 %
Neutro Abs: 7.4 10*3/uL (ref 1.7–7.7)
Neutrophils Relative %: 69 %
Platelet Count: 405 10*3/uL — ABNORMAL HIGH (ref 150–400)
RBC: 4.59 MIL/uL (ref 3.87–5.11)
RDW: 15.6 % — ABNORMAL HIGH (ref 11.5–15.5)
WBC Count: 10.8 10*3/uL — ABNORMAL HIGH (ref 4.0–10.5)
nRBC: 0 % (ref 0.0–0.2)

## 2021-06-04 LAB — CMP (CANCER CENTER ONLY)
ALT: 9 U/L (ref 0–44)
AST: 16 U/L (ref 15–41)
Albumin: 3 g/dL — ABNORMAL LOW (ref 3.5–5.0)
Alkaline Phosphatase: 83 U/L (ref 38–126)
Anion gap: 8 (ref 5–15)
BUN: 18 mg/dL (ref 8–23)
CO2: 25 mmol/L (ref 22–32)
Calcium: 9.5 mg/dL (ref 8.9–10.3)
Chloride: 107 mmol/L (ref 98–111)
Creatinine: 0.71 mg/dL (ref 0.44–1.00)
GFR, Estimated: >60 mL/min (ref >60)
Glucose, Bld: 91 mg/dL (ref 70–99)
Potassium: 4.2 mmol/L (ref 3.5–5.1)
Sodium: 140 mmol/L (ref 135–145)
Total Bilirubin: <0.2 mg/dL — ABNORMAL LOW (ref 0.3–1.2)
Total Protein: 7 g/dL (ref 6.5–8.1)

## 2021-06-04 NOTE — Assessment & Plan Note (Signed)
The skin changes on her face is likely due to side effects of steroid Monitor closely

## 2021-06-04 NOTE — Assessment & Plan Note (Signed)
This is due to recent treatment Observe for now

## 2021-06-04 NOTE — Progress Notes (Signed)
[place] OFFICE PROGRESS NOTE  Patient Care Team: Berkley Harvey, NP as PCP - General (Nurse Practitioner) Jacqulyn Liner, RN as Oncology Nurse Navigator (Oncology)  ASSESSMENT & PLAN:  Ovarian cancer [facility]) Overall, her abdomen is soft I do not feel that she has persistent ascites She had some side effects from treatment but not unexpected We will proceed with treatment as scheduled I have given her instruction to schedule CT imaging to be done on July 20; this is currently scheduled She will see Dr. Berline Lopes on July 22 for objective assessment of response to treatment and to see if interval debulking is appropriate I will tentatively schedule chemotherapy at the end of July, should she require further treatment  Leukocytosis This is due to recent treatment Observe for now  Acne The skin changes on her face is likely due to side effects of steroid Monitor closely  No orders of the defined types were placed in this encounter.   All questions were answered. The patient knows to call the clinic with any problems, questions or concerns. The total time spent in the appointment was 20 minutes encounter with patients including review of chart and various tests results, discussions about plan of care and coordination of care plan   Heath Lark, MD 06/04/2021 10:32 AM  INTERVAL HISTORY: Please see below for problem oriented charting. She returns for treatment follow-up She denies significant abdominal distention, nausea or changes with bowel habits while on treatment No peripheral neuropathy She noticed some skin rash on her face No recent infection, fever or chills Overall, she tolerated treatment very well  SUMMARY OF ONCOLOGIC HISTORY: Oncology History  Ovarian cancer ([place])  04/01/2021 Initial Diagnosis   She notes that she noticed an umbilical hernia.  She was scheduled to see a surgeon about possible repair.  She also endorses intermittent pain in around the area  of her right hip that she thought was secondary to her exercise routine.  Over the last week, she has had a decreased appetite.  She denies any nausea or emesis but endorses several months of abdominal bloating and early satiety.  She notes normal bowel function, which she describes as bowel movements at least daily.  She has had some increased urinary frequency and around [place] developed stress urinary incontinence when she runs.     04/04/2021 Imaging   US pelvis  Septate uterus, otherwise unremarkable.   Normal endometrial complex.   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   Recommend further assessment by CT imaging with IV and oral contrast.   04/05/2021 Tumor Marker   Patient's tumor was tested for the following markers: CA-125 Results of the tumor marker test revealed 826.   04/05/2021 Imaging   1. Large bilateral solid-appearing adnexal masses are identified concerning for ovarian neoplasm. There is soft tissue infiltration into the surrounding peritoneal cavity which partially encases nonobstructed loops of small bowel with probable serosal involvement. 2. Moderate volume of ascites identified within the abdomen and pelvis likely secondary to peritoneal carcinomatosis. Diagnostic paracentesis may be helpful for further workup. 3. Mild increased caliber of small bowel loops with a few air-fluid levels. However, there are no signs to suggest a bowel obstruction as enteric contrast material is noted up to the level of the hepatic flexure.   04/10/2021 Initial Diagnosis   Ovarian cancer ([place])   04/10/2021 Procedure   Successful ultrasound-guided paracentesis yielding 1.4 liters  of peritoneal fluid.   04/11/2021 Cancer Staging   Staging form: Ovary, Fallopian Tube, and Primary Peritoneal Carcinoma, AJCC 8th Edition - Clinical stage from 04/11/2021: Stage IIIC (cT3c, cN0, cM0) - Signed  by Heath Lark, MD on 04/11/2021  Stage prefix: Initial diagnosis    04/18/2021 Tumor Marker   Patient's tumor was tested for the following markers: CA-125. Results of the tumor marker test revealed 850   04/22/2021 -  Chemotherapy    Patient is on Treatment Plan: OVARIAN CARBOPLATIN (AUC 6) / PACLITAXEL (175) Q21D X 6 CYCLES       05/14/2021 Tumor Marker   Patient's tumor was tested for the following markers: CA-125. Results of the tumor marker test revealed 877.     REVIEW OF SYSTEMS:   Constitutional: Denies fevers, chills or abnormal weight loss Eyes: Denies blurriness of vision Ears, nose, mouth, throat, and face: Denies mucositis or sore throat Respiratory: Denies cough, dyspnea or wheezes Cardiovascular: Denies palpitation, chest discomfort or lower extremity swelling Gastrointestinal:  Denies nausea, heartburn or change in bowel habits Skin: Denies abnormal skin rashes Lymphatics: Denies new lymphadenopathy or easy bruising Neurological:Denies numbness, tingling or new weaknesses Behavioral/Psych: Mood is stable, no new changes  All other systems were reviewed with the patient and are negative.  I have reviewed the past medical history, past surgical history, social history and family history with the patient and they are unchanged from previous note.  ALLERGIES:  is allergic to bee venom, latex, mushroom extract complex, penicillins, and tape.  MEDICATIONS:  Current Outpatient Medications  Medication Sig Dispense Refill   acetaminophen (TYLENOL) 325 MG tablet Take 650 mg by mouth every 6 (six) hours as needed for mild pain.      Calcium-Magnesium-Vitamin D (CALCIUM 500 PO) Take 500 mg by mouth daily.      dexamethasone (DECADRON) 4 MG tablet Take 2 tabs at the night before and 2 tab the morning of chemotherapy, every 3 weeks, by mouth x 6 cycles 36 tablet 6   EPINEPHrine 0.3 mg/0.3 mL IJ SOAJ injection Inject 0.3 mg as directed as needed (allergic reaction).       ibuprofen (ADVIL) 200 MG tablet Take 400 mg by mouth every 6 (six) hours as needed for mild pain or moderate pain.     Misc Natural Products (OSTEO BI-FLEX JOINT SHIELD PO) Take 1 tablet by mouth daily.      Multiple Vitamins-Minerals (MULTIVITAMIN PO) Take 1 tablet by mouth daily.      ondansetron (ZOFRAN) 8 MG tablet Take 1 tablet (8 mg total) by mouth 2 (two) times daily as needed for refractory nausea / vomiting. Start on day 3 after carboplatin chemo. 30 tablet 1   prochlorperazine (COMPAZINE) 10 MG tablet Take 1 tablet (10 mg total) by mouth every 6 (six) hours as needed (Nausea or vomiting). 30 tablet 1   SUMAtriptan (IMITREX) 100 MG tablet 1 tab with headache onset.  Can repeat in 2 hours.  Max dosage 200mg /24 hours. (Patient not taking: Reported on 04/09/2021) 27 tablet 4   No current facility-administered medications for this visit.    PHYSICAL EXAMINATION: ECOG PERFORMANCE STATUS: 1 - Symptomatic but completely ambulatory  Vitals:   06/04/21 1019  BP: 131/82  Pulse: 86  Resp: 18  Temp: 97.8 F (36.6 C)  SpO2: 100%   Filed Weights   06/04/21 1019  Weight: 130 lb (59 kg)    GENERAL:alert, no distress and comfortable SKIN: skin color, texture, turgor are normal, no rashes or significant  lesions EYES: normal, Conjunctiva are pink and non-injected, sclera clear OROPHARYNX:no exudate, no erythema and lips, buccal mucosa, and tongue normal  NECK: supple, thyroid normal size, non-tender, without nodularity LYMPH:  no palpable lymphadenopathy in the cervical, axillary or inguinal LUNGS: clear to auscultation and percussion with normal breathing effort HEART: regular rate & rhythm and no murmurs and no lower extremity edema ABDOMEN:abdomen soft, no palpable ascites.  Her umbilicus is a bit protruded Musculoskeletal:no cyanosis of digits and no clubbing  NEURO: alert & oriented x 3 with fluent speech, no focal motor/sensory deficits  LABORATORY DATA:  I have reviewed the data  as listed    Component Value Date/Time   NA 141 05/14/2021 0938   K 4.0 05/14/2021 0938   CL 107 05/14/2021 0938   CO2 21 (L) 05/14/2021 0938   GLUCOSE 144 (H) 05/14/2021 0938   BUN 20 05/14/2021 0938   CREATININE 0.70 05/14/2021 0938   CREATININE 0.72 01/13/2014 1552   CALCIUM 9.6 05/14/2021 0938   PROT 7.3 05/14/2021 0938   ALBUMIN 3.1 (L) 05/14/2021 0938   AST 14 (L) 05/14/2021 0938   ALT 9 05/14/2021 0938   ALKPHOS 84 05/14/2021 0938   BILITOT <0.2 (L) 05/14/2021 0938   GFRNONAA >60 05/14/2021 0938    No results found for: SPEP, UPEP  Lab Results  Component Value Date   WBC 10.8 (H) 06/04/2021   NEUTROABS 7.4 06/04/2021   HGB 12.9 06/04/2021   HCT 39.4 06/04/2021   MCV 85.8 06/04/2021   PLT 405 (H) 06/04/2021      Chemistry      Component Value Date/Time   NA 141 05/14/2021 0938   K 4.0 05/14/2021 0938   CL 107 05/14/2021 0938   CO2 21 (L) 05/14/2021 0938   BUN 20 05/14/2021 0938   CREATININE 0.70 05/14/2021 0938   CREATININE 0.72 01/13/2014 1552      Component Value Date/Time   CALCIUM 9.6 05/14/2021 0938   ALKPHOS 84 05/14/2021 0938   AST 14 (L) 05/14/2021 0938   ALT 9 05/14/2021 0938   BILITOT <0.2 (L) 05/14/2021 6301

## 2021-06-04 NOTE — Assessment & Plan Note (Signed)
Overall, her abdomen is soft I do not feel that she has persistent ascites She had some side effects from treatment but not unexpected We will proceed with treatment as scheduled I have given her instruction to schedule CT imaging to be done on July 20; this is currently scheduled She will see Dr. Berline Lopes on July 22 for objective assessment of response to treatment and to see if interval debulking is appropriate I will tentatively schedule chemotherapy at the end of July, should she require further treatment

## 2021-06-05 ENCOUNTER — Inpatient Hospital Stay: Payer: Medicare HMO

## 2021-06-05 ENCOUNTER — Encounter: Payer: Self-pay | Admitting: Oncology

## 2021-06-05 VITALS — BP 141/86 | HR 76 | Temp 97.6°F | Resp 18

## 2021-06-05 DIAGNOSIS — C563 Malignant neoplasm of bilateral ovaries: Secondary | ICD-10-CM

## 2021-06-05 DIAGNOSIS — R180 Malignant ascites: Secondary | ICD-10-CM

## 2021-06-05 DIAGNOSIS — Z5111 Encounter for antineoplastic chemotherapy: Secondary | ICD-10-CM | POA: Diagnosis not present

## 2021-06-05 LAB — CA 125: Cancer Antigen (CA) 125: 963 U/mL — ABNORMAL HIGH (ref 0.0–38.1)

## 2021-06-05 MED ORDER — DIPHENHYDRAMINE HCL 50 MG/ML IJ SOLN
INTRAMUSCULAR | Status: AC
  Filled 2021-06-05: qty 1

## 2021-06-05 MED ORDER — PALONOSETRON HCL INJECTION 0.25 MG/5ML
0.2500 mg | Freq: Once | INTRAVENOUS | Status: AC
Start: 2021-06-05 — End: 2021-06-05
  Administered 2021-06-05: 0.25 mg via INTRAVENOUS

## 2021-06-05 MED ORDER — DIPHENHYDRAMINE HCL 50 MG/ML IJ SOLN
25.0000 mg | Freq: Once | INTRAMUSCULAR | Status: AC
Start: 2021-06-05 — End: 2021-06-05
  Administered 2021-06-05: 25 mg via INTRAVENOUS

## 2021-06-05 MED ORDER — PALONOSETRON HCL INJECTION 0.25 MG/5ML
INTRAVENOUS | Status: AC
  Filled 2021-06-05: qty 5

## 2021-06-05 MED ORDER — SODIUM CHLORIDE 0.9 % IV SOLN
150.0000 mg | Freq: Once | INTRAVENOUS | Status: AC
  Administered 2021-06-05: 150 mg via INTRAVENOUS
  Filled 2021-06-05: qty 150

## 2021-06-05 MED ORDER — SODIUM CHLORIDE 0.9 % IV SOLN
10.0000 mg | Freq: Once | INTRAVENOUS | Status: AC
  Administered 2021-06-05: 10 mg via INTRAVENOUS
  Filled 2021-06-05: qty 10

## 2021-06-05 MED ORDER — SODIUM CHLORIDE 0.9 % IV SOLN
451.2000 mg | Freq: Once | INTRAVENOUS | Status: AC
  Administered 2021-06-05: 450 mg via INTRAVENOUS
  Filled 2021-06-05: qty 45

## 2021-06-05 MED ORDER — SODIUM CHLORIDE 0.9 % IV SOLN
Freq: Once | INTRAVENOUS | Status: AC
Start: 2021-06-05 — End: 2021-06-05
  Filled 2021-06-05: qty 250

## 2021-06-05 MED ORDER — FAMOTIDINE 20 MG IN NS 100 ML IVPB
20.0000 mg | Freq: Once | INTRAVENOUS | Status: AC
  Administered 2021-06-05: 20 mg via INTRAVENOUS

## 2021-06-05 MED ORDER — FAMOTIDINE 20 MG IN NS 100 ML IVPB
INTRAVENOUS | Status: AC
  Filled 2021-06-05: qty 100

## 2021-06-05 MED ORDER — SODIUM CHLORIDE 0.9 % IV SOLN
175.0000 mg/m2 | Freq: Once | INTRAVENOUS | Status: AC
  Administered 2021-06-05: 282 mg via INTRAVENOUS
  Filled 2021-06-05: qty 47

## 2021-06-05 NOTE — Progress Notes (Signed)
Notified Denice Paradise of CA 125 results and new CT scan appointment on 06/10/21 and follow up with Dr. Berline Lopes on 06/11/21.  She verbalized understanding and agreement.

## 2021-06-05 NOTE — Patient Instructions (Signed)
[facility]  Discharge Instructions: °Thank you for choosing [facility] to provide your oncology and hematology care.  ° °If you have a lab appointment with the Cancer Center, please go directly to the Cancer Center and check in at the registration area. °  °Wear comfortable clothing and clothing appropriate for easy access to any Portacath or PICC line.  ° °We strive to give you quality time with your provider. You may need to reschedule your appointment if you arrive late (15 or more minutes).  Arriving late affects you and other patients whose appointments are after yours.  Also, if you miss three or more appointments without notifying the office, you may be dismissed from the clinic at the provider’s discretion.    °  °For prescription refill requests, have your pharmacy contact our office and allow 72 hours for refills to be completed.   ° °Today you received the following chemotherapy and/or immunotherapy agents paclitaxel, carboplatin    °  °To help prevent nausea and vomiting after your treatment, we encourage you to take your nausea medication as directed. ° °BELOW ARE SYMPTOMS THAT SHOULD BE REPORTED IMMEDIATELY: °*FEVER GREATER THAN 100.4 F (38 °C) OR HIGHER °*CHILLS OR SWEATING °*NAUSEA AND VOMITING THAT IS NOT CONTROLLED WITH YOUR NAUSEA MEDICATION °*UNUSUAL SHORTNESS OF BREATH °*UNUSUAL BRUISING OR BLEEDING °*URINARY PROBLEMS (pain or burning when urinating, or frequent urination) °*BOWEL PROBLEMS (unusual diarrhea, constipation, pain near the anus) °TENDERNESS IN MOUTH AND THROAT WITH OR WITHOUT PRESENCE OF ULCERS (sore throat, sores in mouth, or a toothache) °UNUSUAL RASH, SWELLING OR PAIN  °UNUSUAL VAGINAL DISCHARGE OR ITCHING  ° °Items with * indicate a potential emergency and should be followed up as soon as possible or go to the Emergency Department if any problems should occur. ° °Please show the CHEMOTHERAPY ALERT CARD or IMMUNOTHERAPY ALERT CARD at  check-in to the Emergency Department and triage nurse. ° °Should you have questions after your visit or need to cancel or reschedule your appointment, please contact [facility]  Dept: [phone]  and follow the prompts.  Office hours are 8:00 a.m. to 4:30 p.m. Monday - Friday. Please note that voicemails left after 4:00 p.m. may not be returned until the following business day.  We are closed weekends and major holidays. You have access to a nurse at all times for urgent questions. Please call the main number to the clinic Dept: [phone] and follow the prompts. ° ° °For any non-urgent questions, you may also contact your provider using MyChart. We now offer e-Visits for anyone 18 and older to request care online for non-urgent symptoms. For details visit [email or web address]. °  °Also download the MyChart app! Go to the app store, search "MyChart", open the app, select [facility], and log in with your MyChart username and password. ° °Due to Covid, a mask is required upon entering the hospital/clinic. If you do not have a mask, one will be given to you upon arrival. For doctor visits, patients may have 1 support person aged 18 or older with them. For treatment visits, patients cannot have anyone with them due to current Covid guidelines and our immunocompromised population.  ° °

## 2021-06-06 ENCOUNTER — Other Ambulatory Visit: Payer: Self-pay | Admitting: Hematology and Oncology

## 2021-06-06 DIAGNOSIS — C563 Malignant neoplasm of bilateral ovaries: Secondary | ICD-10-CM

## 2021-06-07 ENCOUNTER — Telehealth: Payer: Self-pay | Admitting: Oncology

## 2021-06-07 NOTE — Telephone Encounter (Signed)
Karen Winters called and said her CT scan for Monday has been canceled due to insurance not authorizing it.  Called her back and advised the CT has been changed to abd/pelvis (chest was not authorized) and that it has been rescheduled to 8:00 on 06/10/21.  She verbalized understanding and agreement.

## 2021-06-10 ENCOUNTER — Encounter: Payer: Self-pay | Admitting: Gynecologic Oncology

## 2021-06-10 ENCOUNTER — Encounter: Payer: Self-pay | Admitting: Hematology and Oncology

## 2021-06-10 ENCOUNTER — Other Ambulatory Visit: Payer: Self-pay

## 2021-06-10 ENCOUNTER — Ambulatory Visit (HOSPITAL_BASED_OUTPATIENT_CLINIC_OR_DEPARTMENT_OTHER)
Admission: RE | Admit: 2021-06-10 | Discharge: 2021-06-10 | Disposition: A | Discharge status: Home / Self Care | Payer: Medicare HMO | Source: Ambulatory Visit | Attending: Hematology and Oncology | Admitting: Hematology and Oncology

## 2021-06-10 ENCOUNTER — Telehealth: Payer: Self-pay | Admitting: *Deleted

## 2021-06-10 ENCOUNTER — Ambulatory Visit (HOSPITAL_BASED_OUTPATIENT_CLINIC_OR_DEPARTMENT_OTHER): Payer: Medicare HMO

## 2021-06-10 DIAGNOSIS — C563 Malignant neoplasm of bilateral ovaries: Secondary | ICD-10-CM | POA: Insufficient documentation

## 2021-06-10 MED ORDER — IOHEXOL 300 MG/ML  SOLN
100.0000 mL | Freq: Once | INTRAMUSCULAR | Status: AC | PRN
  Administered 2021-06-10: 75 mL via INTRAVENOUS

## 2021-06-10 NOTE — Telephone Encounter (Signed)
Called and left the patient a message to call the office back. Need to move the appt tomorrow to an earlier time

## 2021-06-11 ENCOUNTER — Telehealth: Payer: Self-pay | Admitting: Oncology

## 2021-06-11 ENCOUNTER — Other Ambulatory Visit: Payer: Self-pay | Admitting: Gynecologic Oncology

## 2021-06-11 ENCOUNTER — Encounter: Payer: Self-pay | Admitting: Gynecologic Oncology

## 2021-06-11 ENCOUNTER — Inpatient Hospital Stay: Payer: Medicare HMO | Admitting: Gynecologic Oncology

## 2021-06-11 ENCOUNTER — Encounter: Payer: Self-pay | Admitting: Hematology and Oncology

## 2021-06-11 VITALS — BP 132/95 | HR 97 | Temp 97.4°F | Resp 18 | Ht 64.0 in | Wt 120.2 lb

## 2021-06-11 DIAGNOSIS — C569 Malignant neoplasm of unspecified ovary: Secondary | ICD-10-CM

## 2021-06-11 DIAGNOSIS — C563 Malignant neoplasm of bilateral ovaries: Secondary | ICD-10-CM | POA: Diagnosis not present

## 2021-06-11 DIAGNOSIS — Z5111 Encounter for antineoplastic chemotherapy: Secondary | ICD-10-CM | POA: Diagnosis not present

## 2021-06-11 MED ORDER — SENNOSIDES-DOCUSATE SODIUM 8.6-50 MG PO TABS: 2.0000 | ORAL_TABLET | Freq: Every day | ORAL | 0 refills | Status: AC

## 2021-06-11 MED ORDER — ENOXAPARIN SODIUM 40 MG/0.4ML IJ SOSY: 40.0000 mg | PREFILLED_SYRINGE | INTRAMUSCULAR | 0 refills | Status: DC

## 2021-06-11 MED ORDER — IBUPROFEN 600 MG PO TABS: 600.0000 mg | ORAL_TABLET | Freq: Four times a day (QID) | ORAL | 0 refills | Status: AC | PRN

## 2021-06-11 MED ORDER — TRAMADOL HCL 50 MG PO TABS: 50.0000 mg | ORAL_TABLET | Freq: Four times a day (QID) | ORAL | 0 refills | Status: DC | PRN

## 2021-06-11 NOTE — H&P (View-Only) (Signed)
Gynecologic Oncology Return Clinic Visit  06/11/21  Reason for Visit: follow-up after 3 cycles of NACT  Treatment History: Oncology History  Ovarian cancer ([place])  04/01/2021 Initial Diagnosis   She notes that she noticed an umbilical hernia.  She was scheduled to see a surgeon about possible repair.  She also endorses intermittent pain in around the area of her right hip that she thought was secondary to her exercise routine.  Over the last week, she has had a decreased appetite.  She denies any nausea or emesis but endorses several months of abdominal bloating and early satiety.  She notes normal bowel function, which she describes as bowel movements at least daily.  She has had some increased urinary frequency and around [place] developed stress urinary incontinence when she runs.     04/04/2021 Imaging   US pelvis  Septate uterus, otherwise unremarkable.   Normal endometrial complex.   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   Recommend further assessment by CT imaging with IV and oral contrast.   04/05/2021 Tumor Marker   Patient's tumor was tested for the following markers: CA-125 Results of the tumor marker test revealed 826.   04/05/2021 Imaging   1. Large bilateral solid-appearing adnexal masses are identified concerning for ovarian neoplasm. There is soft tissue infiltration into the surrounding peritoneal cavity which partially encases nonobstructed loops of small bowel with probable serosal involvement. 2. Moderate volume of ascites identified within the abdomen and pelvis likely secondary to peritoneal carcinomatosis. Diagnostic paracentesis may be helpful for further workup. 3. Mild increased caliber of small bowel loops with a few air-fluid levels. However, there are no signs to suggest a bowel obstruction as enteric contrast material is noted up to the level of the hepatic  flexure.   04/10/2021 Initial Diagnosis   Ovarian cancer ([place])   04/10/2021 Procedure   Successful ultrasound-guided paracentesis yielding 1.4 liters of peritoneal fluid.   04/11/2021 Cancer Staging   Staging form: Ovary, Fallopian Tube, and Primary Peritoneal Carcinoma, AJCC 8th Edition - Clinical stage from 04/11/2021: Stage IIIC (cT3c, cN0, cM0) - Signed by Heath Lark, MD on 04/11/2021  Stage prefix: Initial diagnosis    04/18/2021 Tumor Marker   Patient's tumor was tested for the following markers: CA-125. Results of the tumor marker test revealed 321   04/22/2021 -  Chemotherapy    Patient is on Treatment Plan: OVARIAN CARBOPLATIN (AUC 6) / PACLITAXEL (175) Q21D X 6 CYCLES       05/14/2021 Tumor Marker   Patient's tumor was tested for the following markers: CA-125. Results of the tumor marker test revealed 877.   06/10/2021 Imaging   1. Today's study demonstrates a centrally stable disease when compared to prior examination from 04/05/2021, with large bilateral ovarian lesions and moderate volume of malignant ascites, as detailed above. 2. Trace right pleural effusion lying dependently.       Interval History: The patient presents today for interval follow-up.  She received cycle 3 of neoadjuvant carboplatin and paclitaxel on Wednesday the sixth.  She was doing very well until this last cycle of chemotherapy.  She notes that things are improving significantly since Sunday but she continues to have decreased appetite, nausea, emesis (has had 3 episodes of emesis in the last week), and changes to her taste.  She has continued to have bowel movements although has noted some constipation with only small bowel movements the last several days.  She is restarted Senokot and is going to start MiraLAX again this evening.  Prior to the last week, she denies any significant abdominal pain.  She reports good appetite without nausea and emesis.  She has been walking 3 to 4 miles a day.  Past  Medical/Surgical History: Past Medical History:  Diagnosis Date   Endometriosis    Family history of renal cancer 05/09/2021   History of anemia    Migraines    Osteoporosis     Past Surgical History:  Procedure Laterality Date   CESAREAN SECTION  1986, 1990   and BTSP   COLONOSCOPY     LAPAROSCOPY     for infertility-endometriosis   LAPAROSCOPY     2nd for infertility   mole removed     precancerous mole right upper abd   ORIF WRIST FRACTURE Right 08/02/2020   Procedure: Right distal radius open reduction internal fixation;  Surgeon: Verner Mould, MD;  Location: [facility];  Service: Orthopedics;  Laterality: Right;  35min   TONSILLECTOMY AND ADENOIDECTOMY      Family History  Problem Relation Age of Onset   Renal cancer Father 68       Agent Orange exposure   Hypertension Sister    Hypothyroidism Sister    Other Sister        Wegener's syndrome   Hypertension Brother    Diabetes Maternal Grandmother    Heart attack Paternal Grandfather     Social History   Socioeconomic History   Marital status: Married    Spouse name: Phil   Number of children: 2   Years of education: Not on file   Highest education level: Not on file  Occupational History   Occupation: retired  Tobacco Use   Smoking status: Former    Pack years: 0.00    Types: Cigarettes    Quit date: 12/02/1983    Years since quitting: 37.5   Smokeless tobacco: Never   Tobacco comments:    quit 1985  Vaping Use   Vaping Use: Never used  Substance and Sexual Activity   Alcohol use: Yes    Alcohol/week: 0.0 standard drinks    Comment: ocassional   Drug use: No   Sexual activity: Yes    Partners: Male    Birth control/protection: Surgical, Post-menopausal    Comment: BTL  Other Topics Concern   Not on file  Social History Narrative   Not on file   Social Determinants of Health   Financial Resource Strain: Not on file  Food Insecurity: Not on file  Transportation Needs: Not on file   Physical Activity: Not on file  Stress: Not on file  Social Connections: Not on file    Current Medications:  Current Outpatient Medications:    ibuprofen (ADVIL) 600 MG tablet, Take 1 tablet (600 mg total) by mouth every 6 (six) hours as needed for moderate pain. For AFTER surgery, Disp: 30 tablet, Rfl: 0   senna-docusate (SENOKOT-S) 8.6-50 MG tablet, Take 2 tablets by mouth at bedtime. For AFTER surgery, do not take if having diarrhea, Disp: 30 tablet, Rfl: 0   traMADol (ULTRAM) 50 MG tablet, Take 1 tablet (50 mg total) by mouth every 6 (six) hours as needed for severe pain. For AFTER surgery only, do not take and drive, Disp: 10 tablet, Rfl: 0   acetaminophen (TYLENOL) 325 MG tablet, Take 650 mg by mouth every 6 (six) hours as needed for mild pain. , Disp: , Rfl:  Calcium-Magnesium-Vitamin D (CALCIUM 500 PO), Take 500 mg by mouth daily. , Disp: , Rfl:    dexamethasone (DECADRON) 4 MG tablet, Take 2 tabs at the night before and 2 tab the morning of chemotherapy, every 3 weeks, by mouth x 6 cycles, Disp: 36 tablet, Rfl: 6   EPINEPHrine 0.3 mg/0.3 mL IJ SOAJ injection, Inject 0.3 mg as directed as needed (allergic reaction). , Disp: , Rfl:    ibuprofen (ADVIL) 200 MG tablet, Take 400 mg by mouth every 6 (six) hours as needed for mild pain or moderate pain., Disp: , Rfl:    Misc Natural Products (OSTEO BI-FLEX JOINT SHIELD PO), Take 1 tablet by mouth daily. , Disp: , Rfl:    Multiple Vitamins-Minerals (MULTIVITAMIN PO), Take 1 tablet by mouth daily. , Disp: , Rfl:    ondansetron (ZOFRAN) 8 MG tablet, Take 1 tablet (8 mg total) by mouth 2 (two) times daily as needed for refractory nausea / vomiting. Start on day 3 after carboplatin chemo., Disp: 30 tablet, Rfl: 1   prochlorperazine (COMPAZINE) 10 MG tablet, Take 1 tablet (10 mg total) by mouth every 6 (six) hours as needed (Nausea or vomiting)., Disp: 30 tablet, Rfl: 1   SUMAtriptan (IMITREX) 100 MG tablet, 1 tab with headache onset.  Can  repeat in 2 hours.  Max dosage 200mg /24 hours. (Patient not taking: Reported on 04/09/2021), Disp: 27 tablet, Rfl: 4  Review of Systems: Pertinent positives include decreased appetite, urinary frequency, incontinence, hot flashes, joint pain, dizziness/lightheadedness, headache, nausea. Denies fevers, chills, fatigue, unexplained weight changes. Denies hearing loss, neck lumps or masses, mouth sores, ringing in ears or voice changes. Denies cough or wheezing.  Denies shortness of breath. Denies chest pain or palpitations. Denies leg swelling. Denies abdominal distention, pain, blood in stools, constipation, diarrhea, vomiting, or early satiety. Denies pain with intercourse, dysuria, hematuria. Denies pelvic pain, vaginal bleeding or vaginal discharge.   Denies back pain or muscle pain/cramps. Denies itching, rash, or wounds. Denies numbness or seizures. Denies swollen lymph nodes or glands, denies easy bruising or bleeding. Denies anxiety, depression, confusion, or decreased concentration.  Physical Exam: BP (!) 132/95 (BP Location: Right Arm, Patient Position: Sitting)   Pulse 97   Temp (!) 97.4 F (36.3 C) (Tympanic)   Resp 18   Ht 5\' 4"  (1.626 m)   Wt 120 lb 3.2 oz (54.5 kg)   LMP 10/31/2008   SpO2 100%   BMI 20.63 kg/m  General: Alert, oriented, no acute distress. HEENT: Posterior oropharynx clear, sclera anicteric. Chest: Clear to auscultation bilaterally.  No wheezes or rhonchi. Cardiovascular: Regular rate and rhythm, no murmurs. Extremities: Grossly normal range of motion.  Warm, well perfused.  No edema bilaterally. Skin: No rashes or lesions noted. Lymphatics: No cervical, supraclavicular adenopathy.  Laboratory & Radiologic Studies: CBC    Component Value Date/Time   WBC 10.8 (H) 06/04/2021 0958   WBC 7.1 08/01/2020 0839   RBC 4.59 06/04/2021 0958   HGB 12.9 06/04/2021 0958   HCT 39.4 06/04/2021 0958   PLT 405 (H) 06/04/2021 0958   MCV 85.8 06/04/2021 0958    MCH 28.1 06/04/2021 0958   MCHC 32.7 06/04/2021 0958   RDW 15.6 (H) 06/04/2021 0958   LYMPHSABS 2.4 06/04/2021 0958   MONOABS 0.8 06/04/2021 0958   EOSABS 0.0 06/04/2021 0958   BASOSABS 0.0 06/04/2021 0958   CMP Latest Ref Rng & Units 06/04/2021 05/14/2021 04/18/2021  Glucose 70 - 99 mg/dL 91 144(H) 101(H)  BUN 8 - 23  mg/dL 18 20 21   Creatinine 0.44 - 1.00 mg/dL 0.71 0.70 0.74  Sodium 135 - 145 mmol/L 140 141 140  Potassium 3.5 - 5.1 mmol/L 4.2 4.0 3.8  Chloride 98 - 111 mmol/L 107 107 106  CO2 22 - 32 mmol/L 25 21(L) 26  Calcium 8.9 - 10.3 mg/dL 9.5 9.6 9.1  Total Protein 6.5 - 8.1 g/dL 7.0 7.3 6.8  Total Bilirubin 0.3 - 1.2 mg/dL <0.2(L) <0.2(L) 0.3  Alkaline Phos 38 - 126 U/L 83 84 78  AST 15 - 41 U/L 16 14(L) 17  ALT 0 - 44 U/L 9 9 6    CT A/P on 7/11: IMPRESSION: 1. Today's study demonstrates a centrally stable disease when compared to prior examination from 04/05/2021, with large bilateral ovarian lesions and moderate volume of malignant ascites, as detailed above. 2. Trace right pleural effusion lying dependently.  Assessment & Plan: Karen Winters is a 70 y.o. woman with platinum refractory ovarian cancer who presents for discussion of recent CT findings.  I spent some time speaking with the patient and her husband about recent CT.  Given mildly increasing CA-125 during neoadjuvant chemotherapy, CT scan was obtained yesterday, which shows no significant response to treatment.  She continues to have bilateral adnexal masses as well as imaging findings that raise the concern for carcinomatosis.  I discussed that the failure to respond to platinum therapy qualifies her with platinum refractory disease.  On one hand, there is evidence that this would be a contraindication to surgery.  I am concerned though that there may be something about her tumor histology or biology that is causing her lack of response to platinum.  Her diagnosis was by paracentesis.  If, for instance, she  has low-grade serous disease, this could explain her lack of treatment response.  I am willing to offer an attempt at surgical cytoreduction although we discussed the goals of such as surgery today and very explicit detail.  The first would be obtained tissue for pathology review to assure that we have the correct diagnosis.  The second would be for tissue to send for molecular testing to see if the patient has a actionable mutation.  The third would be to help decrease symptom burden or prolong the period until she developed symptoms.  I discussed the risk that I will not be able to perform optimal cytoreduction as well as the risk that I may not be able to perform surgery.  Given platinum refractory disease, I am not going to perform a significantly morbid surgery (such as one with multiple bowel resections).  In this setting, if surgery is not feasible, then I would obtain tissue for pathology as well as molecular testing.  The patient and her husband voiced understanding of the goals of surgery as well as the possible outcomes.  If surgery is possible, then this will be done for laparotomy.  Given increased risk of DVT in the postoperative setting, plan would be for 4 weeks of prophylactic anticoagulation.  She received her last cycle of chemotherapy on July 6.  Plan will be made tentatively for surgery on August 2.  This is pending laboratory evaluation closer to the time of surgery to assure that blood counts are adequate.  We discussed the plan for a exploratory laparotomy, possible biopsy, possible tumor debulking which may include bilateral salpingo-oophorectomy, total hysterectomy, and any other indicated procedures. The risks of surgery were discussed in detail and she understands these to include infection; wound separation; hernia; vaginal cuff  separation, injury to adjacent organs such as bowel, bladder, blood vessels, ureters and nerves; bleeding which may require blood transfusion; anesthesia  risk; thromboembolic events; possible death; unforeseen complications; possible need for re-exploration; medical complications such as heart attack, stroke, pleural effusion and pneumonia; and, if full lymphadenectomy is performed the risk of lymphedema and lymphocyst. The patient will receive DVT and antibiotic prophylaxis as indicated. She voiced a clear understanding. She had the opportunity to ask questions. Perioperative instructions were reviewed with her.   60 minutes of total time was spent for this patient encounter, including preparation, face-to-face counseling with the patient and coordination of care, and documentation of the encounter.  Jeral Pinch, MD  Division of Gynecologic Oncology  Department of Obstetrics and Gynecology  [facility]

## 2021-06-11 NOTE — Patient Instructions (Addendum)
Preparing for your Surgery  Plan for surgery on July 02, 2021 with Dr. Jeral Pinch at [place] will be scheduled for exploratory laparotomy, total abdominal hysterectomy (removal of the uterus and cervix), bilateral salpingo-oophorectomy (removal of both ovaries and fallopian tubes), omentectomy, tumor debulking.  After surgery, you will be sent home with once daily lovenox injections to prevent blood clots. You will need the injections for a total of 28 days after surgery.  Pre-operative Testing -You will receive a phone call from presurgical testing at [facility] to arrange for a pre-operative appointment and lab work.  -Bring your insurance card, copy of an advanced directive if applicable, medication list  -At that visit, you will be asked to sign a consent for a possible blood transfusion in case a transfusion becomes necessary during surgery.  The need for a blood transfusion is rare but having consent is a necessary part of your care.     -You should not be taking blood thinners or aspirin at least ten days prior to surgery unless instructed by your surgeon.  -Do not take supplements such as fish oil (omega 3), red yeast rice, turmeric before your surgery. You want to avoid medications with aspirin in them including headache powders such as BC or Goody's), Excedrin migraine.  Day Before Surgery at [place] will be asked to take in a light diet the day before surgery. You will be advised you can have clear liquids up until 3 hours before your surgery.    Eat a light diet the day before surgery.  Examples including soups, broths, toast, yogurt, mashed potatoes.  AVOID GAS PRODUCING FOODS. Things to avoid include carbonated beverages (fizzy beverages, sodas), raw fruits and raw vegetables (uncooked), or beans.   If your bowels are filled with gas, your surgeon will have difficulty visualizing your pelvic organs which increases your surgical risks.  Your  role in recovery Your role is to become active as soon as directed by your doctor, while still giving yourself time to heal.  Rest when you feel tired. You will be asked to do the following in order to speed your recovery:  - Cough and breathe deeply. This helps to clear and expand your lungs and can prevent pneumonia after surgery.  - [place]. Do mild physical activity. Walking or moving your legs help your circulation and body functions return to normal. Do not try to get up or walk alone the first time after surgery.   -If you develop swelling on one leg or the other, pain in the back of your leg, redness/warmth in one of your legs, please call the office or go to the Emergency Room to have a doppler to rule out a blood clot. For shortness of breath, chest pain-seek care in the Emergency Room as soon as possible. - Actively manage your pain. Managing your pain lets you move in comfort. We will ask you to rate your pain on a scale of zero to 10. It is your responsibility to tell your doctor or nurse where and how much you hurt so your pain can be treated.  Special Considerations -If you are diabetic, you may be placed on insulin after surgery to have closer control over your blood sugars to promote healing and recovery.  This does not mean that you will be discharged on insulin.  If applicable, your oral antidiabetics will be resumed when you are tolerating a solid diet.  -Your final pathology  results from surgery should be available around one week after surgery and the results will be relayed to you when available.  -Dr. Lahoma Crocker is the surgeon that assists your GYN Oncologist with surgery.  If you end up staying the night, the next day after your surgery you will either see Dr. Denman George, Dr. Berline Lopes, or Dr. Lahoma Crocker.  -FMLA forms can be faxed to [phone] and please allow 5-7 business days for completion.  Pain Management After Surgery -You have been  prescribed your pain medication and bowel regimen medications before surgery so that you can have these available when you are discharged from the hospital. The pain medication is for use ONLY AFTER surgery and a new prescription will not be given.   -Make sure that you have Tylenol and Ibuprofen at home to use on a regular basis after surgery for pain control. We recommend alternating the medications every hour to six hours since they work differently and are processed in the body differently for pain relief.  -Review the attached handout on narcotic use and their risks and side effects.   Bowel Regimen -You have been prescribed Sennakot-S to take nightly to prevent constipation especially if you are taking the narcotic pain medication intermittently.  It is important to prevent constipation and drink adequate amounts of liquids. You can stop taking this medication when you are not taking pain medication and you are back on your normal bowel routine.  Risks of Surgery Risks of surgery are low but include bleeding, infection, damage to surrounding structures, re-operation, blood clots, and very rarely death.   Blood Transfusion Information (For the consent to be signed before surgery)  We will be checking your blood type before surgery so in case of emergencies, we will know what type of blood you would need.                                            WHAT IS A BLOOD TRANSFUSION?  A transfusion is the replacement of blood or some of its parts. Blood is made up of multiple cells which provide different functions. Red blood cells carry oxygen and are used for blood loss replacement. White blood cells fight against infection. Platelets control bleeding. Plasma helps clot blood. Other blood products are available for specialized needs, such as hemophilia or other clotting disorders. BEFORE THE TRANSFUSION  Who gives blood for transfusions?  You may be able to donate blood to be used at a later  date on yourself (autologous donation). Relatives can be asked to donate blood. This is generally not any safer than if you have received blood from a stranger. The same precautions are taken to ensure safety when a relative's blood is donated. Healthy volunteers who are fully evaluated to make sure their blood is safe. This is blood bank blood. Transfusion therapy is the safest it has ever been in the practice of medicine. Before blood is taken from a donor, a complete history is taken to make sure that person has no history of diseases nor engages in risky social behavior (examples are intravenous drug use or sexual activity with multiple partners). The donor's travel history is screened to minimize risk of transmitting infections, such as malaria. The donated blood is tested for signs of infectious diseases, such as HIV and hepatitis. The blood is then tested to be sure it is compatible with  you in order to minimize the chance of a transfusion reaction. If you or a relative donates blood, this is often done in anticipation of surgery and is not appropriate for emergency situations. It takes many days to process the donated blood. RISKS AND COMPLICATIONS Although transfusion therapy is very safe and saves many lives, the main dangers of transfusion include:  Getting an infectious disease. Developing a transfusion reaction. This is an allergic reaction to something in the blood you were given. Every precaution is taken to prevent this. The decision to have a blood transfusion has been considered carefully by your caregiver before blood is given. Blood is not given unless the benefits outweigh the risks.  AFTER SURGERY INSTRUCTIONS  Return to work: 4-6 weeks if applicable  You will have a white honeycomb dressing over your larger incision. This dressing can be removed 5 days after surgery and you do not need to reapply a new dressing. Once you remove the dressing, you will notice that you have the  surgical glue (dermabond) on the incision and this will peel off on its own. You can get this dressing wet in the shower the days after surgery prior to removal on the 5th day.   Activity: 1. Be up and out of the bed during the day.  Take a nap if needed.  You may walk up steps but be careful and use the hand rail.  Stair climbing will tire you more than you think, you may need to stop part way and rest.   2. No lifting or straining for 6 weeks over 10 pounds. No pushing, pulling, straining for 6 weeks.  3. No driving for around 1-2 week(s).  Do not drive if you are taking narcotic pain medicine and make sure that your reaction time has returned.   4. You can shower as soon as the next day after surgery. Shower daily.  Use your regular soap and water (not directly on the incision) and pat your incision(s) dry afterwards; don't rub.  No tub baths or submerging your body in water until cleared by your surgeon. If you have the soap that was given to you by pre-surgical testing that was used before surgery, you do not need to use it afterwards because this can irritate your incisions.   5. No sexual activity and nothing in the vagina for 8 weeks.  6. You may experience a small amount of clear drainage from your incision, which is normal.  If the drainage persists, increases, or changes color please call the office.  7. Do not use creams, lotions, or ointments such as neosporin on your incision after surgery until advised by your surgeon because they can cause removal of the dermabond glue on your incisions.    8. You may experience vaginal spotting after surgery or around the 6-8 week mark from surgery when the stitches at the top of the vagina begin to dissolve.  The spotting is normal but if you experience heavy bleeding, call our office.  9. Take Tylenol or ibuprofen first for pain and only use narcotic pain medication for severe pain not relieved by the Tylenol or Ibuprofen.  Monitor your Tylenol  intake to a max of 4,000 mg in a 24 hour period. You can alternate these medications after surgery.  Diet: 1. Low sodium Heart Healthy Diet is recommended but you are cleared to resume your normal (before surgery) diet after your procedure.  2. It is safe to use a laxative, such as Miralax  or Colace, if you have difficulty moving your bowels. You have been prescribed Sennakot at bedtime every evening to keep bowel movements regular and to prevent constipation.    Wound Care: 1. Keep clean and dry.  Shower daily.  Reasons to call the Doctor: Fever - Oral temperature greater than 100.4 degrees Fahrenheit Foul-smelling vaginal discharge Difficulty urinating Nausea and vomiting Increased pain at the site of the incision that is unrelieved with pain medicine. Difficulty breathing with or without chest pain New calf pain especially if only on one side Sudden, continuing increased vaginal bleeding with or without clots.   Contacts: For questions or concerns you should contact:  Dr. Jeral Pinch at [phone]  Joylene John, NP at [phone]  After Hours: call [phone] and have the GYN Oncologist paged/contacted (after 5 pm or on the weekends).  Messages sent via mychart are for non-urgent matters and are not responded to after hours so for urgent needs, please call the after hours number.

## 2021-06-11 NOTE — Progress Notes (Signed)
Gynecologic Oncology Return Clinic Visit  06/11/21  Reason for Visit: follow-up after 3 cycles of NACT  Treatment History: Oncology History  Ovarian cancer ([place])  04/01/2021 Initial Diagnosis   She notes that she noticed an umbilical hernia.  She was scheduled to see a surgeon about possible repair.  She also endorses intermittent pain in around the area of her right hip that she thought was secondary to her exercise routine.  Over the last week, she has had a decreased appetite.  She denies any nausea or emesis but endorses several months of abdominal bloating and early satiety.  She notes normal bowel function, which she describes as bowel movements at least daily.  She has had some increased urinary frequency and around [place] developed stress urinary incontinence when she runs.     04/04/2021 Imaging   US pelvis  Septate uterus, otherwise unremarkable.   Normal endometrial complex.   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   Recommend further assessment by CT imaging with IV and oral contrast.   04/05/2021 Tumor Marker   Patient's tumor was tested for the following markers: CA-125 Results of the tumor marker test revealed 826.   04/05/2021 Imaging   1. Large bilateral solid-appearing adnexal masses are identified concerning for ovarian neoplasm. There is soft tissue infiltration into the surrounding peritoneal cavity which partially encases nonobstructed loops of small bowel with probable serosal involvement. 2. Moderate volume of ascites identified within the abdomen and pelvis likely secondary to peritoneal carcinomatosis. Diagnostic paracentesis may be helpful for further workup. 3. Mild increased caliber of small bowel loops with a few air-fluid levels. However, there are no signs to suggest a bowel obstruction as enteric contrast material is noted up to the level of the hepatic  flexure.   04/10/2021 Initial Diagnosis   Ovarian cancer ([place])   04/10/2021 Procedure   Successful ultrasound-guided paracentesis yielding 1.4 liters of peritoneal fluid.   04/11/2021 Cancer Staging   Staging form: Ovary, Fallopian Tube, and Primary Peritoneal Carcinoma, AJCC 8th Edition - Clinical stage from 04/11/2021: Stage IIIC (cT3c, cN0, cM0) - Signed by Heath Lark, MD on 04/11/2021  Stage prefix: Initial diagnosis    04/18/2021 Tumor Marker   Patient's tumor was tested for the following markers: CA-125. Results of the tumor marker test revealed 970   04/22/2021 -  Chemotherapy    Patient is on Treatment Plan: OVARIAN CARBOPLATIN (AUC 6) / PACLITAXEL (175) Q21D X 6 CYCLES       05/14/2021 Tumor Marker   Patient's tumor was tested for the following markers: CA-125. Results of the tumor marker test revealed 877.   06/10/2021 Imaging   1. Today's study demonstrates a centrally stable disease when compared to prior examination from 04/05/2021, with large bilateral ovarian lesions and moderate volume of malignant ascites, as detailed above. 2. Trace right pleural effusion lying dependently.       Interval History: The patient presents today for interval follow-up.  She received cycle 3 of neoadjuvant carboplatin and paclitaxel on Wednesday the sixth.  She was doing very well until this last cycle of chemotherapy.  She notes that things are improving significantly since Sunday but she continues to have decreased appetite, nausea, emesis (has had 3 episodes of emesis in the last week), and changes to her taste.  She has continued to have bowel movements although has noted some constipation with only small bowel movements the last several days.  She is restarted Senokot and is going to start MiraLAX again this evening.  Prior to the last week, she denies any significant abdominal pain.  She reports good appetite without nausea and emesis.  She has been walking 3 to 4 miles a day.  Past  Medical/Surgical History: Past Medical History:  Diagnosis Date   Endometriosis    Family history of renal cancer 05/09/2021   History of anemia    Migraines    Osteoporosis     Past Surgical History:  Procedure Laterality Date   CESAREAN SECTION  1986, 1990   and BTSP   COLONOSCOPY     LAPAROSCOPY     for infertility-endometriosis   LAPAROSCOPY     2nd for infertility   mole removed     precancerous mole right upper abd   ORIF WRIST FRACTURE Right 08/02/2020   Procedure: Right distal radius open reduction internal fixation;  Surgeon: Verner Mould, MD;  Location: [facility];  Service: Orthopedics;  Laterality: Right;  36min   TONSILLECTOMY AND ADENOIDECTOMY      Family History  Problem Relation Age of Onset   Renal cancer Father 10       Agent Orange exposure   Hypertension Sister    Hypothyroidism Sister    Other Sister        Wegener's syndrome   Hypertension Brother    Diabetes Maternal Grandmother    Heart attack Paternal Grandfather     Social History   Socioeconomic History   Marital status: Married    Spouse name: Phil   Number of children: 2   Years of education: Not on file   Highest education level: Not on file  Occupational History   Occupation: retired  Tobacco Use   Smoking status: Former    Pack years: 0.00    Types: Cigarettes    Quit date: 12/02/1983    Years since quitting: 37.5   Smokeless tobacco: Never   Tobacco comments:    quit 1985  Vaping Use   Vaping Use: Never used  Substance and Sexual Activity   Alcohol use: Yes    Alcohol/week: 0.0 standard drinks    Comment: ocassional   Drug use: No   Sexual activity: Yes    Partners: Male    Birth control/protection: Surgical, Post-menopausal    Comment: BTL  Other Topics Concern   Not on file  Social History Narrative   Not on file   Social Determinants of Health   Financial Resource Strain: Not on file  Food Insecurity: Not on file  Transportation Needs: Not on file   Physical Activity: Not on file  Stress: Not on file  Social Connections: Not on file    Current Medications:  Current Outpatient Medications:    ibuprofen (ADVIL) 600 MG tablet, Take 1 tablet (600 mg total) by mouth every 6 (six) hours as needed for moderate pain. For AFTER surgery, Disp: 30 tablet, Rfl: 0   senna-docusate (SENOKOT-S) 8.6-50 MG tablet, Take 2 tablets by mouth at bedtime. For AFTER surgery, do not take if having diarrhea, Disp: 30 tablet, Rfl: 0   traMADol (ULTRAM) 50 MG tablet, Take 1 tablet (50 mg total) by mouth every 6 (six) hours as needed for severe pain. For AFTER surgery only, do not take and drive, Disp: 10 tablet, Rfl: 0   acetaminophen (TYLENOL) 325 MG tablet, Take 650 mg by mouth every 6 (six) hours as needed for mild pain. , Disp: , Rfl:  Calcium-Magnesium-Vitamin D (CALCIUM 500 PO), Take 500 mg by mouth daily. , Disp: , Rfl:    dexamethasone (DECADRON) 4 MG tablet, Take 2 tabs at the night before and 2 tab the morning of chemotherapy, every 3 weeks, by mouth x 6 cycles, Disp: 36 tablet, Rfl: 6   EPINEPHrine 0.3 mg/0.3 mL IJ SOAJ injection, Inject 0.3 mg as directed as needed (allergic reaction). , Disp: , Rfl:    ibuprofen (ADVIL) 200 MG tablet, Take 400 mg by mouth every 6 (six) hours as needed for mild pain or moderate pain., Disp: , Rfl:    Misc Natural Products (OSTEO BI-FLEX JOINT SHIELD PO), Take 1 tablet by mouth daily. , Disp: , Rfl:    Multiple Vitamins-Minerals (MULTIVITAMIN PO), Take 1 tablet by mouth daily. , Disp: , Rfl:    ondansetron (ZOFRAN) 8 MG tablet, Take 1 tablet (8 mg total) by mouth 2 (two) times daily as needed for refractory nausea / vomiting. Start on day 3 after carboplatin chemo., Disp: 30 tablet, Rfl: 1   prochlorperazine (COMPAZINE) 10 MG tablet, Take 1 tablet (10 mg total) by mouth every 6 (six) hours as needed (Nausea or vomiting)., Disp: 30 tablet, Rfl: 1   SUMAtriptan (IMITREX) 100 MG tablet, 1 tab with headache onset.  Can  repeat in 2 hours.  Max dosage 200mg /24 hours. (Patient not taking: Reported on 04/09/2021), Disp: 27 tablet, Rfl: 4  Review of Systems: Pertinent positives include decreased appetite, urinary frequency, incontinence, hot flashes, joint pain, dizziness/lightheadedness, headache, nausea. Denies fevers, chills, fatigue, unexplained weight changes. Denies hearing loss, neck lumps or masses, mouth sores, ringing in ears or voice changes. Denies cough or wheezing.  Denies shortness of breath. Denies chest pain or palpitations. Denies leg swelling. Denies abdominal distention, pain, blood in stools, constipation, diarrhea, vomiting, or early satiety. Denies pain with intercourse, dysuria, hematuria. Denies pelvic pain, vaginal bleeding or vaginal discharge.   Denies back pain or muscle pain/cramps. Denies itching, rash, or wounds. Denies numbness or seizures. Denies swollen lymph nodes or glands, denies easy bruising or bleeding. Denies anxiety, depression, confusion, or decreased concentration.  Physical Exam: BP (!) 132/95 (BP Location: Right Arm, Patient Position: Sitting)   Pulse 97   Temp (!) 97.4 F (36.3 C) (Tympanic)   Resp 18   Ht 5\' 4"  (1.626 m)   Wt 120 lb 3.2 oz (54.5 kg)   LMP 10/31/2008   SpO2 100%   BMI 20.63 kg/m  General: Alert, oriented, no acute distress. HEENT: Posterior oropharynx clear, sclera anicteric. Chest: Clear to auscultation bilaterally.  No wheezes or rhonchi. Cardiovascular: Regular rate and rhythm, no murmurs. Extremities: Grossly normal range of motion.  Warm, well perfused.  No edema bilaterally. Skin: No rashes or lesions noted. Lymphatics: No cervical, supraclavicular adenopathy.  Laboratory & Radiologic Studies: CBC    Component Value Date/Time   WBC 10.8 (H) 06/04/2021 0958   WBC 7.1 08/01/2020 0839   RBC 4.59 06/04/2021 0958   HGB 12.9 06/04/2021 0958   HCT 39.4 06/04/2021 0958   PLT 405 (H) 06/04/2021 0958   MCV 85.8 06/04/2021 0958    MCH 28.1 06/04/2021 0958   MCHC 32.7 06/04/2021 0958   RDW 15.6 (H) 06/04/2021 0958   LYMPHSABS 2.4 06/04/2021 0958   MONOABS 0.8 06/04/2021 0958   EOSABS 0.0 06/04/2021 0958   BASOSABS 0.0 06/04/2021 0958   CMP Latest Ref Rng & Units 06/04/2021 05/14/2021 04/18/2021  Glucose 70 - 99 mg/dL 91 144(H) 101(H)  BUN 8 - 23  mg/dL 18 20 21   Creatinine 0.44 - 1.00 mg/dL 0.71 0.70 0.74  Sodium 135 - 145 mmol/L 140 141 140  Potassium 3.5 - 5.1 mmol/L 4.2 4.0 3.8  Chloride 98 - 111 mmol/L 107 107 106  CO2 22 - 32 mmol/L 25 21(L) 26  Calcium 8.9 - 10.3 mg/dL 9.5 9.6 9.1  Total Protein 6.5 - 8.1 g/dL 7.0 7.3 6.8  Total Bilirubin 0.3 - 1.2 mg/dL <0.2(L) <0.2(L) 0.3  Alkaline Phos 38 - 126 U/L 83 84 78  AST 15 - 41 U/L 16 14(L) 17  ALT 0 - 44 U/L 9 9 6    CT A/P on 7/11: IMPRESSION: 1. Today's study demonstrates a centrally stable disease when compared to prior examination from 04/05/2021, with large bilateral ovarian lesions and moderate volume of malignant ascites, as detailed above. 2. Trace right pleural effusion lying dependently.  Assessment & Plan: Karen Winters is a 70 y.o. woman with platinum refractory ovarian cancer who presents for discussion of recent CT findings.  I spent some time speaking with the patient and her husband about recent CT.  Given mildly increasing CA-125 during neoadjuvant chemotherapy, CT scan was obtained yesterday, which shows no significant response to treatment.  She continues to have bilateral adnexal masses as well as imaging findings that raise the concern for carcinomatosis.  I discussed that the failure to respond to platinum therapy qualifies her with platinum refractory disease.  On one hand, there is evidence that this would be a contraindication to surgery.  I am concerned though that there may be something about her tumor histology or biology that is causing her lack of response to platinum.  Her diagnosis was by paracentesis.  If, for instance, she  has low-grade serous disease, this could explain her lack of treatment response.  I am willing to offer an attempt at surgical cytoreduction although we discussed the goals of such as surgery today and very explicit detail.  The first would be obtained tissue for pathology review to assure that we have the correct diagnosis.  The second would be for tissue to send for molecular testing to see if the patient has a actionable mutation.  The third would be to help decrease symptom burden or prolong the period until she developed symptoms.  I discussed the risk that I will not be able to perform optimal cytoreduction as well as the risk that I may not be able to perform surgery.  Given platinum refractory disease, I am not going to perform a significantly morbid surgery (such as one with multiple bowel resections).  In this setting, if surgery is not feasible, then I would obtain tissue for pathology as well as molecular testing.  The patient and her husband voiced understanding of the goals of surgery as well as the possible outcomes.  If surgery is possible, then this will be done for laparotomy.  Given increased risk of DVT in the postoperative setting, plan would be for 4 weeks of prophylactic anticoagulation.  She received her last cycle of chemotherapy on July 6.  Plan will be made tentatively for surgery on August 2.  This is pending laboratory evaluation closer to the time of surgery to assure that blood counts are adequate.  We discussed the plan for a exploratory laparotomy, possible biopsy, possible tumor debulking which may include bilateral salpingo-oophorectomy, total hysterectomy, and any other indicated procedures. The risks of surgery were discussed in detail and she understands these to include infection; wound separation; hernia; vaginal cuff  separation, injury to adjacent organs such as bowel, bladder, blood vessels, ureters and nerves; bleeding which may require blood transfusion; anesthesia  risk; thromboembolic events; possible death; unforeseen complications; possible need for re-exploration; medical complications such as heart attack, stroke, pleural effusion and pneumonia; and, if full lymphadenectomy is performed the risk of lymphedema and lymphocyst. The patient will receive DVT and antibiotic prophylaxis as indicated. She voiced a clear understanding. She had the opportunity to ask questions. Perioperative instructions were reviewed with her.   60 minutes of total time was spent for this patient encounter, including preparation, face-to-face counseling with the patient and coordination of care, and documentation of the encounter.  Jeral Pinch, MD  Division of Gynecologic Oncology  Department of Obstetrics and Gynecology  [facility]

## 2021-06-11 NOTE — Telephone Encounter (Signed)
Called Karen Winters and advised her that the copay for Lovenox will be $100.00 and that the pharmacy has to order it so it should be in on Friday.  She verbalized understanding.

## 2021-06-14 ENCOUNTER — Telehealth: Payer: Self-pay | Admitting: Oncology

## 2021-06-14 NOTE — Telephone Encounter (Signed)
Karen Winters called and wanted to make sure the hernia repair is added to her her surgery.  She also wanted to let Dr. Berline Lopes know she has increased her protein intake and drank a protein shake this morning.

## 2021-06-19 ENCOUNTER — Ambulatory Visit (HOSPITAL_BASED_OUTPATIENT_CLINIC_OR_DEPARTMENT_OTHER): Payer: Medicare HMO

## 2021-06-21 ENCOUNTER — Ambulatory Visit: Payer: Medicare HMO | Admitting: Gynecologic Oncology

## 2021-06-24 NOTE — Patient Instructions (Addendum)
DUE TO COVID-19 ONLY ONE VISITOR IS ALLOWED TO COME WITH YOU AND STAY IN THE WAITING ROOM ONLY DURING PRE OP AND PROCEDURE DAY OF SURGERY. THE 1 VISITOR  MAY VISIT WITH YOU AFTER SURGERY IN YOUR PRIVATE ROOM DURING VISITING HOURS ONLY!  YOU NEED TO HAVE A COVID 19 TEST ON: 06/28/21 @  8:00 AM,THIS TEST MUST BE DONE BEFORE SURGERY,  COVID TESTING SITE [place] [address], IT IS ON THE RIGHT GOING OUT [address] APPROXIMATELY  2 MINUTES PAST [employer] ON THE RIGHT. ONCE YOUR COVID TEST IS COMPLETED,  PLEASE BEGIN THE QUARANTINE INSTRUCTIONS AS OUTLINED IN YOUR HANDOUT.               Karen Winters   Your procedure is scheduled on: 07/02/21   Report to [facility] Main  Entrance   Report to admitting at : 10:00 AM     Call this number if you have problems the morning of surgery [phone]    Remember: Eat a light diet the day before surgery.  Examples including soups, broths, toast, yogurt, mashed potatoes.  Things to avoid include carbonated beverages (fizzy beverages), raw fruits and raw vegetables, or beans.   If your bowels are filled with gas, your surgeon will have difficulty visualizing your pelvic organs which increases your surgical risks.   Do not eat solid food :After Midnight. Clear liquids until: 9:30 AM.  CLEAR LIQUID DIET  Foods Allowed                                                                     Foods Excluded  Coffee and tea, regular and decaf                             liquids that you cannot  Plain Jell-O any favor except red or purple                                           see through such as: Fruit ices (not with fruit pulp)                                     milk, soups, orange juice  Iced Popsicles                                    All solid food Carbonated beverages, regular and diet                                    Cranberry, grape and apple juices Sports drinks like Gatorade Lightly seasoned clear  broth or consume(fat free) Sugar, honey syrup  Sample Menu Breakfast  Lunch                                     Supper Cranberry juice                    Beef broth                            Chicken broth Jell-O                                     Grape juice                           Apple juice Coffee or tea                        Jell-O                                      Popsicle                                                Coffee or tea                        Coffee or tea  _____________________________________________________________________   BRUSH YOUR TEETH MORNING OF SURGERY AND RINSE YOUR MOUTH OUT, NO CHEWING GUM CANDY OR MINTS.   Take these medicines the morning of surgery with A SIP OF WATER: Imitrex as needed.                               You may not have any metal on your body including hair pins and              piercings  Do not wear jewelry, make-up, lotions, powders or perfumes, deodorant             Do not wear nail polish on your fingernails.  Do not shave  48 hours prior to surgery.    Do not bring valuables to the hospital. [place].  Contacts, dentures or bridgework may not be worn into surgery.  Leave suitcase in the car. After surgery it may be brought to your room.     Patients discharged the day of surgery will not be allowed to drive home. IF YOU ARE HAVING SURGERY AND GOING HOME THE SAME DAY, YOU MUST HAVE AN ADULT TO DRIVE YOU HOME AND BE WITH YOU FOR 24 HOURS. YOU MAY GO HOME BY TAXI OR UBER OR ORTHERWISE, BUT AN ADULT MUST ACCOMPANY YOU HOME AND STAY WITH YOU FOR 24 HOURS.  Name and phone number of your driver:  Special Instructions: N/A              Please read over the following fact sheets you were given: _____________________________________________________________________  [facility] - Preparing for Surgery Before surgery, you can play an important  role.  Because skin is not sterile, your skin needs to be as free of germs as possible.  You can reduce the number of germs on your skin by washing with CHG (chlorahexidine gluconate) soap before surgery.  CHG is an antiseptic cleaner which kills germs and bonds with the skin to continue killing germs even after washing. Please DO NOT use if you have an allergy to CHG or antibacterial soaps.  If your skin becomes reddened/irritated stop using the CHG and inform your nurse when you arrive at Short Stay. Do not shave (including legs and underarms) for at least 48 hours prior to the first CHG shower.  You may shave your face/neck. Please follow these instructions carefully:  1.  Shower with CHG Soap the night before surgery and the  morning of Surgery.  2.  If you choose to wash your hair, wash your hair first as usual with your  normal  shampoo.  3.  After you shampoo, rinse your hair and body thoroughly to remove the  shampoo.                           4.  Use CHG as you would any other liquid soap.  You can apply chg directly  to the skin and wash                       Gently with a scrungie or clean washcloth.  5.  Apply the CHG Soap to your body ONLY FROM THE NECK DOWN.   Do not use on face/ open                           Wound or open sores. Avoid contact with eyes, ears mouth and genitals (private parts).                       Wash face,  Genitals (private parts) with your normal soap.             6.  Wash thoroughly, paying special attention to the area where your surgery  will be performed.  7.  Thoroughly rinse your body with warm water from the neck down.  8.  DO NOT shower/wash with your normal soap after using and rinsing off  the CHG Soap.                9.  Pat yourself dry with a clean towel.            10.  Wear clean pajamas.            11.  Place clean sheets on your bed the night of your first shower and do not  sleep with pets. Day of Surgery : Do not apply any lotions/deodorants  the morning of surgery.  Please wear clean clothes to the hospital/surgery center.  FAILURE TO FOLLOW THESE INSTRUCTIONS MAY RESULT IN THE CANCELLATION OF YOUR SURGERY PATIENT SIGNATURE_________________________________  NURSE SIGNATURE__________________________________  ________________________________________________________________________

## 2021-06-25 ENCOUNTER — Encounter (HOSPITAL_COMMUNITY): Payer: Self-pay

## 2021-06-25 ENCOUNTER — Other Ambulatory Visit: Payer: Self-pay

## 2021-06-25 ENCOUNTER — Encounter (HOSPITAL_COMMUNITY)
Admission: RE | Admit: 2021-06-25 | Discharge: 2021-06-25 | Disposition: A | Discharge status: Home / Self Care | Payer: Medicare HMO | Source: Ambulatory Visit | Attending: Gynecologic Oncology | Admitting: Gynecologic Oncology

## 2021-06-25 DIAGNOSIS — Z01812 Encounter for preprocedural laboratory examination: Secondary | ICD-10-CM | POA: Diagnosis present

## 2021-06-25 HISTORY — DX: Other specified postprocedural states: R11.2

## 2021-06-25 HISTORY — DX: Other specified postprocedural states: Z98.890

## 2021-06-25 HISTORY — DX: Other complications of anesthesia, initial encounter: T88.59XA

## 2021-06-25 HISTORY — DX: Bronchitis, not specified as acute or chronic: J40

## 2021-06-25 HISTORY — DX: Malignant (primary) neoplasm, unspecified: C80.1

## 2021-06-25 LAB — COMPREHENSIVE METABOLIC PANEL
ALT: 11 U/L (ref 0–44)
AST: 17 U/L (ref 15–41)
Albumin: 3.6 g/dL (ref 3.5–5.0)
Alkaline Phosphatase: 79 U/L (ref 38–126)
Anion gap: 5 (ref 5–15)
BUN: 16 mg/dL (ref 8–23)
CO2: 27 mmol/L (ref 22–32)
Calcium: 9.4 mg/dL (ref 8.9–10.3)
Chloride: 105 mmol/L (ref 98–111)
Creatinine, Ser: 0.6 mg/dL (ref 0.44–1.00)
GFR, Estimated: >60 mL/min (ref >60)
Glucose, Bld: 98 mg/dL (ref 70–99)
Potassium: 4.2 mmol/L (ref 3.5–5.1)
Sodium: 137 mmol/L (ref 135–145)
Total Bilirubin: 0.5 mg/dL (ref 0.3–1.2)
Total Protein: 7.3 g/dL (ref 6.5–8.1)

## 2021-06-25 LAB — URINALYSIS, ROUTINE W REFLEX MICROSCOPIC
Bilirubin Urine: NEGATIVE
Glucose, UA: NEGATIVE mg/dL
Ketones, ur: NEGATIVE mg/dL
Leukocytes,Ua: NEGATIVE
Nitrite: NEGATIVE
Protein, ur: NEGATIVE mg/dL
Specific Gravity, Urine: 1.021 (ref 1.005–1.030)
pH: 5 (ref 5.0–8.0)

## 2021-06-25 LAB — CBC
HCT: 41 % (ref 36.0–46.0)
Hemoglobin: 12.8 g/dL (ref 12.0–15.0)
MCH: 27.9 pg (ref 26.0–34.0)
MCHC: 31.2 g/dL (ref 30.0–36.0)
MCV: 89.5 fL (ref 80.0–100.0)
Platelets: 418 10*3/uL — ABNORMAL HIGH (ref 150–400)
RBC: 4.58 MIL/uL (ref 3.87–5.11)
RDW: 16.9 % — ABNORMAL HIGH (ref 11.5–15.5)
WBC: 7.2 10*3/uL (ref 4.0–10.5)
nRBC: 0 % (ref 0.0–0.2)

## 2021-06-25 NOTE — Progress Notes (Signed)
COVID Vaccine Completed: Yes Date COVID Vaccine completed: 04/09/21. Second boaster COVID vaccine manufacturer: [place]      PCP - Berkley Harvey: NP Cardiologist -   Chest x-ray -  EKG -  Stress Test -  ECHO -  Cardiac Cath -  Pacemaker/ICD device last checked:  Sleep Study -  CPAP -   Fasting Blood Sugar -  Checks Blood Sugar _____ times a day  Blood Thinner Instructions: Aspirin Instructions: Last Dose:  Anesthesia review:   Patient denies shortness of breath, fever, cough and chest pain at PAT appointment   Patient verbalized understanding of instructions that were given to them at the PAT appointment. Patient was also instructed that they will need to review over the PAT instructions again at home before surgery.

## 2021-06-27 ENCOUNTER — Ambulatory Visit: Payer: Medicare HMO | Admitting: Hematology and Oncology

## 2021-06-27 ENCOUNTER — Other Ambulatory Visit: Payer: Medicare HMO

## 2021-06-27 ENCOUNTER — Ambulatory Visit: Payer: Medicare HMO

## 2021-06-28 ENCOUNTER — Other Ambulatory Visit (HOSPITAL_COMMUNITY)
Admission: RE | Admit: 2021-06-28 | Discharge: 2021-06-28 | Disposition: A | Discharge status: Home / Self Care | Payer: Medicare HMO | Source: Ambulatory Visit | Attending: Gynecologic Oncology | Admitting: Gynecologic Oncology

## 2021-06-28 DIAGNOSIS — Z20822 Contact with and (suspected) exposure to covid-19: Secondary | ICD-10-CM | POA: Diagnosis not present

## 2021-06-28 DIAGNOSIS — Z01812 Encounter for preprocedural laboratory examination: Secondary | ICD-10-CM | POA: Insufficient documentation

## 2021-06-28 LAB — SARS CORONAVIRUS 2 (TAT 6-24 HRS): SARS Coronavirus 2: NEGATIVE

## 2021-07-01 ENCOUNTER — Telehealth: Payer: Self-pay

## 2021-07-01 NOTE — Anesthesia Preprocedure Evaluation (Addendum)
Anesthesia Evaluation  Patient identified by MRN, date of birth, ID band Patient awake    Reviewed: Allergy & Precautions, NPO status , Patient's Chart, lab work & pertinent test results  History of Anesthesia Complications (+) PONV  Airway Mallampati: II  TM Distance: >3 FB Neck ROM: Full    Dental no notable dental hx. (+) Teeth Intact, Dental Advisory Given   Pulmonary former smoker,    Pulmonary exam normal breath sounds clear to auscultation       Cardiovascular Normal cardiovascular exam Rhythm:Regular Rate:Normal     Neuro/Psych  Headaches,    GI/Hepatic Neg liver ROS,   Endo/Other  negative endocrine ROS  Renal/GU Lab Results      Component                Value               Date                      CREATININE               0.60                06/25/2021                BUN                      16                  06/25/2021                NA                       137                 06/25/2021                K                        4.2                 06/25/2021                CL                       105                 06/25/2021                CO2                      27                  06/25/2021              Ovarian CA S/p neo adjuvant chemo    Musculoskeletal negative musculoskeletal ROS (+)   Abdominal   Peds  Hematology Lab Results      Component                Value               Date                      WBC  7.2                 06/25/2021                HGB                      12.8                06/25/2021                HCT                      41.0                06/25/2021                MCV                      89.5                06/25/2021                PLT                      418 (H)             06/25/2021              Anesthesia Other Findings All: latex, PCn  Reproductive/Obstetrics                            Anesthesia  Physical Anesthesia Plan  ASA: 3  Anesthesia Plan: General   Post-op Pain Management:    Induction: Intravenous  PONV Risk Score and Plan: 4 or greater and Treatment may vary due to age or medical condition, Midazolam, Dexamethasone, Ondansetron and Scopolamine patch - Pre-op  Airway Management Planned: Oral ETT  Additional Equipment: None  Intra-op Plan:   Post-operative Plan: Extubation in OR  Informed Consent: I have reviewed the patients History and Physical, chart, labs and discussed the procedure including the risks, benefits and alternatives for the proposed anesthesia with the patient or authorized representative who has indicated his/her understanding and acceptance.     Dental advisory given  Plan Discussed with:   Anesthesia Plan Comments: (GA + lido + Ketamine)       Anesthesia Quick Evaluation

## 2021-07-01 NOTE — Telephone Encounter (Signed)
Karen Winters stated that she understood her pre-op instructions. She is arriving at [facility] tomorrow at [facility] admitting. She has no questions at this time.

## 2021-07-02 ENCOUNTER — Encounter (HOSPITAL_COMMUNITY)
Admission: RE | Disposition: A | Discharge status: Home / Self Care | Payer: Self-pay | Source: Home / Self Care | Attending: Gynecologic Oncology

## 2021-07-02 ENCOUNTER — Encounter (HOSPITAL_COMMUNITY): Payer: Self-pay | Admitting: Gynecologic Oncology

## 2021-07-02 ENCOUNTER — Inpatient Hospital Stay (HOSPITAL_COMMUNITY): Payer: Medicare HMO | Admitting: Anesthesiology

## 2021-07-02 ENCOUNTER — Inpatient Hospital Stay (HOSPITAL_COMMUNITY)
Admission: RE | Admit: 2021-07-02 | Discharge: 2021-07-04 | DRG: 737 | Disposition: A | Discharge status: Home / Self Care | Payer: Medicare HMO | Attending: Gynecologic Oncology | Admitting: Gynecologic Oncology

## 2021-07-02 DIAGNOSIS — G43909 Migraine, unspecified, not intractable, without status migrainosus: Secondary | ICD-10-CM | POA: Diagnosis present

## 2021-07-02 DIAGNOSIS — M810 Age-related osteoporosis without current pathological fracture: Secondary | ICD-10-CM | POA: Diagnosis present

## 2021-07-02 DIAGNOSIS — C563 Malignant neoplasm of bilateral ovaries: Principal | ICD-10-CM

## 2021-07-02 DIAGNOSIS — K660 Peritoneal adhesions (postprocedural) (postinfection): Secondary | ICD-10-CM | POA: Diagnosis present

## 2021-07-02 DIAGNOSIS — N979 Female infertility, unspecified: Secondary | ICD-10-CM | POA: Diagnosis present

## 2021-07-02 DIAGNOSIS — K5900 Constipation, unspecified: Secondary | ICD-10-CM | POA: Diagnosis present

## 2021-07-02 DIAGNOSIS — Z833 Family history of diabetes mellitus: Secondary | ICD-10-CM | POA: Diagnosis not present

## 2021-07-02 DIAGNOSIS — Z8051 Family history of malignant neoplasm of kidney: Secondary | ICD-10-CM | POA: Diagnosis not present

## 2021-07-02 DIAGNOSIS — C569 Malignant neoplasm of unspecified ovary: Secondary | ICD-10-CM

## 2021-07-02 DIAGNOSIS — C763 Malignant neoplasm of pelvis: Secondary | ICD-10-CM | POA: Diagnosis present

## 2021-07-02 DIAGNOSIS — Z8249 Family history of ischemic heart disease and other diseases of the circulatory system: Secondary | ICD-10-CM

## 2021-07-02 DIAGNOSIS — K429 Umbilical hernia without obstruction or gangrene: Secondary | ICD-10-CM | POA: Diagnosis present

## 2021-07-02 DIAGNOSIS — N809 Endometriosis, unspecified: Secondary | ICD-10-CM | POA: Diagnosis present

## 2021-07-02 DIAGNOSIS — Z20822 Contact with and (suspected) exposure to covid-19: Secondary | ICD-10-CM | POA: Diagnosis present

## 2021-07-02 DIAGNOSIS — R188 Other ascites: Secondary | ICD-10-CM | POA: Diagnosis present

## 2021-07-02 DIAGNOSIS — Z87891 Personal history of nicotine dependence: Secondary | ICD-10-CM | POA: Diagnosis not present

## 2021-07-02 HISTORY — PX: HYSTERECTOMY ABDOMINAL WITH SALPINGO-OOPHORECTOMY: SHX6792

## 2021-07-02 HISTORY — PX: DEBULKING: SHX6277

## 2021-07-02 LAB — TYPE AND SCREEN
ABO/RH(D): O POS
Antibody Screen: NEGATIVE

## 2021-07-02 LAB — ABO/RH: ABO/RH(D): O POS

## 2021-07-02 SURGERY — HYSTERECTOMY, ABDOMINAL, WITH SALPINGO-OOPHORECTOMY
Anesthesia: General | Site: Abdomen

## 2021-07-02 MED ORDER — SUGAMMADEX SODIUM 200 MG/2ML IV SOLN
INTRAVENOUS | Status: DC | PRN
  Administered 2021-07-02: 200 mg via INTRAVENOUS

## 2021-07-02 MED ORDER — HYDROMORPHONE HCL 1 MG/ML IJ SOLN
0.5000 mg | INTRAMUSCULAR | Status: DC | PRN
  Administered 2021-07-02: 0.5 mg via INTRAVENOUS
  Filled 2021-07-02: qty 0.5

## 2021-07-02 MED ORDER — PHENYLEPHRINE 40 MCG/ML (10ML) SYRINGE FOR IV PUSH (FOR BLOOD PRESSURE SUPPORT)
PREFILLED_SYRINGE | INTRAVENOUS | Status: AC
  Filled 2021-07-02: qty 10

## 2021-07-02 MED ORDER — ORAL CARE MOUTH RINSE: 15.0000 mL | Freq: Once | OROMUCOSAL | Status: AC

## 2021-07-02 MED ORDER — DEXAMETHASONE SODIUM PHOSPHATE 10 MG/ML IJ SOLN
INTRAMUSCULAR | Status: AC
  Filled 2021-07-02: qty 1

## 2021-07-02 MED ORDER — FENTANYL CITRATE (PF) 250 MCG/5ML IJ SOLN
INTRAMUSCULAR | Status: AC
  Filled 2021-07-02: qty 5

## 2021-07-02 MED ORDER — CEFAZOLIN SODIUM-DEXTROSE 2-4 GM/100ML-% IV SOLN
2.0000 g | INTRAVENOUS | Status: AC
  Administered 2021-07-02: 2 g via INTRAVENOUS
  Filled 2021-07-02: qty 100

## 2021-07-02 MED ORDER — ONDANSETRON HCL 4 MG/2ML IJ SOLN
INTRAMUSCULAR | Status: AC
  Filled 2021-07-02: qty 2

## 2021-07-02 MED ORDER — IBUPROFEN 400 MG PO TABS
600.0000 mg | ORAL_TABLET | Freq: Three times a day (TID) | ORAL | Status: DC
  Administered 2021-07-03 – 2021-07-04 (×4): 600 mg via ORAL
  Filled 2021-07-02 (×4): qty 1

## 2021-07-02 MED ORDER — CHEWING GUM (ORBIT) SUGAR FREE
1.0000 | CHEWING_GUM | Freq: Three times a day (TID) | ORAL | Status: DC
  Administered 2021-07-02 – 2021-07-04 (×6): 1 via ORAL
  Filled 2021-07-02: qty 1

## 2021-07-02 MED ORDER — KCL IN DEXTROSE-NACL 20-5-0.45 MEQ/L-%-% IV SOLN
INTRAVENOUS | Status: DC
  Filled 2021-07-02 (×2): qty 1000

## 2021-07-02 MED ORDER — PREGABALIN 25 MG PO CAPS
25.0000 mg | ORAL_CAPSULE | Freq: Two times a day (BID) | ORAL | Status: DC
  Administered 2021-07-03 – 2021-07-04 (×3): 25 mg via ORAL
  Filled 2021-07-02 (×3): qty 1

## 2021-07-02 MED ORDER — ALBUMIN HUMAN 5 % IV SOLN: INTRAVENOUS | Status: DC | PRN

## 2021-07-02 MED ORDER — LIDOCAINE 2% (20 MG/ML) 5 ML SYRINGE
INTRAMUSCULAR | Status: DC | PRN
  Administered 2021-07-02: 1.5 mg/kg/h via INTRAVENOUS

## 2021-07-02 MED ORDER — POVIDONE-IODINE 10 % EX SWAB
2.0000 "application " | Freq: Once | CUTANEOUS | Status: AC
  Administered 2021-07-02: 2 via TOPICAL

## 2021-07-02 MED ORDER — MIDAZOLAM HCL 2 MG/2ML IJ SOLN
INTRAMUSCULAR | Status: DC | PRN
  Administered 2021-07-02: 2 mg via INTRAVENOUS

## 2021-07-02 MED ORDER — PROPOFOL 10 MG/ML IV BOLUS
INTRAVENOUS | Status: AC
  Filled 2021-07-02: qty 20

## 2021-07-02 MED ORDER — ENSURE ENLIVE PO LIQD
237.0000 mL | Freq: Two times a day (BID) | ORAL | Status: DC
  Administered 2021-07-03 (×2): 237 mL via ORAL

## 2021-07-02 MED ORDER — PHENYLEPHRINE HCL (PRESSORS) 10 MG/ML IV SOLN
INTRAVENOUS | Status: DC | PRN
  Administered 2021-07-02 (×2): 40 ug via INTRAVENOUS
  Administered 2021-07-02: 80 ug via INTRAVENOUS
  Administered 2021-07-02: 40 ug via INTRAVENOUS
  Administered 2021-07-02: 80 ug via INTRAVENOUS
  Administered 2021-07-02: 40 ug via INTRAVENOUS
  Administered 2021-07-02 (×2): 80 ug via INTRAVENOUS
  Administered 2021-07-02 (×2): 40 ug via INTRAVENOUS

## 2021-07-02 MED ORDER — BUPIVACAINE LIPOSOME 1.3 % IJ SUSP
20.0000 mL | Freq: Once | INTRAMUSCULAR | Status: AC
  Administered 2021-07-02: 20 mL
  Filled 2021-07-02: qty 20

## 2021-07-02 MED ORDER — PROPOFOL 10 MG/ML IV BOLUS
INTRAVENOUS | Status: DC | PRN
  Administered 2021-07-02: 10 mg via INTRAVENOUS
  Administered 2021-07-02: 90 mg via INTRAVENOUS

## 2021-07-02 MED ORDER — ACETAMINOPHEN 500 MG PO TABS
1000.0000 mg | ORAL_TABLET | Freq: Two times a day (BID) | ORAL | Status: DC
  Administered 2021-07-02 – 2021-07-04 (×4): 1000 mg via ORAL
  Filled 2021-07-02 (×4): qty 2

## 2021-07-02 MED ORDER — HYDROMORPHONE HCL 1 MG/ML IJ SOLN
0.2500 mg | INTRAMUSCULAR | Status: DC | PRN
  Administered 2021-07-02: 0.5 mg via INTRAVENOUS

## 2021-07-02 MED ORDER — SENNOSIDES-DOCUSATE SODIUM 8.6-50 MG PO TABS
2.0000 | ORAL_TABLET | Freq: Every day | ORAL | Status: DC
  Administered 2021-07-02 – 2021-07-03 (×2): 2 via ORAL
  Filled 2021-07-02 (×2): qty 2

## 2021-07-02 MED ORDER — SODIUM CHLORIDE 0.9 % IV SOLN
INTRAVENOUS | Status: AC | PRN
  Administered 2021-07-02: 20 mL via INTRAMUSCULAR

## 2021-07-02 MED ORDER — ENOXAPARIN (LOVENOX) PATIENT EDUCATION KIT
PACK | Freq: Once | Status: AC
  Filled 2021-07-02: qty 1

## 2021-07-02 MED ORDER — ONDANSETRON HCL 4 MG/2ML IJ SOLN
4.0000 mg | Freq: Four times a day (QID) | INTRAMUSCULAR | Status: DC | PRN
  Administered 2021-07-02: 4 mg via INTRAVENOUS
  Filled 2021-07-02: qty 2

## 2021-07-02 MED ORDER — ONDANSETRON HCL 4 MG/2ML IJ SOLN: 4.0000 mg | Freq: Once | INTRAMUSCULAR | Status: DC | PRN

## 2021-07-02 MED ORDER — HYDROMORPHONE HCL 1 MG/ML IJ SOLN
INTRAMUSCULAR | Status: AC
  Administered 2021-07-02: 0.5 mg via INTRAVENOUS
  Filled 2021-07-02: qty 1

## 2021-07-02 MED ORDER — 0.9 % SODIUM CHLORIDE (POUR BTL) OPTIME
TOPICAL | Status: DC | PRN
  Administered 2021-07-02: 1000 mL

## 2021-07-02 MED ORDER — ALBUMIN HUMAN 5 % IV SOLN
INTRAVENOUS | Status: AC
  Filled 2021-07-02: qty 500

## 2021-07-02 MED ORDER — TRAMADOL HCL 50 MG PO TABS
100.0000 mg | ORAL_TABLET | Freq: Two times a day (BID) | ORAL | Status: DC | PRN
  Administered 2021-07-03: 100 mg via ORAL
  Filled 2021-07-02: qty 2

## 2021-07-02 MED ORDER — LIDOCAINE 2% (20 MG/ML) 5 ML SYRINGE
INTRAMUSCULAR | Status: AC
  Filled 2021-07-02: qty 15

## 2021-07-02 MED ORDER — CHLORHEXIDINE GLUCONATE 0.12 % MT SOLN
15.0000 mL | Freq: Once | OROMUCOSAL | Status: AC
  Administered 2021-07-02: 15 mL via OROMUCOSAL

## 2021-07-02 MED ORDER — ONDANSETRON HCL 4 MG PO TABS: 4.0000 mg | ORAL_TABLET | Freq: Four times a day (QID) | ORAL | Status: DC | PRN

## 2021-07-02 MED ORDER — OXYCODONE HCL 5 MG PO TABS: 5.0000 mg | ORAL_TABLET | Freq: Once | ORAL | Status: DC | PRN

## 2021-07-02 MED ORDER — KETAMINE HCL 10 MG/ML IJ SOLN
INTRAMUSCULAR | Status: DC | PRN
  Administered 2021-07-02: 30 mg via INTRAVENOUS

## 2021-07-02 MED ORDER — STERILE WATER FOR IRRIGATION IR SOLN
Status: DC | PRN
  Administered 2021-07-02: 1000 mL

## 2021-07-02 MED ORDER — BUPIVACAINE HCL 0.25 % IJ SOLN
INTRAMUSCULAR | Status: DC | PRN
  Administered 2021-07-02: 20 mL

## 2021-07-02 MED ORDER — KETOROLAC TROMETHAMINE 30 MG/ML IJ SOLN: 30.0000 mg | Freq: Once | INTRAMUSCULAR | Status: DC | PRN

## 2021-07-02 MED ORDER — NON FORMULARY: 1.0000 [IU] | Freq: Three times a day (TID) | Status: DC

## 2021-07-02 MED ORDER — OXYCODONE HCL 5 MG/5ML PO SOLN: 5.0000 mg | Freq: Once | ORAL | Status: DC | PRN

## 2021-07-02 MED ORDER — BUPIVACAINE HCL 0.25 % IJ SOLN
INTRAMUSCULAR | Status: AC
  Filled 2021-07-02: qty 1

## 2021-07-02 MED ORDER — ACETAMINOPHEN 500 MG PO TABS
1000.0000 mg | ORAL_TABLET | ORAL | Status: AC
Start: 2021-07-02 — End: 2021-07-02
  Administered 2021-07-02: 1000 mg via ORAL
  Filled 2021-07-02: qty 2

## 2021-07-02 MED ORDER — AMISULPRIDE (ANTIEMETIC) 5 MG/2ML IV SOLN: 10.0000 mg | Freq: Once | INTRAVENOUS | Status: DC | PRN

## 2021-07-02 MED ORDER — ENOXAPARIN SODIUM 40 MG/0.4ML IJ SOSY
40.0000 mg | PREFILLED_SYRINGE | INTRAMUSCULAR | Status: DC
  Administered 2021-07-03 – 2021-07-04 (×2): 40 mg via SUBCUTANEOUS
  Filled 2021-07-02 (×2): qty 0.4

## 2021-07-02 MED ORDER — ROCURONIUM BROMIDE 10 MG/ML (PF) SYRINGE
PREFILLED_SYRINGE | INTRAVENOUS | Status: AC
  Filled 2021-07-02: qty 10

## 2021-07-02 MED ORDER — ONDANSETRON HCL 4 MG/2ML IJ SOLN
INTRAMUSCULAR | Status: DC | PRN
Start: 2021-07-02 — End: 2021-07-02
  Administered 2021-07-02: 4 mg via INTRAVENOUS

## 2021-07-02 MED ORDER — DEXAMETHASONE SODIUM PHOSPHATE 10 MG/ML IJ SOLN
INTRAMUSCULAR | Status: DC | PRN
  Administered 2021-07-02: 10 mg via INTRAVENOUS

## 2021-07-02 MED ORDER — LACTATED RINGERS IV SOLN
INTRAVENOUS | Status: DC
Start: 2021-07-02 — End: 2021-07-02

## 2021-07-02 MED ORDER — MIDAZOLAM HCL 2 MG/2ML IJ SOLN
INTRAMUSCULAR | Status: AC
  Filled 2021-07-02: qty 2

## 2021-07-02 MED ORDER — SCOPOLAMINE 1 MG/3DAYS TD PT72
1.0000 | MEDICATED_PATCH | TRANSDERMAL | Status: DC
  Administered 2021-07-02: 1.5 mg via TRANSDERMAL
  Filled 2021-07-02: qty 1

## 2021-07-02 MED ORDER — ROCURONIUM BROMIDE 10 MG/ML (PF) SYRINGE
PREFILLED_SYRINGE | INTRAVENOUS | Status: DC | PRN
  Administered 2021-07-02: 10 mg via INTRAVENOUS
  Administered 2021-07-02: 20 mg via INTRAVENOUS
  Administered 2021-07-02: 40 mg via INTRAVENOUS

## 2021-07-02 MED ORDER — KETAMINE HCL 10 MG/ML IJ SOLN
INTRAMUSCULAR | Status: AC
  Filled 2021-07-02: qty 1

## 2021-07-02 MED ORDER — HEPARIN SODIUM (PORCINE) 5000 UNIT/ML IJ SOLN
5000.0000 [IU] | INTRAMUSCULAR | Status: AC
  Administered 2021-07-02: 5000 [IU] via SUBCUTANEOUS
  Filled 2021-07-02: qty 1

## 2021-07-02 MED ORDER — OXYCODONE HCL 5 MG PO TABS: 5.0000 mg | ORAL_TABLET | ORAL | Status: DC | PRN

## 2021-07-02 MED ORDER — DEXAMETHASONE SODIUM PHOSPHATE 4 MG/ML IJ SOLN
4.0000 mg | INTRAMUSCULAR | Status: DC
Start: 2021-07-02 — End: 2021-07-02

## 2021-07-02 MED ORDER — SODIUM CHLORIDE (PF) 0.9 % IJ SOLN
INTRAMUSCULAR | Status: AC
  Filled 2021-07-02: qty 20

## 2021-07-02 MED ORDER — LIDOCAINE HCL (PF) 2 % IJ SOLN
INTRAMUSCULAR | Status: AC
  Filled 2021-07-02: qty 10

## 2021-07-02 MED ORDER — FENTANYL CITRATE (PF) 250 MCG/5ML IJ SOLN
INTRAMUSCULAR | Status: DC | PRN
  Administered 2021-07-02: 50 ug via INTRAVENOUS
  Administered 2021-07-02 (×3): 25 ug via INTRAVENOUS
  Administered 2021-07-02: 50 ug via INTRAVENOUS

## 2021-07-02 MED ORDER — LIDOCAINE HCL (CARDIAC) PF 100 MG/5ML IV SOSY
PREFILLED_SYRINGE | INTRAVENOUS | Status: DC | PRN
  Administered 2021-07-02: 60 mg via INTRAVENOUS

## 2021-07-02 MED ORDER — GABAPENTIN 100 MG PO CAPS
100.0000 mg | ORAL_CAPSULE | ORAL | Status: AC
  Administered 2021-07-02: 100 mg via ORAL
  Filled 2021-07-02: qty 1

## 2021-07-02 SURGICAL SUPPLY — 68 items
ATTRACTOMAT 16X20 MAGNETIC DRP (DRAPES) IMPLANT
BAG COUNTER SPONGE SURGICOUNT (BAG) IMPLANT
BLADE EXTENDED COATED 6.5IN (ELECTRODE) ×3 IMPLANT
CELLS DAT CNTRL 66122 CELL SVR (MISCELLANEOUS) IMPLANT
CHLORAPREP W/TINT 26 (MISCELLANEOUS) ×3 IMPLANT
CLIP TI LARGE 6 (CLIP) ×3 IMPLANT
CLIP TI MEDIUM 6 (CLIP) ×3 IMPLANT
CLIP TI MEDIUM LARGE 6 (CLIP) ×3 IMPLANT
CNTNR URN SCR LID CUP LEK RST (MISCELLANEOUS) IMPLANT
CONT SPEC 4OZ STRL OR WHT (MISCELLANEOUS)
DERMABOND ADVANCED (GAUZE/BANDAGES/DRESSINGS) ×2
DERMABOND ADVANCED .7 DNX12 (GAUZE/BANDAGES/DRESSINGS) ×4 IMPLANT
DRAPE INCISE IOBAN 66X45 STRL (DRAPES) IMPLANT
DRAPE SURG IRRIG POUCH 19X23 (DRAPES) ×3 IMPLANT
DRAPE WARM FLUID 44X44 (DRAPES) ×3 IMPLANT
DRSG OPSITE POSTOP 4X10 (GAUZE/BANDAGES/DRESSINGS) ×3 IMPLANT
DRSG OPSITE POSTOP 4X6 (GAUZE/BANDAGES/DRESSINGS) IMPLANT
DRSG OPSITE POSTOP 4X8 (GAUZE/BANDAGES/DRESSINGS) IMPLANT
ELECT REM PT RETURN 15FT ADLT (MISCELLANEOUS) ×3 IMPLANT
GAUZE 4X4 16PLY ~~LOC~~+RFID DBL (SPONGE) IMPLANT
GLOVE SURG ENC MOIS LTX SZ6 (GLOVE) ×6 IMPLANT
GLOVE SURG ENC MOIS LTX SZ6.5 (GLOVE) ×6 IMPLANT
GOWN STRL REUS W/ TWL LRG LVL3 (GOWN DISPOSABLE) ×4 IMPLANT
GOWN STRL REUS W/TWL LRG LVL3 (GOWN DISPOSABLE) ×6
HANDLE SUCTION POOLE (INSTRUMENTS) ×2 IMPLANT
HEMOSTAT ARISTA ABSORB 3G PWDR (HEMOSTASIS) IMPLANT
KIT BASIN OR (CUSTOM PROCEDURE TRAY) ×3 IMPLANT
KIT TURNOVER KIT A (KITS) ×3 IMPLANT
LIGASURE IMPACT 36 18CM CVD LR (INSTRUMENTS) ×3 IMPLANT
LOOP VESSEL MAXI BLUE (MISCELLANEOUS) IMPLANT
NEEDLE HYPO 22GX1.5 SAFETY (NEEDLE) ×6 IMPLANT
NS IRRIG 1000ML POUR BTL (IV SOLUTION) ×6 IMPLANT
PACK GENERAL/GYN (CUSTOM PROCEDURE TRAY) ×3 IMPLANT
PENCIL SMOKE EVACUATOR (MISCELLANEOUS) IMPLANT
RELOAD PROXIMATE 75MM BLUE (ENDOMECHANICALS) IMPLANT
RELOAD PROXIMATE TA60MM BLUE (ENDOMECHANICALS) IMPLANT
RETRACTOR WND ALEXIS 25 LRG (MISCELLANEOUS) IMPLANT
RTRCTR WOUND ALEXIS 18CM MED (MISCELLANEOUS)
RTRCTR WOUND ALEXIS 25CM LRG (MISCELLANEOUS)
SCRUB EXIDINE 4% CHG 4OZ (MISCELLANEOUS) ×3 IMPLANT
SET IRRIG Y TYPE TUR BLADDER L (SET/KITS/TRAYS/PACK) ×3 IMPLANT
SHEET LAVH (DRAPES) ×3 IMPLANT
SLEEVE SUCTION CATH 165 (SLEEVE) ×3 IMPLANT
SPONGE T-LAP 18X18 ~~LOC~~+RFID (SPONGE) IMPLANT
STAPLER GUN LINEAR PROX 60 (STAPLE) IMPLANT
STAPLER PROXIMATE 75MM BLUE (STAPLE) IMPLANT
STAPLER VISISTAT 35W (STAPLE) IMPLANT
SUCTION POOLE HANDLE (INSTRUMENTS) ×3
SURGIFLO W/THROMBIN 8M KIT (HEMOSTASIS) IMPLANT
SUT MNCRL AB 4-0 PS2 18 (SUTURE) ×6 IMPLANT
SUT PDS AB 1 TP1 96 (SUTURE) ×6 IMPLANT
SUT SILK 3 0 SH CR/8 (SUTURE) IMPLANT
SUT VIC AB 0 CT1 36 (SUTURE) ×12 IMPLANT
SUT VIC AB 2-0 CT1 27 (SUTURE) ×6
SUT VIC AB 2-0 CT1 36 (SUTURE) ×6 IMPLANT
SUT VIC AB 2-0 CT1 TAPERPNT 27 (SUTURE) ×4 IMPLANT
SUT VIC AB 2-0 CT2 27 (SUTURE) ×18 IMPLANT
SUT VIC AB 2-0 SH 27 (SUTURE)
SUT VIC AB 2-0 SH 27X BRD (SUTURE) IMPLANT
SUT VIC AB 3-0 CTX 36 (SUTURE) IMPLANT
SUT VIC AB 3-0 SH 18 (SUTURE) IMPLANT
SUT VIC AB 3-0 SH 27 (SUTURE) ×3
SUT VIC AB 3-0 SH 27X BRD (SUTURE) ×2 IMPLANT
SYR 30ML LL (SYRINGE) ×6 IMPLANT
TOWEL OR 17X26 10 PK STRL BLUE (TOWEL DISPOSABLE) ×3 IMPLANT
TOWEL OR NON WOVEN STRL DISP B (DISPOSABLE) ×3 IMPLANT
TRAY FOLEY MTR SLVR 16FR STAT (SET/KITS/TRAYS/PACK) ×3 IMPLANT
UNDERPAD 30X36 HEAVY ABSORB (UNDERPADS AND DIAPERS) ×3 IMPLANT

## 2021-07-02 NOTE — Anesthesia Postprocedure Evaluation (Signed)
Anesthesia Post Note  Patient: Karen Winters  Procedure(s) Performed: HYSTERECTOMY ABDOMINAL WITH BILATERAL SALPINGO-OOPHORECTOMY; OMENTECTOMY (Abdomen) TUMOR DEBULKING (Abdomen)     Patient location during evaluation: PACU Anesthesia Type: General Level of consciousness: awake and alert Pain management: pain level controlled Vital Signs Assessment: post-procedure vital signs reviewed and stable Respiratory status: spontaneous breathing, nonlabored ventilation, respiratory function stable and patient connected to nasal cannula oxygen Cardiovascular status: blood pressure returned to baseline and stable Postop Assessment: no apparent nausea or vomiting Anesthetic complications: no   No notable events documented.  Last Vitals:  Vitals:   07/02/21 1345 07/02/21 1400  BP: 107/65 106/69  Pulse: 65 66  Resp: 20 15  Temp:    SpO2: 100% 100%    Last Pain:  Vitals:   07/02/21 1345  TempSrc:   PainSc: (P) Asleep                 Barnet Glasgow

## 2021-07-02 NOTE — Transfer of Care (Signed)
Immediate Anesthesia Transfer of Care Note  Patient: Karen Winters  Procedure(s) Performed: HYSTERECTOMY ABDOMINAL WITH BILATERAL SALPINGO-OOPHORECTOMY; OMENTECTOMY (Abdomen) TUMOR DEBULKING (Abdomen)  Patient Location: PACU  Anesthesia Type:General  Level of Consciousness: awake, alert , oriented, drowsy and patient cooperative  Airway & Oxygen Therapy: Patient Spontanous Breathing and Patient connected to face mask oxygen  Post-op Assessment: Report given to RN and Post -op Vital signs reviewed and stable  Post vital signs: Reviewed and stable  Last Vitals:  Vitals Value Taken Time  BP 106/62 07/02/21 1254  Temp    Pulse 66 07/02/21 1255  Resp 20 07/02/21 1255  SpO2 100 % 07/02/21 1255  Vitals shown include unvalidated device data.  Last Pain:  Vitals:   07/02/21 0906  TempSrc:   PainSc: 0-No pain      Patients Stated Pain Goal: 3 ([id number])  Complications: No notable events documented.

## 2021-07-02 NOTE — Brief Op Note (Signed)
07/02/2021  12:48 PM  PATIENT:  Lysle Pearl Eschbach  70 y.o. female  PRE-OPERATIVE DIAGNOSIS:  OVARIAN CANCER  POST-OPERATIVE DIAGNOSIS:  OVARIAN CANCER  PROCEDURE:  Procedure(s): HYSTERECTOMY ABDOMINAL WITH BILATERAL SALPINGO-OOPHORECTOMY; OMENTECTOMY (N/A) TUMOR DEBULKING (N/A)  SURGEON:  Surgeon(s) and Role:    Lafonda Mosses, MD - Primary    * Lahoma Crocker, MD - Assisting  ASSISTANTS: Nelva Nay   ANESTHESIA:   general  EBL:  250 mL   BLOOD ADMINISTERED:none  DRAINS: none   LOCAL MEDICATIONS USED:  Exparel  SPECIMEN:  uterus, cervix, tumor plaque vs treatment effect on the peritoneum, bilateral tubes and ovaries, omentum  DISPOSITION OF SPECIMEN:  PATHOLOGY  COUNTS:  YES  TOURNIQUET:  * No tourniquets in log *  DICTATION: .Note written in EPIC  PLAN OF CARE: Admit to inpatient   PATIENT DISPOSITION:  PACU - hemodynamically stable.   Delay start of Pharmacological VTE agent (>24hrs) due to surgical blood loss or risk of bleeding: no

## 2021-07-02 NOTE — Interval H&P Note (Signed)
History and Physical Interval Note:  07/02/2021 9:15 AM  Karen Winters  has presented today for surgery, with the diagnosis of OVARIAN CANCER.  The various methods of treatment have been discussed with the patient and family. After consideration of risks, benefits and other options for treatment, the patient has consented to  Procedure(s): HYSTERECTOMY ABDOMINAL WITH BILATERAL SALPINGO-OOPHORECTOMY (N/A) TUMOR DEBULKING (N/A) HERNIA REPAIR UMBILICAL ADULT (N/A) as a surgical intervention.  The patient's history has been reviewed, patient examined, no change in status, stable for surgery.  I have reviewed the patient's chart and labs.  Questions were answered to the patient's satisfaction.     Lafonda Mosses

## 2021-07-02 NOTE — Anesthesia Procedure Notes (Addendum)
Procedure Name: Intubation Date/Time: 07/02/2021 10:02 AM Performed by: Raenette Rover, CRNA Pre-anesthesia Checklist: Patient identified, Emergency Drugs available, Suction available, Patient being monitored and Timeout performed Patient Re-evaluated:Patient Re-evaluated prior to induction Oxygen Delivery Method: Circle system utilized Preoxygenation: Pre-oxygenation with 100% oxygen Induction Type: IV induction Ventilation: Mask ventilation without difficulty Laryngoscope Size: Mac and 3 Grade View: Grade I Tube type: Oral Tube size: 7.0 mm Number of attempts: 1 Airway Equipment and Method: Stylet and Oral airway Placement Confirmation: ETT inserted through vocal cords under direct vision, positive ETCO2, breath sounds checked- equal and bilateral and CO2 detector Secured at: 21 cm Tube secured with: Tape Dental Injury: Teeth and Oropharynx as per pre-operative assessment  Comments: Performed by Loura Pardon

## 2021-07-02 NOTE — Op Note (Signed)
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    Ascites: approximately 2L upon entry and produced during surgery  Urine 0000000 cc  Complications: None apparent   Operative Findings: Small mobile uterus, nodular masses filling the cul de sac. On intra-abdominal entry, approximately 1.6L of green-tinged ascites was encountered. Normal liver edge, diaphragm, and stomach. Infracolic omentum with a [id number] nodular area and several smaller (1-3cm) deposits c/w tumor. Otherwise, omentum without evidence of disease. Small fibrinous debris on the small bowel, no obvious tumor implants. Mesentery free of disease. No appreciable adenopathy. Bilateral ovaries replaced with vesicular tumors (L>R), measuring approximately 8cm and 12cm. Several areas of plaque (appeared more consistent with treated tumor than active cancer - right pelvic sidewall, posterior cul de sac, and anterior cul de sac) noted, most excised. Uterus 4-6cm in size. Dense adhesions of the bladder to the LUS and cervix, likely combination of prior c-section and treatment effect. R0 resection at the end of surgery if peritoneal plaques not c/w tumor. No clear umbilical hernia.   Procedure:   The patient was seen in the Holding Room. The risks, benefits,  complications, treatment options, and expected outcomes were discussed with the patient.  The patient concurred with the proposed plan, giving informed consent.   The patient was identified as Administrator and the procedure verified as hysterectomy, BSO with possible debulking. A Time Out was held and the above information confirmed upon entry to the operating room.  After induction of anesthesia, the patient was draped and prepped in the usual sterile manner.  She was prepped and draped in the normal sterile fashion in the dorsal lithotomy position in padded Allen stirrups with good attention paid to support of the lower back and lower extremities. Position was adjusted for appropriate support. A Foley catheter was placed to gravity.   A midline vertical incision was made and carried through the subcutaneous tissue to the fascia. The fascial incision was made and extended superiorally. The rectus muscles were separated. The peritoneum was identified and entered. Peritoneal incision was extended longitudinally.  The abdominal cavity was entered sharply and without incident. Ascites was drained and the abdomen and pelvis was explored.   A Bookwalter retractor was then placed. After packing the small bowel into the upper abdomen, we began the procedure by entering the  pelvic sidewall just posterior to the right round ligament. The pararectal space was developed and the retroperitoneum developed up to the level of the common iliac artery.  The course of the ureter was identified with ease. The right IP was then skeletonized, cauterized with bipolar electrocautery and cute. The utero-ovarian ligament was skeletonized, cauterized, and transected.   The entire right tube and ovary were handed off the field. The same procedure was then performed on the left.   Kelly clamps were placed on both uterine cornua. The round ligaments were identified and transected with the bovie. The anterior peritoneal reflection was  incised  and the bladder was dissected off the lower uterine segment. The retroperitoneal space was explored and the ureters were identified bilaterally. The uterine vessels were skeletonized bilaterally , then clamped, cut and suture ligated with 2-Vicryl suture. After the uterine arteries were taken, the bladder was backfilled to better delineate the bladder from the cervix given dense adhesions. Serial pedicles of the cardinal and utero-sacral ligaments were clamped, cut, and suture ligated with 0-Vicryl. Entrance was made into the vagina and the cervix amputated. The vaginal cuff angle were suture ligated with 0-Vicryl suture, incorporating the utero-sacral ligaments for support. The remainder of the vaginal cuff was then closed with figure-of-eight stitches using 0 Vicryl suture.     A small defect in the bladder peritoneum and muscle of the bladder was noted near the dome. This was oversewed with a running stitch of 2-0 Vicryl.  Multiple areas of plaque along the peritoneum were excised bluntly or with electrocautery and were sent with the uterus/cervix specimen.  The pelvis was copiously irrigated. Hemostasis was observed. The bowels were unpacked and attention was turned to the omentum. Using monopolar electrocautery, an omentectomy was started. The lesser sac was identified and free of disease. An infragastric omentectomy was performed using a Ligasure.  Hemostasis was assured.  The fascia was reapproximated with 0 looped PDS using a total of two sutures. The subcutaneous layer was then irrigated copiously.  The subcutaneous layer was reapproximated with interrupted 4-0 Vicryl. The subcutaneous layer was then reapproximated with a running 4-0 Monocryl. Dermabond was applied to the skin followed by a honeycomb dressing. The patient tolerated the procedure well.   Sponge, lap and needle counts were correct x 2.  Vagina was swabbed with minimal blood noted.  Jeral Pinch MD Gynecologic  Oncology

## 2021-07-02 NOTE — Anesthesia Procedure Notes (Deleted)
Date/Time: 07/02/2021 10:20 AM Performed by: Raenette Rover, CRNA

## 2021-07-03 ENCOUNTER — Encounter (HOSPITAL_COMMUNITY): Payer: Self-pay | Admitting: Gynecologic Oncology

## 2021-07-03 LAB — CBC
HCT: 40.2 % (ref 36.0–46.0)
Hemoglobin: 12.8 g/dL (ref 12.0–15.0)
MCH: 28.4 pg (ref 26.0–34.0)
MCHC: 31.8 g/dL (ref 30.0–36.0)
MCV: 89.3 fL (ref 80.0–100.0)
Platelets: 345 10*3/uL (ref 150–400)
RBC: 4.5 MIL/uL (ref 3.87–5.11)
RDW: 17.1 % — ABNORMAL HIGH (ref 11.5–15.5)
WBC: 12.7 10*3/uL — ABNORMAL HIGH (ref 4.0–10.5)
nRBC: 0 % (ref 0.0–0.2)

## 2021-07-03 LAB — BASIC METABOLIC PANEL
Anion gap: 8 (ref 5–15)
BUN: 7 mg/dL — ABNORMAL LOW (ref 8–23)
CO2: 25 mmol/L (ref 22–32)
Calcium: 9.2 mg/dL (ref 8.9–10.3)
Chloride: 106 mmol/L (ref 98–111)
Creatinine, Ser: 0.65 mg/dL (ref 0.44–1.00)
GFR, Estimated: >60 mL/min (ref >60)
Glucose, Bld: 135 mg/dL — ABNORMAL HIGH (ref 70–99)
Potassium: 4.8 mmol/L (ref 3.5–5.1)
Sodium: 139 mmol/L (ref 135–145)

## 2021-07-03 MED ORDER — CHLORHEXIDINE GLUCONATE CLOTH 2 % EX PADS
6.0000 | MEDICATED_PAD | Freq: Every day | CUTANEOUS | Status: DC
  Administered 2021-07-03 – 2021-07-04 (×2): 6 via TOPICAL

## 2021-07-03 NOTE — Plan of Care (Signed)

## 2021-07-03 NOTE — Progress Notes (Signed)
1 Day Post-Op Procedure(s) (LRB): HYSTERECTOMY ABDOMINAL WITH BILATERAL SALPINGO-OOPHORECTOMY; OMENTECTOMY (N/A) TUMOR DEBULKING (N/A)  Subjective: Patient reports incisional soreness with movement. No nausea or emesis reported. She has ambulated twice to the bathroom without difficulty. Ate scrambled eggs for breakfast. Incisional pain managed with prn pain medications. Denies passing flatus. Denies chest pain, dyspnea. Questions answered. No concerns voiced.    Objective: Vital signs in last 24 hours: Temp:  [97.4 F (36.3 C)-98.3 F (36.8 C)] 98 F (36.7 C) (08/03 0925) Pulse Rate:  [55-77] 55 (08/03 0925) Resp:  [15-20] 18 (08/03 0925) BP: (102-125)/(62-79) 121/70 (08/03 0925) SpO2:  [96 %-100 %] 98 % (08/03 0925) Last BM Date: 07/01/21  Intake/Output from previous day: 08/02 0701 - 08/03 0700 In: 3870 [P.O.:270; I.V.:3000; IV Piggyback:600] Out: 2800 [Urine:2550; Blood:250]  Physical Examination: General: alert, cooperative, and no distress Resp: clear to auscultation bilaterally Cardio: regular rate and rhythm, S1, S2 normal, no murmur, click, rub or gallop GI: soft, non-tender; bowel sounds normal; no masses,  no organomegaly and incision: op site dressing to the abdomen intact without drainage noted underneath Extremities: extremities normal, atraumatic, no cyanosis or edema  Labs: WBC/Hgb/Hct/Plts:  12.7/12.8/40.2/345 (08/03 0408) BUN/Cr/glu/ALT/AST/amyl/lip:  7/0.65/--/--/--/--/-- (08/03 0408)  Assessment: 70 y.o. s/p Procedure(s): HYSTERECTOMY ABDOMINAL WITH BILATERAL SALPINGO-OOPHORECTOMY; OMENTECTOMY TUMOR DEBULKING: stable Pain:  Pain is well-controlled on PRN medications.  Heme: Hgb 12.8 and Hct 40.2. Appropriate compared with pre-op values and surgical losses.  CV: BP and HR stable. Continue to monitor.  GI:  Tolerating po: Yes. Antiemetics ordered if needed.  GU: Voiding since foley removal. Adequate output recorded overnight.    FEN: No critical  values noted.  Prophylaxis: SCDs and lovenox ordered. Teaching kit at the bedside  Plan: Saline lock IV Abdominal binder  Encourage ambulation, IS use Continue plan of care per Dr. Delsa Sale and Dr. Berline Lopes Pt will be discharged home with lovenox. Teaching kit at bedside  The patient is to be discharged to home when ready.   LOS: 1 day    Dorothyann Gibbs 07/03/2021, 10:01 AM

## 2021-07-04 NOTE — Discharge Instructions (Signed)
07/04/2021  Return to work: 4-6 weeks if applicable  You will have a white honeycomb dressing over your larger incision. This dressing can be removed 5 days after surgery and you do not need to reapply a new dressing. Once you remove the dressing, you will notice that you have the surgical glue (dermabond) on the incision and this will peel off on its own. You can get this dressing wet in the shower the days after surgery prior to removal on the 5th day.   You will need to give yourself once daily lovenox injections for the next 28 days.  Activity: 1. Be up and out of the bed during the day.  Take a nap if needed.  You may walk up steps but be careful and use the hand rail.  Stair climbing will tire you more than you think, you may need to stop part way and rest.   2. No lifting or straining over 10 lbs, pushing, pulling, straining for 6 weeks.  3. No driving for around [id number] days.  Do not drive if you are taking narcotic pain medicine. You need to make sure your reaction time has returned and you can brake safely.  4. Shower daily.  Use your regular soap to bathe and when finished pat your incision dry; don't rub.  No tub baths until cleared by your surgeon.   5. No sexual activity and nothing in the vagina for 8 weeks.  6. You may experience a small amount of clear drainage from your incision, which is normal.  If the drainage persists or increases, please call the office.  7. You may experience vaginal spotting after surgery or around the 6-8 week mark from surgery when the stitches at the top of the vagina begin to dissolve.  The spotting is normal but if you experience heavy bleeding, call our office.  8. Take Tylenol or ibuprofen first for pain and only use narcotic pain medication for severe pain not relieved by the Tylenol or Ibuprofen.  Monitor your Tylenol intake to a max of 4,000 mg.   Diet: 1. Low sodium Heart Healthy Diet is recommended.  2. It is safe to use a laxative, such as  Miralax or Colace, if you have difficulty moving your bowels. You can take Sennakot at bedtime every evening to keep bowel movements regular and to prevent constipation.    Wound Care: 1. Keep clean and dry.  Shower daily.  Reasons to call the Doctor: Fever - Oral temperature greater than 100.4 degrees Fahrenheit Foul-smelling vaginal discharge Difficulty urinating Nausea and vomiting Increased pain at the site of the incision that is unrelieved with pain medicine. Difficulty breathing with or without chest pain New calf pain especially if only on one side Sudden, continuing increased vaginal bleeding with or without clots.   Contacts: For questions or concerns you should contact:  Dr. Jeral Pinch at [phone]  Joylene John, NP at [phone]  After Hours: call [phone] and have the GYN Oncologist paged/contacted

## 2021-07-04 NOTE — Progress Notes (Signed)
2 Days Post-Op Procedure(s) (LRB): HYSTERECTOMY ABDOMINAL WITH BILATERAL SALPINGO-OOPHORECTOMY; OMENTECTOMY (N/A) TUMOR DEBULKING (N/A)  Subjective: Patient reports improvement in incisional soreness with movement. No nausea or emesis reported. She has ambulated in the halls four times yesterday without difficulty. Planning on eating pancakes for breakfast. Incisional pain managed with prn pain medications. Denies passing flatus or having a bowel movement. Denies chest pain, dyspnea. Questions answered. No concerns voiced. Husband at the bedside.  Objective: Vital signs in last 24 hours: Temp:  [97.5 F (36.4 C)-98 F (36.7 C)] 98 F (36.7 C) (08/04 0642) Pulse Rate:  [64-75] 65 (08/04 0642) Resp:  [15-18] 18 (08/04 0642) BP: (96-107)/(65-74) 107/74 (08/04 6516) SpO2:  [97 %-99 %] 99 % (08/04 0642) Last BM Date: 07/01/21  Intake/Output from previous day: 08/03 0701 - 08/04 0700 In: 720 [P.O.:720] Out: 1100 [Urine:1100]  Physical Examination: General: alert, cooperative, and no distress Resp: clear to auscultation bilaterally Cardio: regular rate and rhythm, S1, S2 normal, no murmur, click, rub or gallop GI: incision: op site dressing to the abdomen intact without drainage noted underneath and abdomen soft, non-distended, hypoactive bowel sounds Extremities: extremities normal, atraumatic, no cyanosis or edema  Assessment: 70 y.o. s/p Procedure(s): HYSTERECTOMY ABDOMINAL WITH BILATERAL SALPINGO-OOPHORECTOMY; OMENTECTOMY TUMOR DEBULKING: stable Pain:  Pain is well-controlled on PRN medications.  Heme: Hgb 12.8 and Hct 40.2 on 07/03/2021. Appropriate compared with pre-op values and surgical losses.  CV: BP and HR stable. Continue to monitor.  GI:  Tolerating po: Yes. Antiemetics ordered if needed.  GU: Voiding since foley removal. Adequate output recorded overnight.    FEN: No critical values noted.  Prophylaxis: SCDs and lovenox ordered. Teaching kit at the  bedside  Plan: Continue plan of care per Dr. Berline Lopes  Abdominal binder in place  Encourage ambulation, IS use Pt will be discharged home with lovenox. Teaching kit at bedside.  The patient is to be discharged to home when ready.   LOS: 2 days    Dorothyann Gibbs 07/04/2021, 9:27 AM

## 2021-07-04 NOTE — Progress Notes (Signed)
Patient was given discharge instructions by Cristy, and all questions were answered. Patient was stable for discharge and was taken to the main exit by wheelchair.

## 2021-07-04 NOTE — Discharge Summary (Signed)
Physician Discharge Summary  Patient ID: Karen Winters MRN: [id number] DOB/AGE: Mar 05, 1951 70 y.o.  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    Hospital Course: The patient presented on 8/2 and underwent exploratory laparotomy with tumor debulking in the setting of presumed ovarian cancer.  Surgery was uncomplicated with estimated blood loss of 250 cc.  Patient did very well from a postoperative standpoint.  By postoperative day #2, she was tolerating p.o. intake, voiding without difficulty, ambulating, and pain was well controlled.  She was not yet endorsing flatus or bowel movement but felt lots of rumbling in her lower abdomen and desired discharge.  We reviewed in detail signs that would be concerning for a postoperative ileus, which I think is unlikely in the setting of her surgery.  Consults: None  Significant Diagnostic Studies: A.m. labs after surgery  Treatments: Surgery which included exploratory laparotomy, total hysterectomy, bilateral salpingo-oophorectomy, tumor debulking including omentectomy and excision of several tumor plaques versus treated disease, oversew of bladder peritoneum  Discharge Exam: Blood pressure 108/81, pulse 69, temperature 97.7 F (36.5 C), resp. rate 16, height '5\' 5"'$  (1.651 m), weight 125 lb (56.7 kg), last menstrual period 10/31/2008, SpO2 99 %. General: alert, cooperative, and no distress Resp: clear to auscultation bilaterally Cardio: regular rate and rhythm, S1, S2 normal, no murmur, click, rub or gallop GI: soft, non-tender; bowel sounds normal; no masses,  no organomegaly and incision: op site dressing to the abdomen intact without drainage noted  underneath Extremities: extremities normal, atraumatic, no cyanosis or edema  Disposition: Discharge disposition: 01-Home or Self Care       Discharge Instructions     Discharge wound care:   Complete by: As directed    Take honeycomb dressing off 5 days after surgery   Increase activity slowly   Complete by: As directed       Allergies as of 07/04/2021       Reactions   Bee Venom Swelling   Latex Other (See Comments)   Redness, itching   Mushroom Extract Complex Nausea And Vomiting   Diarrhea   Penicillins Hives   Reaction:  70 years old   Tape Itching   Redness        Medication List     TAKE these medications    acetaminophen 500 MG tablet Commonly known as: TYLENOL Take 500 mg by mouth every 6 (six) hours as needed for moderate pain or headache.   ADULT GUMMY PO Take 2 capsules by mouth daily.   enoxaparin 40 MG/0.4ML injection Commonly known as: LOVENOX Inject 0.4 mLs (40 mg total) into the skin daily for 28 days. For AFTER surgery only Start taking on: July 05, 2021   EPINEPHrine 0.3 mg/0.3 mL Soaj injection Commonly known as: EPI-PEN Inject 0.3 mg as directed as needed (allergic reaction).   ibuprofen 600 MG tablet Commonly known as: ADVIL Take 1 tablet (600 mg total) by mouth every 6 (six) hours as needed for moderate pain. For AFTER surgery What changed: Another medication with the same name was removed. Continue taking this medication, and follow the directions you see here.   polyethylene glycol 17 g packet Commonly known as: MIRALAX / GLYCOLAX Take 17 g by mouth daily.   senna  8.6 MG Tabs tablet Commonly known as: SENOKOT Take 17.2 mg by mouth daily.   senna-docusate 8.6-50 MG tablet Commonly known as: Senokot-S Take 2 tablets by mouth at bedtime. For AFTER surgery, do not take if having diarrhea   traMADol 50 MG tablet Commonly known as: ULTRAM Take 1 tablet (50 mg total) by mouth every 6 (six) hours as needed for severe pain.  For AFTER surgery only, do not take and drive       ASK your doctor about these medications    dexamethasone 4 MG tablet Commonly known as: DECADRON Take 2 tabs at the night before and 2 tab the morning of chemotherapy, every 3 weeks, by mouth x 6 cycles   ondansetron 8 MG tablet Commonly known as: Zofran Take 1 tablet (8 mg total) by mouth 2 (two) times daily as needed for refractory nausea / vomiting. Start on day 3 after carboplatin chemo.   prochlorperazine 10 MG tablet Commonly known as: COMPAZINE Take 1 tablet (10 mg total) by mouth every 6 (six) hours as needed (Nausea or vomiting).   SUMAtriptan 100 MG tablet Commonly known as: IMITREX 1 tab with headache onset.  Can repeat in 2 hours.  Max dosage '200mg'$ /24 hours.               Discharge Care Instructions  (From admission, onward)           Start     Ordered   07/04/21 0000  Discharge wound care:       Comments: Take honeycomb dressing off 5 days after surgery   07/04/21 1530            Follow-up Information     Lafonda Mosses, MD Follow up on 07/08/2021.   Specialty: Gynecologic Oncology Why: at 3:30pm will be a PHONE visit to discuss pathology. IN PERSON visit will be at the [place] on 07/17/2021 at 1:30 pm. Contact information: [address] [place] [address] [phone]                 Greater than thirty minutes were spend for face to face discharge instructions and discharge orders/summary in EPIC.   Signed: Lafonda Mosses 07/04/2021, 3:31 PM

## 2021-07-04 NOTE — Progress Notes (Signed)
Patient's spouse was education on administration of lovenox shot and administered the shot. All questions answered.

## 2021-07-05 ENCOUNTER — Telehealth: Payer: Self-pay

## 2021-07-05 NOTE — Telephone Encounter (Signed)
Spoke with Karen Winters this morning. She states she is eating, drinking and urinating well. She has not had a BM and has not  passed gas.No N/V.  She increased the senokot-s to 2 tabs bid today. She will add a 1/2 capful of Miralax at lunch today.  If not a BM by this afternoon, she will take a capful of Miralax tonight and continue with BID dosing until BM. Encouraged her to drink plenty of water. She denies fever or chills. Incision is dry and intact. She will remove the honeycomb dressing on 07-07-21. She does not need to apply a new dressing. Her pain is controlled with Ibuprofen and tylenol.  Instructed to call office with any fever, chills, purulent drainage, uncontrolled pain or any other questions or concerns. Patient verbalizes understanding.  Pt aware of post op appointments as well as the office number [phone] and after hours number [phone] to call if she has any questions or concerns

## 2021-07-08 ENCOUNTER — Ambulatory Visit: Payer: Medicare HMO | Admitting: Gynecologic Oncology

## 2021-07-08 ENCOUNTER — Telehealth: Payer: Self-pay | Admitting: Oncology

## 2021-07-08 NOTE — Telephone Encounter (Signed)
Karen Winters mentioned that she has not had a bowel movement since surgery but is passing gas. She is taking Senokot twice a day and miralax 1/2 capful at lunch and at bedtime.  Advised her to take the miralax/senokot tonight and if no bowel movement by tomorrow to try 1/2 bottle of magnesium citrate over ice.  Advised her to call if she still has not had a bm.  She verbalized agreement.

## 2021-07-09 ENCOUNTER — Inpatient Hospital Stay: Payer: Medicare HMO | Attending: Hematology and Oncology | Admitting: Gynecologic Oncology

## 2021-07-09 ENCOUNTER — Other Ambulatory Visit: Payer: Self-pay

## 2021-07-09 ENCOUNTER — Encounter: Payer: Self-pay | Admitting: Gynecologic Oncology

## 2021-07-09 ENCOUNTER — Telehealth: Payer: Self-pay

## 2021-07-09 DIAGNOSIS — Z90722 Acquired absence of ovaries, bilateral: Secondary | ICD-10-CM | POA: Diagnosis not present

## 2021-07-09 DIAGNOSIS — D3910 Neoplasm of uncertain behavior of unspecified ovary: Secondary | ICD-10-CM

## 2021-07-09 DIAGNOSIS — Z90710 Acquired absence of both cervix and uterus: Secondary | ICD-10-CM | POA: Diagnosis not present

## 2021-07-09 DIAGNOSIS — C569 Malignant neoplasm of unspecified ovary: Secondary | ICD-10-CM

## 2021-07-09 DIAGNOSIS — Z7189 Other specified counseling: Secondary | ICD-10-CM

## 2021-07-09 LAB — SURGICAL PATHOLOGY

## 2021-07-09 NOTE — Progress Notes (Signed)
Gynecologic Oncology Telehealth Consult Note: Gyn-Onc  I connected with Karen Winters on 07/09/21 at  3:15 PM EDT by telephone and verified that I am speaking with the correct person using two identifiers.  I discussed the limitations, risks, security and privacy concerns of performing an evaluation and management service by telemedicine and the availability of in-person appointments. I also discussed with the patient that there may be a patient responsible charge related to this service. The patient expressed understanding and agreed to proceed.  Other persons participating in the visit and their role in the encounter: Patient's husband, close friend.  Patient's location: Home Provider's location: [facility]  Reason for Visit: Follow-up after surgery, pathology discussion  Treatment History: Oncology History  Ovarian cancer ([place])  04/01/2021 Initial Diagnosis   She notes that she noticed an umbilical hernia.  She was scheduled to see a surgeon about possible repair.  She also endorses intermittent pain in around the area of her right hip that she thought was secondary to her exercise routine.  Over the last week, she has had a decreased appetite.  She denies any nausea or emesis but endorses several months of abdominal bloating and early satiety.  She notes normal bowel function, which she describes as bowel movements at least daily.  She has had some increased urinary frequency and around [place] developed stress urinary incontinence when she runs.     04/04/2021 Imaging   US pelvis  Septate uterus, otherwise unremarkable.   Normal endometrial complex.   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   Recommend further assessment by CT imaging with IV and oral contrast.   04/05/2021 Tumor Marker   Patient's tumor was tested for the following markers: CA-125 Results of the  tumor marker test revealed 826.   04/05/2021 Imaging   1. Large bilateral solid-appearing adnexal masses are identified concerning for ovarian neoplasm. There is soft tissue infiltration into the surrounding peritoneal cavity which partially encases nonobstructed loops of small bowel with probable serosal involvement. 2. Moderate volume of ascites identified within the abdomen and pelvis likely secondary to peritoneal carcinomatosis. Diagnostic paracentesis may be helpful for further workup. 3. Mild increased caliber of small bowel loops with a few air-fluid levels. However, there are no signs to suggest a bowel obstruction as enteric contrast material is noted up to the level of the hepatic flexure.   04/10/2021 Initial Diagnosis   Ovarian cancer ([place])   04/10/2021 Procedure   Successful ultrasound-guided paracentesis yielding 1.4 liters of peritoneal fluid.   04/11/2021 Cancer Staging   Staging form: Ovary, Fallopian Tube, and Primary Peritoneal Carcinoma, AJCC 8th Edition - Clinical stage from 04/11/2021: Stage IIIC (cT3c, cN0, cM0) - Signed by Heath Lark, MD on 04/11/2021  Stage prefix: Initial diagnosis    04/18/2021 Tumor Marker   Patient's tumor was tested for the following markers: CA-125. Results of the tumor marker test revealed [id number]   04/22/2021 -  Chemotherapy    Patient is on Treatment Plan: OVARIAN CARBOPLATIN (AUC 6) / PACLITAXEL (175) Q21D X 6 CYCLES       05/14/2021 Tumor Marker   Patient's tumor was tested for the following markers: CA-125. Results of the tumor marker test revealed 877.   06/10/2021 Imaging   1. Today's study demonstrates a centrally stable disease when compared to prior examination from 04/05/2021, with large bilateral ovarian lesions and moderate volume of malignant ascites, as detailed  above. 2. Trace right pleural effusion lying dependently.     07/02/2021 Surgery     Preoperative Diagnosis: Malignancy presumed to be of gyn origin based on IHC, lack  of clinical response to NACT   Postoperative Diagnosis: Stage IIIC presumed ovarian cancer    Procedure(s) Performed: Exploratory laparotomy with total hysterectomy bilateral salpingo-oophorectomy, total abdominal hysterectomy, tumor debulking including omentectomy and excision of several tumor plaques versus treated disease, oversew of bladder peritoneum   Surgeon: Valarie Cones, MD    Specimens: Uterus Cervix, Bilateral tubes / ovaries, peritoneal plaques (sent with uterus and cervix), omentum   Estimated Blood Loss: 250 mL.     Ascites: approximately 2L upon entry and produced during surgery Operative Findings: Small mobile uterus, nodular masses filling the cul de sac. On intra-abdominal entry, approximately 1.6L of green-tinged ascites was encountered. Normal liver edge, diaphragm, and stomach. Infracolic omentum with a [id number] nodular area and several smaller (1-3cm) deposits c/w tumor. Otherwise, omentum without evidence of disease. Small fibrinous debris on the small bowel, no obvious tumor implants. Mesentery free of disease. No appreciable adenopathy. Bilateral ovaries replaced with vesicular tumors (L>R), measuring approximately 8cm and 12cm. Several areas of plaque (appeared more consistent with treated tumor than active cancer - right pelvic sidewall, posterior cul de sac, and anterior cul de sac) noted, most excised. Uterus 4-6cm in size. Dense adhesions of the bladder to the LUS and cervix, likely combination of prior c-section and treatment effect. R0 resection at the end of surgery if peritoneal plaques not c/w tumor. No clear umbilical hernia.    07/02/2021 Pathology Results   A. 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   - Surface involvement present.  - Fallopian tube involvement present, non-invasive.   B. OVARY AND FALLOPIAN TUBE, LEFT, SALPINGO OOPHORECTOMY:  - Serous borderline tumor.  -  Surface involvement present, non-invasive.  - Fallopian tube involvement present, non-invasive.   C. UTERUS AND CERVIX, HYSTERECTOMY:  - Uterus:       Endometrium: Benign endometrial type polyp. Inactive endometrium.  No hyperplasia or malignancy.       Myometrium: Unremarkable. No malignancy.       Serosa: Invasive serous implant.  - Cervix: Benign squamous and endocervical mucosa. No dysplasia or  malignancy.  - Bilateral fallopian tubes: Surface involvement present.   D. OMENTUM:  - Non-invasive serous implant.  - One of one lymph nodes negative for carcinoma (0/1).   ONCOLOGY TABLE:   OVARY or FALLOPIAN TUBE or PRIMARY PERITONEUM: Resection   Procedure: Hysterectomy with bilateral salpingo-oophorectomy and omental  resection.  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  Other Tissue/ Organ Involvement: Not applicable  Largest Extrapelvic Peritoneal Focus: 2 mm  Peritoneal/Ascitic Fluid Involvement: Not applicable  Chemotherapy Response Score (CRS): Cannot be determined  Regional Lymph Nodes:       Number of Nodes with Metastasis Greater than 10 mm: 0       Number of Nodes with Metastasis 10 mm or Less (excludes isolated  tumor cells): 0       Number of Nodes with Isolated Tumor Cells (  0.2 mm or less): 0       Number of Lymph Nodes Examined: 1  Distant Metastasis:       Distant Site(s) Involved: Not applicable  Pathologic Stage Classification (pTNM, AJCC 8th Edition): ypT3a, ypN0  Ancillary Studies: Can be performed upon request  Representative Tumor Block: A7   Comment(s): Given extensive involvement and neoadjuvant therapy it is  difficult to determine the exact primary location, but the right ovary  is slightly favored. Dr. Saralyn Pilar has reviewed the case.      Interval History: The patient reports doing well at home.  She notes pain has improved since discharge.  She is tolerating p.o. intake although eating less than she had been.  She is focusing on getting plenty of fiber and is ready to start increasing her protein intake again.  She reports voiding freely, has had flatus and finally had bowel function last night with more loose stools.  She is taking a number of medications to help with her constipation.  Past Medical/Surgical History: Past Medical History:  Diagnosis Date   Anemia    Bronchitis    Cancer ([place])    Complication of anesthesia    Endometriosis    Family history of renal cancer 05/09/2021   History of anemia    Migraines    Osteoporosis    PONV (postoperative nausea and vomiting)     Past Surgical History:  Procedure Laterality Date   [place]   and BTSP   COLONOSCOPY     DEBULKING N/A 07/02/2021   Procedure: TUMOR DEBULKING;  Surgeon: Lafonda Mosses, MD;  Location: [facility];  Service: Gynecology;  Laterality: N/A;   HYSTERECTOMY ABDOMINAL WITH SALPINGO-OOPHORECTOMY N/A 07/02/2021   Procedure: HYSTERECTOMY ABDOMINAL WITH BILATERAL SALPINGO-OOPHORECTOMY; OMENTECTOMY;  Surgeon: Lafonda Mosses, MD;  Location: [facility];  Service: Gynecology;  Laterality: N/A;   LAPAROSCOPY     for infertility-endometriosis   LAPAROSCOPY     2nd for infertility   mole removed     precancerous mole right upper abd   ORIF WRIST FRACTURE Right 08/02/2020   Procedure: Right distal radius open reduction internal fixation;  Surgeon: Verner Mould, MD;  Location: [facility];  Service: Orthopedics;  Laterality: Right;  85mn   TONSILLECTOMY AND ADENOIDECTOMY      Family History  Problem Relation Age of Onset    Renal cancer Father 550      Agent Orange exposure   Hypertension Sister    Hypothyroidism Sister    Other Sister        Wegener's syndrome   Hypertension Brother    Diabetes Maternal Grandmother    Heart attack Paternal Grandfather     Social History   Socioeconomic History   Marital status: Married    Spouse name: Phil   Number of children: 2   Years of education: Not on file   Highest education level: Not on file  Occupational History   Occupation: retired  Tobacco Use   Smoking status: Former    Types: Cigarettes    Quit date: 12/02/1983    Years since quitting: 37.6   Smokeless tobacco: Never   Tobacco comments:    quit 1985  Vaping Use   Vaping Use: Never used  Substance and Sexual Activity   Alcohol use: Not Currently    Comment: ocassional   Drug use: No   Sexual activity: Yes    Partners: Male    Birth control/protection: Surgical, Post-menopausal  Comment: BTL  Other Topics Concern   Not on file  Social History Narrative   Not on file   Social Determinants of Health   Financial Resource Strain: Not on file  Food Insecurity: Not on file  Transportation Needs: Not on file  Physical Activity: Not on file  Stress: Not on file  Social Connections: Not on file    Current Medications:  Current Outpatient Medications:    acetaminophen (TYLENOL) 500 MG tablet, Take 500 mg by mouth every 6 (six) hours as needed for moderate pain or headache., Disp: , Rfl:    dexamethasone (DECADRON) 4 MG tablet, Take 2 tabs at the night before and 2 tab the morning of chemotherapy, every 3 weeks, by mouth x 6 cycles (Patient taking differently: Take 8 mg by mouth See admin instructions. Take 8 mg at the night before and 8 mg the morning of chemotherapy, every 3 weeks, by mouth x 6 cycles), Disp: 36 tablet, Rfl: 6   enoxaparin (LOVENOX) 40 MG/0.4ML injection, Inject 0.4 mLs (40 mg total) into the skin daily for 28 days. For AFTER surgery only, Disp: 11.2 mL, Rfl: 0    EPINEPHrine 0.3 mg/0.3 mL IJ SOAJ injection, Inject 0.3 mg as directed as needed (allergic reaction). , Disp: , Rfl:    ibuprofen (ADVIL) 600 MG tablet, Take 1 tablet (600 mg total) by mouth every 6 (six) hours as needed for moderate pain. For AFTER surgery, Disp: 30 tablet, Rfl: 0   Multiple Vitamins-Minerals (ADULT GUMMY PO), Take 2 capsules by mouth daily., Disp: , Rfl:    ondansetron (ZOFRAN) 8 MG tablet, Take 1 tablet (8 mg total) by mouth 2 (two) times daily as needed for refractory nausea / vomiting. Start on day 3 after carboplatin chemo. (Patient not taking: No sig reported), Disp: 30 tablet, Rfl: 1   polyethylene glycol (MIRALAX / GLYCOLAX) 17 g packet, Take 17 g by mouth daily., Disp: , Rfl:    prochlorperazine (COMPAZINE) 10 MG tablet, Take 1 tablet (10 mg total) by mouth every 6 (six) hours as needed (Nausea or vomiting). (Patient not taking: No sig reported), Disp: 30 tablet, Rfl: 1   senna (SENOKOT) 8.6 MG TABS tablet, Take 17.2 mg by mouth daily., Disp: , Rfl:    senna-docusate (SENOKOT-S) 8.6-50 MG tablet, Take 2 tablets by mouth at bedtime. For AFTER surgery, do not take if having diarrhea, Disp: 30 tablet, Rfl: 0   SUMAtriptan (IMITREX) 100 MG tablet, 1 tab with headache onset.  Can repeat in 2 hours.  Max dosage '200mg'$ /24 hours. (Patient taking differently: Take 100 mg by mouth every 2 (two) hours as needed for migraine. Can repeat in 2 hours.  Max dosage '200mg'$ /24 hours.), Disp: 27 tablet, Rfl: 4   traMADol (ULTRAM) 50 MG tablet, Take 1 tablet (50 mg total) by mouth every 6 (six) hours as needed for severe pain. For AFTER surgery only, do not take and drive, Disp: 10 tablet, Rfl: 0  Review of Symptoms: Complete 10-system review is negative except as above in Interval History.  Physical Exam: LMP 10/31/2008  Deferred given limitations of phone visit.  Laboratory & Radiologic Studies: Pathology results from surgery and treatment history above  Assessment & Plan: Karen Winters is a 70 y.o. 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  Patient is doing well from  a postoperative standpoint and meeting milestones.  We discussed continued efforts with regards to her constipation.  We also reviewed postoperative expectations and restrictions.  I reviewed her pathology in detail.  I am very happy with this news as is the patient and her husband.  We discussed that borderline tumor does not require adjuvant therapy but given low-grade serous carcinoma, I have spoken with Dr. Alvy Bimler, we recommend hormonal treatment for adjuvant therapy.  We will discuss with pathology whether there is evidence of surface involvement of the ovary by low-grade tumor.  If confined to the ovary, I would still favor hormonal therapy given that there may have been other foci of low-grade carcinoma that were treated during her neoadjuvant chemotherapy.  I discussed the assessment and treatment plan with the patient. The patient was provided with an opportunity to ask questions and all were answered. The patient agreed with the plan and demonstrated an understanding of the instructions.   The patient was advised to call back or see an in-person evaluation if the symptoms worsen or if the condition fails to improve as anticipated.   28 minutes of total time was spent for this patient encounter, including preparation, face-to-face counseling with the patient and coordination of care, and documentation of the encounter.   Jeral Pinch, MD  Division of Gynecologic Oncology  Department of Obstetrics and Gynecology  [facility]

## 2021-07-09 NOTE — Telephone Encounter (Signed)
Spoke with Karen Winters.  She had a BM at 0200. She has had some loose stool since. Told her to resume the 2 senokot-s bid tomorrow morning. Told her that she needed to have a good BM every 3 days. She needs to juggle the senokot-s and miralax pending her bowel status. She can use dulcolax tabs prn if no BM with other meds in 3 days since Mag-Citrate has been recalled and not available. Pt verbalized understanding.

## 2021-07-11 ENCOUNTER — Other Ambulatory Visit: Payer: Self-pay | Admitting: Hematology and Oncology

## 2021-07-11 DIAGNOSIS — C563 Malignant neoplasm of bilateral ovaries: Secondary | ICD-10-CM

## 2021-07-12 ENCOUNTER — Telehealth: Payer: Self-pay | Admitting: Oncology

## 2021-07-12 NOTE — Telephone Encounter (Signed)
Karen Winters called and said she noticed a 3 inch purple bruise over her left hip bone this morning.  She denies falling and said it is near a lovenox injection site.  She doesn't feel any bumps or hard areas under the bruise.  She has walked a mile already this morning.    Advised her that the bruise is most likely from the Lovenox injection.  I will let Joylene John, NP know and call her back if she has any recommendations.

## 2021-07-15 ENCOUNTER — Other Ambulatory Visit: Payer: Self-pay | Admitting: Gynecologic Oncology

## 2021-07-15 NOTE — Progress Notes (Signed)
Gynecologic Oncology Multi-Disciplinary Disposition Conference Note  Date of the Conference: July 15, 2021  Patient Name: Karen Winters  Referring Provider: Dr. Edwinna Areola Primary GYN Oncologist: Dr. 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 Disposition is to maintenance hormonal therapy. HRD testing pending.   This Multidisciplinary conference took place involving physicians from [place], [place], Radiation Oncology, Pathology, Radiology along with the Gynecologic Oncology Nurse Practitioner and RN.  Comprehensive assessment of the patient's malignancy, staging, need for surgery, chemotherapy, radiation therapy, and need for further testing were reviewed. Supportive measures, both inpatient and following discharge were also discussed. The recommended plan of care is documented. Greater than 35 minutes were spent correlating and coordinating this patient's care.

## 2021-07-17 ENCOUNTER — Inpatient Hospital Stay: Payer: Medicare HMO | Attending: Gynecologic Oncology

## 2021-07-17 ENCOUNTER — Other Ambulatory Visit: Payer: Self-pay

## 2021-07-17 ENCOUNTER — Inpatient Hospital Stay (HOSPITAL_BASED_OUTPATIENT_CLINIC_OR_DEPARTMENT_OTHER): Payer: Medicare HMO | Admitting: Gynecologic Oncology

## 2021-07-17 VITALS — BP 133/99 | HR 78 | Temp 97.6°F | Resp 18 | Ht 65.0 in | Wt 118.2 lb

## 2021-07-17 DIAGNOSIS — R180 Malignant ascites: Secondary | ICD-10-CM

## 2021-07-17 DIAGNOSIS — Z90722 Acquired absence of ovaries, bilateral: Secondary | ICD-10-CM | POA: Diagnosis not present

## 2021-07-17 DIAGNOSIS — Z9079 Acquired absence of other genital organ(s): Secondary | ICD-10-CM | POA: Diagnosis not present

## 2021-07-17 DIAGNOSIS — C563 Malignant neoplasm of bilateral ovaries: Secondary | ICD-10-CM

## 2021-07-17 DIAGNOSIS — C569 Malignant neoplasm of unspecified ovary: Secondary | ICD-10-CM

## 2021-07-17 DIAGNOSIS — Z90710 Acquired absence of both cervix and uterus: Secondary | ICD-10-CM

## 2021-07-17 DIAGNOSIS — C561 Malignant neoplasm of right ovary: Secondary | ICD-10-CM

## 2021-07-17 DIAGNOSIS — D3910 Neoplasm of uncertain behavior of unspecified ovary: Secondary | ICD-10-CM

## 2021-07-17 LAB — CMP (CANCER CENTER ONLY)
ALT: 20 U/L (ref 0–44)
AST: 17 U/L (ref 15–41)
Albumin: 3.9 g/dL (ref 3.5–5.0)
Alkaline Phosphatase: 100 U/L (ref 38–126)
Anion gap: 11 (ref 5–15)
BUN: 17 mg/dL (ref 8–23)
CO2: 25 mmol/L (ref 22–32)
Calcium: 9.8 mg/dL (ref 8.9–10.3)
Chloride: 104 mmol/L (ref 98–111)
Creatinine: 0.77 mg/dL (ref 0.44–1.00)
GFR, Estimated: >60 mL/min (ref >60)
Glucose, Bld: 89 mg/dL (ref 70–99)
Potassium: 4.1 mmol/L (ref 3.5–5.1)
Sodium: 140 mmol/L (ref 135–145)
Total Bilirubin: 0.3 mg/dL (ref 0.3–1.2)
Total Protein: 7.7 g/dL (ref 6.5–8.1)

## 2021-07-17 LAB — CBC WITH DIFFERENTIAL (CANCER CENTER ONLY)
Abs Immature Granulocytes: 0.03 10*3/uL (ref 0.00–0.07)
Basophils Absolute: 0 10*3/uL (ref 0.0–0.1)
Basophils Relative: 1 %
Eosinophils Absolute: 0.2 10*3/uL (ref 0.0–0.5)
Eosinophils Relative: 2 %
HCT: 40.4 % (ref 36.0–46.0)
Hemoglobin: 12.9 g/dL (ref 12.0–15.0)
Immature Granulocytes: 0 %
Lymphocytes Relative: 35 %
Lymphs Abs: 2.8 10*3/uL (ref 0.7–4.0)
MCH: 28.7 pg (ref 26.0–34.0)
MCHC: 31.9 g/dL (ref 30.0–36.0)
MCV: 89.8 fL (ref 80.0–100.0)
Monocytes Absolute: 0.5 10*3/uL (ref 0.1–1.0)
Monocytes Relative: 6 %
Neutro Abs: 4.4 10*3/uL (ref 1.7–7.7)
Neutrophils Relative %: 56 %
Platelet Count: 458 10*3/uL — ABNORMAL HIGH (ref 150–400)
RBC: 4.5 MIL/uL (ref 3.87–5.11)
RDW: 17 % — ABNORMAL HIGH (ref 11.5–15.5)
WBC Count: 8 10*3/uL (ref 4.0–10.5)
nRBC: 0 % (ref 0.0–0.2)

## 2021-07-17 MED ORDER — LETROZOLE 2.5 MG PO TABS: 2.5000 mg | ORAL_TABLET | Freq: Every day | ORAL | 6 refills | Status: DC

## 2021-07-17 NOTE — Patient Instructions (Signed)
Was great to see you!  You are healing very well from surgery.  I will plan to see you in 3 months.  I will release your CA-125 results to you in my chart.  I have sent in a prescription for the letrozole.  Please keep me posted over the next few weeks if you have any significant side effects.  We will plan to do visits every 3 months initially.  If you have any new symptoms like pelvic or abdominal pain, bloating, change to your bowel function, please call to see me sooner.

## 2021-07-17 NOTE — Progress Notes (Signed)
Gynecologic Oncology Return Clinic Visit  07/17/21  Reason for Visit: Postoperative follow-up, pathology review, treatment discussion  Treatment History: Oncology History Overview Note  Borderline serous carcinoma   Ovarian cancer ([place])  04/01/2021 Initial Diagnosis   She notes that she noticed an umbilical hernia.  She was scheduled to see a surgeon about possible repair.  She also endorses intermittent pain in around the area of her right hip that she thought was secondary to her exercise routine.  Over the last week, she has had a decreased appetite.  She denies any nausea or emesis but endorses several months of abdominal bloating and early satiety.  She notes normal bowel function, which she describes as bowel movements at least daily.  She has had some increased urinary frequency and around [place] developed stress urinary incontinence when she runs.     04/04/2021 Imaging   US pelvis  Septate uterus, otherwise unremarkable.   Normal endometrial complex.   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   Recommend further assessment by CT imaging with IV and oral contrast.   04/05/2021 Tumor Marker   Patient's tumor was tested for the following markers: CA-125 Results of the tumor marker test revealed 826.   04/05/2021 Imaging   1. Large bilateral solid-appearing adnexal masses are identified concerning for ovarian neoplasm. There is soft tissue infiltration into the surrounding peritoneal cavity which partially encases nonobstructed loops of small bowel with probable serosal involvement. 2. Moderate volume of ascites identified within the abdomen and pelvis likely secondary to peritoneal carcinomatosis. Diagnostic paracentesis may be helpful for further workup. 3. Mild increased caliber of small bowel loops with a few air-fluid levels. However, there are no signs to suggest a bowel  obstruction as enteric contrast material is noted up to the level of the hepatic flexure.   04/10/2021 Initial Diagnosis   Ovarian cancer ([place])   04/10/2021 Procedure   Successful ultrasound-guided paracentesis yielding 1.4 liters of peritoneal fluid.   04/11/2021 Cancer Staging   Staging form: Ovary, Fallopian Tube, and Primary Peritoneal Carcinoma, AJCC 8th Edition - Clinical stage from 04/11/2021: Stage IIIC (cT3c, cN0, cM0) - Signed by Heath Lark, MD on 04/11/2021 Stage prefix: Initial diagnosis   04/18/2021 Tumor Marker   Patient's tumor was tested for the following markers: CA-125. Results of the tumor marker test revealed [id number]   04/22/2021 -  Chemotherapy    Patient is on Treatment Plan: OVARIAN CARBOPLATIN (AUC 6) / PACLITAXEL (175) Q21D X 6 CYCLES       05/14/2021 Tumor Marker   Patient's tumor was tested for the following markers: CA-125. Results of the tumor marker test revealed 877.   06/10/2021 Imaging   1. Today's study demonstrates a centrally stable disease when compared to prior examination from 04/05/2021, with large bilateral ovarian lesions and moderate volume of malignant ascites, as detailed above. 2. Trace right pleural effusion lying dependently.     07/02/2021 Surgery     Preoperative Diagnosis: Malignancy presumed to be of gyn origin based on IHC, lack of clinical response to NACT   Postoperative Diagnosis: Stage IIIC presumed ovarian cancer    Procedure(s) Performed: Exploratory laparotomy with total hysterectomy bilateral salpingo-oophorectomy, total abdominal hysterectomy, tumor debulking including omentectomy and excision of several tumor plaques versus treated disease, oversew of bladder peritoneum   Surgeon: Valarie Cones, MD    Specimens: Uterus Cervix, Bilateral tubes / ovaries, peritoneal plaques (sent with uterus and cervix),  omentum   Estimated Blood Loss: 250 mL.     Ascites: approximately 2L upon entry and produced during surgery Operative  Findings: Small mobile uterus, nodular masses filling the cul de sac. On intra-abdominal entry, approximately 1.6L of green-tinged ascites was encountered. Normal liver edge, diaphragm, and stomach. Infracolic omentum with a [id number] nodular area and several smaller (1-3cm) deposits c/w tumor. Otherwise, omentum without evidence of disease. Small fibrinous debris on the small bowel, no obvious tumor implants. Mesentery free of disease. No appreciable adenopathy. Bilateral ovaries replaced with vesicular tumors (L>R), measuring approximately 8cm and 12cm. Several areas of plaque (appeared more consistent with treated tumor than active cancer - right pelvic sidewall, posterior cul de sac, and anterior cul de sac) noted, most excised. Uterus 4-6cm in size. Dense adhesions of the bladder to the LUS and cervix, likely combination of prior c-section and treatment effect. R0 resection at the end of surgery if peritoneal plaques not c/w tumor. No clear umbilical hernia.    07/02/2021 Pathology Results   A. 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   - Surface involvement present.  - Fallopian tube involvement present, non-invasive.   B. OVARY AND FALLOPIAN TUBE, LEFT, SALPINGO OOPHORECTOMY:  - Serous borderline tumor.  - Surface involvement present, non-invasive.  - Fallopian tube involvement present, non-invasive.   C. UTERUS AND CERVIX, HYSTERECTOMY:  - Uterus:       Endometrium: Benign endometrial type polyp. Inactive endometrium.  No hyperplasia or malignancy.       Myometrium: Unremarkable. No malignancy.       Serosa: Invasive serous implant.  - Cervix: Benign squamous and endocervical mucosa. No dysplasia or  malignancy.  - Bilateral fallopian tubes: Surface involvement present.   D. OMENTUM:  - Non-invasive serous implant.  - One of one lymph nodes negative for carcinoma (0/1).   ONCOLOGY TABLE:   OVARY  or FALLOPIAN TUBE or PRIMARY PERITONEUM: Resection   Procedure: Hysterectomy with bilateral salpingo-oophorectomy and omental  resection.  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  Other Tissue/ Organ Involvement: Not applicable  Largest Extrapelvic Peritoneal Focus: 2 mm  Peritoneal/Ascitic Fluid Involvement: Not applicable  Chemotherapy Response Score (CRS): Cannot be determined  Regional Lymph Nodes:       Number of Nodes with Metastasis Greater than 10 mm: 0       Number of Nodes with Metastasis 10 mm or Less (excludes isolated  tumor cells): 0       Number of Nodes with Isolated Tumor Cells (0.2 mm or less): 0       Number of Lymph Nodes Examined: 1  Distant Metastasis:       Distant Site(s) Involved: Not applicable  Pathologic Stage Classification (pTNM, AJCC 8th Edition): ypT3a, ypN0  Ancillary Studies: Can be performed upon request  Representative Tumor Block: A7  Comment(s): Given extensive involvement and neoadjuvant therapy it is  difficult to determine the exact primary location, but the right ovary  is slightly favored. Dr. Saralyn Pilar has reviewed the case.    07/02/2021 Pathology Results   FINAL MICROSCOPIC DIAGNOSIS:   A.  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   - Surface involvement present.  - Fallopian tube involvement present, non-invasive.   B. OVARY AND FALLOPIAN TUBE, LEFT, SALPINGO OOPHORECTOMY:  - Serous borderline tumor.  - Surface involvement present,  non-invasive.  - Fallopian tube involvement present, non-invasive.   C. UTERUS AND CERVIX, HYSTERECTOMY:  - Uterus:       Endometrium: Benign endometrial type polyp. Inactive endometrium.  No hyperplasia or malignancy.       Myometrium: Unremarkable. No malignancy.       Serosa: Invasive serous implant.  - Cervix: Benign squamous and endocervical mucosa. No dysplasia or malignancy.  - Bilateral fallopian tubes: Surface involvement present.   D. OMENTUM:  - Non-invasive serous implant.  - One of one lymph nodes negative for carcinoma (0/1).   ONCOLOGY TABLE:   OVARY or FALLOPIAN TUBE or PRIMARY PERITONEUM: Resection   Procedure: Hysterectomy with bilateral salpingo-oophorectomy and omental resection.  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  Other Tissue/ Organ Involvement: Not applicable  Largest Extrapelvic Peritoneal Focus: 2 mm  Peritoneal/Ascitic Fluid Involvement: Not applicable  Chemotherapy Response Score (CRS): Cannot be determined  Regional Lymph Nodes:       Number of Nodes with Metastasis Greater than 10 mm: 0       Number of Nodes with Metastasis 10 mm or Less (excludes isolated  tumor cells): 0       Number of Nodes with Isolated Tumor Cells (0.2 mm or less): 0       Number of Lymph Nodes Examined: 1  Distant Metastasis:       Distant Site(s) Involved: Not applicable  Pathologic Stage Classification (pTNM, AJCC 8th Edition): ypT3a, ypN0  Ancillary Studies: Can be performed upon request  Representative Tumor Block: A7  Comment(s): Given extensive  involvement and neoadjuvant therapy it is difficult to determine the exact primary location, but the right ovary is slightly favored. Dr. Saralyn Pilar has reviewed the case.    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     Ascites: approximately 2L upon entry and produced during surgery Operative Findings: Small mobile uterus, nodular masses filling the cul de sac. On intra-abdominal entry, approximately 1.6L of green-tinged ascites was encountered. Normal liver edge, diaphragm, and stomach. Infracolic omentum with a [id number] nodular area and several smaller (1-3cm) deposits c/w tumor. Otherwise, omentum without evidence of disease. Small fibrinous debris on the small bowel, no obvious tumor implants. Mesentery free of disease. No appreciable adenopathy. Bilateral ovaries replaced with vesicular tumors (L>R), measuring approximately 8cm and 12cm. Several areas of plaque (appeared  more consistent with treated tumor than active cancer - right pelvic sidewall, posterior cul de sac, and anterior cul de sac) noted, most excised. Uterus 4-6cm in size. Dense adhesions of the bladder to the LUS and cervix, likely combination of prior c-section and treatment effect. R0 resection at the end of surgery if peritoneal plaques not c/w tumor. No clear umbilical hernia.      Interval History: Patient presents today  for follow-up after surgery.  She notes overall doing well.  She has had improvement of her appetite.  She has gained a pound since surgery.  Her bowel function is almost daily now, she is still using MiraLAX and senna regularly.  She denies any bladder symptoms.  She has had a small amount of vaginal discharge for several days, otherwise denies discharge or vaginal bleeding.  She has occasional left lower quadrant pelvic pain.  She is due for bone density scan.  She will follow-up with her PCP in terms of getting this scheduled.  Past Medical/Surgical History: Past Medical History:  Diagnosis Date   Anemia    Bronchitis    Cancer ([place])    Complication of anesthesia    Endometriosis    Family history of renal cancer 05/09/2021   History of anemia    Migraines    Osteoporosis    PONV (postoperative nausea and vomiting)     Past Surgical History:  Procedure Laterality Date   [place]   and [facility]   COLONOSCOPY     DEBULKING N/A 07/02/2021   Procedure: TUMOR DEBULKING;  Surgeon: Lafonda Mosses, MD;  Location: [facility];  Service: Gynecology;  Laterality: N/A;   HYSTERECTOMY ABDOMINAL WITH SALPINGO-OOPHORECTOMY N/A 07/02/2021   Procedure: HYSTERECTOMY ABDOMINAL WITH BILATERAL SALPINGO-OOPHORECTOMY; OMENTECTOMY;  Surgeon: Lafonda Mosses, MD;  Location: [facility];  Service: Gynecology;  Laterality: N/A;   LAPAROSCOPY     for infertility-endometriosis   LAPAROSCOPY     2nd for infertility   mole removed     precancerous mole right upper abd   ORIF WRIST FRACTURE Right 08/02/2020   Procedure: Right distal radius open reduction internal fixation;  Surgeon: Verner Mould, MD;  Location: [facility];  Service: Orthopedics;  Laterality: Right;  68mn   TONSILLECTOMY AND ADENOIDECTOMY      Family History  Problem Relation Age of Onset   Renal cancer Father 553      Agent Orange exposure   Hypertension Sister    Hypothyroidism Sister    Other Sister        Wegener's  syndrome   Hypertension Brother    Diabetes Maternal Grandmother    Heart attack Paternal Grandfather     Social History   Socioeconomic History   Marital status: Married    Spouse name: Karen Winters   Number of children: 2   Years of education: Not on file   Highest education level: Not on file  Occupational History   Occupation: retired  Tobacco Use   Smoking status: Former    Types: Cigarettes    Quit date: 12/02/1983    Years since quitting: 37.6   Smokeless tobacco: Never   Tobacco comments:    quit 1985  Vaping Use   Vaping Use: Never used  Substance and Sexual Activity   Alcohol use: Not Currently    Comment: ocassional   Drug use: No   Sexual activity: Yes    Partners: Male    Birth control/protection: Surgical, Post-menopausal  Comment: BTL  Other Topics Concern   Not on file  Social History Narrative   Not on file   Social Determinants of Health   Financial Resource Strain: Not on file  Food Insecurity: Not on file  Transportation Needs: Not on file  Physical Activity: Not on file  Stress: Not on file  Social Connections: Not on file    Current Medications:  Current Outpatient Medications:    acetaminophen (TYLENOL) 500 MG tablet, Take 500 mg by mouth every 6 (six) hours as needed for moderate pain or headache., Disp: , Rfl:    dexamethasone (DECADRON) 4 MG tablet, Take 2 tabs at the night before and 2 tab the morning of chemotherapy, every 3 weeks, by mouth x 6 cycles (Patient taking differently: Take 8 mg by mouth See admin instructions. Take 8 mg at the night before and 8 mg the morning of chemotherapy, every 3 weeks, by mouth x 6 cycles), Disp: 36 tablet, Rfl: 6   enoxaparin (LOVENOX) 40 MG/0.4ML injection, Inject 0.4 mLs (40 mg total) into the skin daily for 28 days. For AFTER surgery only, Disp: 11.2 mL, Rfl: 0   EPINEPHrine 0.3 mg/0.3 mL IJ SOAJ injection, Inject 0.3 mg as directed as needed (allergic reaction). , Disp: , Rfl:    ibuprofen (ADVIL) 600  MG tablet, Take 1 tablet (600 mg total) by mouth every 6 (six) hours as needed for moderate pain. For AFTER surgery, Disp: 30 tablet, Rfl: 0   letrozole (FEMARA) 2.5 MG tablet, Take 1 tablet (2.5 mg total) by mouth daily., Disp: 60 tablet, Rfl: 6   Multiple Vitamins-Minerals (ADULT GUMMY PO), Take 2 capsules by mouth daily., Disp: , Rfl:    ondansetron (ZOFRAN) 8 MG tablet, Take 1 tablet (8 mg total) by mouth 2 (two) times daily as needed for refractory nausea / vomiting. Start on day 3 after carboplatin chemo. (Patient not taking: No sig reported), Disp: 30 tablet, Rfl: 1   polyethylene glycol (MIRALAX / GLYCOLAX) 17 g packet, Take 17 g by mouth daily., Disp: , Rfl:    prochlorperazine (COMPAZINE) 10 MG tablet, Take 1 tablet (10 mg total) by mouth every 6 (six) hours as needed (Nausea or vomiting). (Patient not taking: No sig reported), Disp: 30 tablet, Rfl: 1   senna (SENOKOT) 8.6 MG TABS tablet, Take 17.2 mg by mouth daily., Disp: , Rfl:    senna-docusate (SENOKOT-S) 8.6-50 MG tablet, Take 2 tablets by mouth at bedtime. For AFTER surgery, do not take if having diarrhea, Disp: 30 tablet, Rfl: 0   SUMAtriptan (IMITREX) 100 MG tablet, 1 tab with headache onset.  Can repeat in 2 hours.  Max dosage '200mg'$ /24 hours. (Patient taking differently: Take 100 mg by mouth every 2 (two) hours as needed for migraine. Can repeat in 2 hours.  Max dosage '200mg'$ /24 hours.), Disp: 27 tablet, Rfl: 4   traMADol (ULTRAM) 50 MG tablet, Take 1 tablet (50 mg total) by mouth every 6 (six) hours as needed for severe pain. For AFTER surgery only, do not take and drive, Disp: 10 tablet, Rfl: 0  Review of Systems: Denies appetite changes, fevers, chills, fatigue, unexplained weight changes. Denies hearing loss, neck lumps or masses, mouth sores, ringing in ears or voice changes. Denies cough or wheezing.  Denies shortness of breath. Denies chest pain or palpitations. Denies leg swelling. Denies abdominal distention, pain, blood  in stools, constipation, diarrhea, nausea, vomiting, or early satiety. Denies pain with intercourse, dysuria, frequency, hematuria or incontinence. Denies hot flashes, pelvic  pain, vaginal bleeding or vaginal discharge.   Denies joint pain, back pain or muscle pain/cramps. Denies itching, rash, or wounds. Denies dizziness, headaches, numbness or seizures. Denies swollen lymph nodes or glands, denies easy bruising or bleeding. Denies anxiety, depression, confusion, or decreased concentration.  Physical Exam: BP (!) 133/99   Pulse 78   Temp 97.6 F (36.4 C) (Oral)   Resp 18   Ht '5\' 5"'$  (1.651 m)   Wt 118 lb 3.2 oz (53.6 kg)   LMP 10/31/2008   SpO2 100%   BMI 19.67 kg/m  General: Alert, oriented, no acute distress. HEENT: Normocephalic, atraumatic, sclera anicteric. Chest: Clear to auscultation bilaterally.  No wheezes or rhonchi.  Unlabored breathing on room air. Cardiovascular: Regular rate and rhythm, no murmurs. Abdomen: soft, nontender.  Normoactive bowel sounds.  No masses or hepatosplenomegaly appreciated.  Well-healing midline laparotomy.  Mild swelling around the incision, no erythema, induration, or exudate.  Remaining Dermabond removed. Extremities: Grossly normal range of motion.  Warm, well perfused.  No edema bilaterally. Skin: No rashes or lesions noted. GU: Normal appearing external genitalia without erythema, excoriation, or lesions.  Speculum exam reveals some agglutination along the right aspect of the mid vagina, no bleeding or discharge noted.  Cuff intact, some suture visible.  Bimanual exam reveals agglutination present along the right aspect of the mid vagina, easily finger fractured, cuff intact, no fluctuance or tenderness with palpation.    Laboratory & Radiologic Studies: None new  Assessment & Plan: Karen Winters is a 70 y.o. 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    The patient is doing well from a postoperative standpoint and meeting milestones.  We gust continued activity restrictions and limitations as well as postoperative expectations.  Patient asked about her genetic testing results.  I do not see these in epic.  I will send a message to the genetic counselor who saw her before her testing was performed to see if we can get these results uploaded.  I reviewed in detail with the patient and her family her pathology.  We discussed both the serous borderline tumor component as well as the low-grade serous component.  Given inability to know extent of the low-grade serous carcinoma prior to initiation of chemotherapy, I favor adjuvant treatment.  While she did not appear to have much radiologic or CA-125 response to traditional chemotherapy, I recommend that we proceed with maintenance hormonal therapy using letrozole.  We discussed common side effects of letrozole and the patient was given handout regarding the medication as well as uncommon and common side effects.  She does not take calcium and vitamin D regularly, which I have encouraged.  She is also due for a bone density scan which she will try to get scheduled in the next month or 2.  We will get a CA-125 today to get a baseline after before starting hormonal therapy.  44 minutes of total time was spent for this patient encounter, including preparation, face-to-face counseling with the patient and coordination of care, and documentation of the encounter.  Jeral Pinch, MD  Division of Gynecologic Oncology  Department of Obstetrics and Gynecology  [facility]

## 2021-07-18 ENCOUNTER — Encounter: Payer: Self-pay | Admitting: Gynecologic Oncology

## 2021-07-18 ENCOUNTER — Other Ambulatory Visit: Payer: Self-pay | Admitting: Hematology and Oncology

## 2021-07-18 ENCOUNTER — Encounter: Payer: Self-pay | Admitting: Hematology and Oncology

## 2021-07-18 LAB — CA 125: Cancer Antigen (CA) 125: 116 U/mL — ABNORMAL HIGH (ref 0.0–38.1)

## 2021-07-20 ENCOUNTER — Encounter (HOSPITAL_BASED_OUTPATIENT_CLINIC_OR_DEPARTMENT_OTHER): Payer: Self-pay

## 2021-07-30 ENCOUNTER — Encounter: Payer: Self-pay | Admitting: Gynecologic Oncology

## 2021-08-01 NOTE — Telephone Encounter (Signed)
Told Karen Winters that Dr. Berline Lopes said that she needed to be diligent about taking Calcium and vitamin D supplements daily as she has bone density loss on 10-12-2 bone density scan. She should repeat bone density in 1 year per Dr. Berline Lopes. Her PCP can set up bone density scan in a year. Pt verbalized understanding.

## 2021-08-09 ENCOUNTER — Encounter: Payer: Self-pay | Admitting: Genetic Counselor

## 2021-08-09 ENCOUNTER — Telehealth: Payer: Self-pay | Admitting: Genetic Counselor

## 2021-08-09 ENCOUNTER — Ambulatory Visit: Payer: Self-pay | Admitting: Genetic Counselor

## 2021-08-09 DIAGNOSIS — Z1379 Encounter for other screening for genetic and chromosomal anomalies: Secondary | ICD-10-CM | POA: Insufficient documentation

## 2021-08-09 DIAGNOSIS — C563 Malignant neoplasm of bilateral ovaries: Secondary | ICD-10-CM

## 2021-08-09 NOTE — Telephone Encounter (Signed)
Revealed negative genetic testing.  Discussed that we do not know why she has ovarian cancer or why there is cancer in the family. It could be sporadic/famililal, due to a different gene that we are not testing, or maybe our current technology may not be able to pick something up.  It will be important for her to keep in contact with genetics to keep up with whether additional testing may be needed.  No somatic mutation detected to impact treatment.

## 2021-08-09 NOTE — Progress Notes (Signed)
HPI:  Karen Winters was previously seen in the [facility] due to a personal history of cancer and concerns regarding a hereditary predisposition to cancer. Please refer to our prior cancer genetics clinic note for more information regarding our discussion, assessment and recommendations, at the time. Karen Winters recent genetic test results were disclosed to her, as were recommendations warranted by these results. These results and recommendations are discussed in more detail below.  CANCER HISTORY:  Oncology History Overview Note  Borderline serous carcinoma   Ovarian cancer ([place])  04/01/2021 Initial Diagnosis   She notes that she noticed an umbilical hernia.  She was scheduled to see a surgeon about possible repair.  She also endorses intermittent pain in around the area of her right hip that she thought was secondary to her exercise routine.  Over the last week, she has had a decreased appetite.  She denies any nausea or emesis but endorses several months of abdominal bloating and early satiety.  She notes normal bowel function, which she describes as bowel movements at least daily.  She has had some increased urinary frequency and around [place] developed stress urinary incontinence when she runs.     04/04/2021 Imaging   US pelvis  Septate uterus, otherwise unremarkable.   Normal endometrial complex.   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   Recommend further assessment by CT imaging with IV and oral contrast.   04/05/2021 Tumor Marker   Patient's tumor was tested for the following markers: CA-125 Results of the tumor marker test revealed 826.   04/05/2021 Imaging   1. Large bilateral solid-appearing adnexal masses are identified concerning for ovarian neoplasm. There is soft tissue infiltration into the surrounding peritoneal cavity which partially encases  nonobstructed loops of small bowel with probable serosal involvement. 2. Moderate volume of ascites identified within the abdomen and pelvis likely secondary to peritoneal carcinomatosis. Diagnostic paracentesis may be helpful for further workup. 3. Mild increased caliber of small bowel loops with a few air-fluid levels. However, there are no signs to suggest a bowel obstruction as enteric contrast material is noted up to the level of the hepatic flexure.   04/10/2021 Initial Diagnosis   Ovarian cancer ([place])   04/10/2021 Procedure   Successful ultrasound-guided paracentesis yielding 1.4 liters of peritoneal fluid.   04/11/2021 Cancer Staging   Staging form: Ovary, Fallopian Tube, and Primary Peritoneal Carcinoma, AJCC 8th Edition - Clinical stage from 04/11/2021: Stage IIIC (cT3c, cN0, cM0) - Signed by Heath Lark, MD on 04/11/2021 Stage prefix: Initial diagnosis   04/18/2021 Tumor Marker   Patient's tumor was tested for the following markers: CA-125. Results of the tumor marker test revealed 771   04/22/2021 - 06/05/2021 Chemotherapy          05/14/2021 Tumor Marker   Patient's tumor was tested for the following markers: CA-125. Results of the tumor marker test revealed 877.   06/10/2021 Imaging   1. Today's study demonstrates a centrally stable disease when compared to prior examination from 04/05/2021, with large bilateral ovarian lesions and moderate volume of malignant ascites, as detailed above. 2. Trace right pleural effusion lying dependently.     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     Ascites: approximately 2L upon entry and produced during surgery Operative Findings: Small mobile uterus, nodular masses filling the cul de sac. On intra-abdominal entry, approximately 1.6L of green-tinged ascites was encountered. Normal liver edge, diaphragm, and stomach. Infracolic omentum with a [id number] nodular area and several smaller (1-3cm) deposits c/w tumor. Otherwise, omentum without evidence of disease. Small fibrinous debris on the small bowel, no obvious tumor implants. Mesentery free of disease. No appreciable adenopathy. Bilateral ovaries replaced with vesicular tumors (L>R), measuring approximately 8cm and 12cm. Several areas of plaque (appeared more consistent with treated tumor than active cancer - right pelvic sidewall, posterior cul de sac, and anterior cul de sac) noted, most excised. Uterus 4-6cm in size. Dense adhesions of the bladder to the LUS and cervix, likely combination of prior c-section and treatment effect. R0 resection at the end of surgery if peritoneal plaques not c/w tumor. No clear umbilical hernia.    07/02/2021 Pathology Results   A. 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   - Surface involvement present.  - Fallopian tube involvement present, non-invasive.   B. OVARY AND FALLOPIAN TUBE, LEFT, SALPINGO OOPHORECTOMY:  - Serous borderline tumor.  - Surface involvement present, non-invasive.  - Fallopian tube involvement present, non-invasive.   C. UTERUS AND CERVIX, HYSTERECTOMY:  - Uterus:       Endometrium: Benign endometrial type polyp. Inactive endometrium.  No hyperplasia or malignancy.       Myometrium: Unremarkable. No malignancy.       Serosa: Invasive serous  implant.  - Cervix: Benign squamous and endocervical mucosa. No dysplasia or  malignancy.  - Bilateral fallopian tubes: Surface involvement present.   D. OMENTUM:  - Non-invasive serous implant.  - One of one lymph nodes negative for carcinoma (0/1).   ONCOLOGY TABLE:   OVARY or FALLOPIAN TUBE or PRIMARY PERITONEUM: Resection   Procedure: Hysterectomy with bilateral salpingo-oophorectomy and omental  resection.  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  Other Tissue/ Organ Involvement: Not applicable  Largest Extrapelvic Peritoneal Focus: 2 mm  Peritoneal/Ascitic Fluid Involvement: Not applicable  Chemotherapy Response Score (CRS): Cannot be determined  Regional Lymph Nodes:       Number of Nodes with Metastasis Greater than 10 mm: 0       Number of Nodes with Metastasis 10 mm or Less (excludes isolated  tumor cells): 0       Number of Nodes with Isolated Tumor Cells (0.2 mm or less): 0       Number of Lymph Nodes Examined: 1  Distant Metastasis:       Distant Site(s) Involved: Not applicable  Pathologic Stage Classification (pTNM, AJCC 8th Edition): ypT3a, ypN0  Ancillary Studies: Can be performed upon request  Representative Tumor Block: A7  Comment(s): Given extensive involvement and neoadjuvant therapy it is  difficult to determine the exact primary location, but the right ovary  is slightly favored. Dr. Saralyn Pilar has reviewed the case.    07/02/2021 Pathology Results   FINAL MICROSCOPIC DIAGNOSIS:   A. 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   - Surface involvement present.  - Fallopian tube involvement present, non-invasive.   B. OVARY AND FALLOPIAN TUBE, LEFT, SALPINGO OOPHORECTOMY:  - Serous borderline tumor.  - Surface involvement present, non-invasive.  - Fallopian tube involvement present, non-invasive.   C. UTERUS AND CERVIX, HYSTERECTOMY:  - Uterus:       Endometrium: Benign endometrial type polyp. Inactive endometrium.  No hyperplasia or malignancy.       Myometrium: Unremarkable. No malignancy.       Serosa: Invasive serous implant.  - Cervix: Benign squamous and endocervical mucosa. No dysplasia or malignancy.  - Bilateral fallopian tubes: Surface involvement present.   D. OMENTUM:  - Non-invasive serous implant.  - One of one lymph nodes negative for carcinoma (0/1).   ONCOLOGY TABLE:   OVARY or FALLOPIAN TUBE or PRIMARY PERITONEUM: Resection   Procedure: Hysterectomy with bilateral salpingo-oophorectomy and omental resection.  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  Other Tissue/ Organ Involvement: Not applicable  Largest Extrapelvic Peritoneal Focus: 2 mm  Peritoneal/Ascitic Fluid Involvement: Not applicable  Chemotherapy Response Score (CRS): Cannot be determined  Regional Lymph Nodes:       Number of Nodes with Metastasis Greater than 10 mm: 0       Number of Nodes with Metastasis 10 mm or Less (excludes isolated  tumor cells): 0       Number of Nodes with Isolated Tumor Cells (0.2 mm or less): 0       Number of  Lymph Nodes Examined: 1  Distant Metastasis:       Distant Site(s) Involved: Not applicable  Pathologic Stage Classification (pTNM, AJCC 8th Edition): ypT3a, ypN0  Ancillary Studies: Can be performed upon request  Representative Tumor Block: A7  Comment(s): Given extensive involvement and neoadjuvant therapy it is difficult to determine the exact primary location, but the right ovary is slightly favored. Dr. Saralyn Pilar has reviewed the case.    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     Ascites: approximately 2L upon entry and produced during surgery Operative Findings: Small mobile uterus, nodular masses filling the cul de sac. On intra-abdominal entry, approximately 1.6L of green-tinged ascites was encountered. Normal liver edge, diaphragm, and stomach. Infracolic omentum with a 8G9FA nodular area and several smaller (  1-3cm) deposits c/w tumor. Otherwise, omentum without evidence of disease. Small fibrinous debris on the small bowel, no obvious tumor implants. Mesentery free of disease. No appreciable adenopathy. Bilateral ovaries replaced with vesicular tumors (L>R), measuring approximately 8cm and 12cm. Several areas of plaque (appeared more consistent with treated tumor than active cancer - right pelvic sidewall, posterior cul de sac, and anterior cul de sac) noted, most excised. Uterus  4-6cm in size. Dense adhesions of the bladder to the LUS and cervix, likely combination of prior c-section and treatment effect. R0 resection at the end of surgery if peritoneal plaques not c/w tumor. No clear umbilical hernia.    07/17/2021 Tumor Marker   Patient's tumor was tested for the following markers: CA-125. Results of the tumor marker test revealed 116.   08/06/2021 Genetic Testing   Negative genetic testing: no pathogenic variants detected in [facility] TumorNext-HRD + CancerNext Panel.  The report date is August 06, 2021.    The CancerNext gene panel offered by [facility] includes sequencing and rearrangement analysis for the following 37 genes:   APC, ATM, AXIN2 BARD1, BMPR1A, BRCA1, BRCA2, BRIP1, CDH1, CDK4, CDKN2A, CHEK2, DICER1, HOXB13, EPCAM, GREM1, MLH1, MRE11A, MSH2, MSH3, MSH6, MUTYH, NBN, NF1, PALB2, PMS2, POLD1, POLE, PTEN, RAD50, RAD51C, RAD51D, RECQL, SMAD4, SMARCA4, STK11, and TP53. TumorNext-HRD is a paired tumor and germline analysis of BRCA1 and BRCA2 plus 9 additional genes in the homologous recombination repair pathway (ATM, BARD1, BRCA1, BRCA2, BRIP1, CHEK2, MRE11A, NBN, PALB2, RAD51C, RAD51D).     FAMILY HISTORY:  We obtained a detailed, 4-generation family history.  Significant diagnoses are listed below: Family History  Problem Relation Age of Onset   Renal cancer Father 11       Agent Orange exposure    Karen Winters is unaware of previous family history of genetic testing for hereditary cancer risks. Patient's maternal ancestors are of [place] descent, and paternal ancestors are of [place], [place], and [place] descent. There is no reported Ashkenazi Jewish ancestry. There is no known consanguinity.    GENETIC TEST RESULTS: Genetic testing reported out on August 06, 2021. The CancerNext and TumorNext-HRD Panel found no pathogenic mutations of germline or somatic origin. The CancerNext gene panel offered by [facility] includes sequencing and rearrangement  analysis for the following 37 genes:   APC, ATM, AXIN2 BARD1, BMPR1A, BRCA1, BRCA2, BRIP1, CDH1, CDK4, CDKN2A, CHEK2, DICER1, HOXB13, EPCAM, GREM1, MLH1, MRE11A, MSH2, MSH3, MSH6, MUTYH, NBN, NF1, PALB2, PMS2, POLD1, POLE, PTEN, RAD50, RAD51C, RAD51D, RECQL, SMAD4, SMARCA4, STK11, and TP53. TumorNext-HRD is a paired tumor and germline analysis of BRCA1 and BRCA2 plus 9 additional genes in the homologous recombination repair pathway (ATM, BARD1, BRCA1, BRCA2, BRIP1, CHEK2, MRE11A, NBN, PALB2, RAD51C, RAD51D).   The test report has been scanned into EPIC and is located under the Molecular Pathology section of the Results Review tab.  A portion of the result report is included below for reference.     We discussed with Karen Winters that because current genetic testing is not perfect, it is possible there may be a gene mutation in one of these genes that current testing cannot detect, but that chance is small.  We also discussed, that there could be another gene that has not yet been discovered, or that we have not yet tested, that is responsible for the cancer diagnoses in the family. It is also possible there is a hereditary cause for the cancer in the family that Karen Winters did not inherit and therefore was not  identified in her testing.  Therefore, it is important to remain in touch with cancer genetics in the future so that we can continue to offer Karen Winters the most up to date genetic testing.   One goal of Karen Winters's genetic testing was to identify if she may be a candidate for targeted treatment with PARP inhibitors. Therefore, in addition to having germline genetic testing to analyze for inherited variants, Karen Winters also had testing for homologous recombination deficiency (HRD).  HRD testing is a genetic test performed on the tumor that can determine somatic genetic changes that may indicate if Karen Winters would benefit from PARP inhibitors.  Because her test did not detect any pathogenic variants in  her germline or somatic (tumor) testing, there are no targeted treatment recommendations based on these results.  ADDITIONAL GENETIC TESTING: We discussed with Karen Winters that there are other genes that are associated with increased cancer risk that can be analyzed. Should Karen Winters wish to pursue additional genetic testing, we are happy to discuss and coordinate this testing, at any time.     CANCER SCREENING RECOMMENDATIONS: Karen Winters test result is considered negative (normal).  This means that we have not identified a hereditary cause for her personal history of cancer at this time. Most cancers happen by chance and this negative test suggests that her cancer may fall into this category.    While reassuring, this does not definitively rule out a hereditary predisposition to cancer. It is still possible that there could be genetic mutations that are undetectable by current technology. There could be genetic mutations in genes that have not been tested or identified to increase cancer risk.  Therefore, it is recommended she continue to follow the cancer management and screening guidelines provided by her oncology and primary healthcare provider.   An individual's cancer risk and medical management are not determined by genetic test results alone. Overall cancer risk assessment incorporates additional factors, including personal medical history, family history, and any available genetic information that may result in a personalized plan for cancer prevention and surveillance  RECOMMENDATIONS FOR FAMILY MEMBERS:  Individuals in this family might be at some increased risk of developing cancer, over the general population risk, simply due to the family history of cancer.  We recommended women in this family have a yearly mammogram beginning at age 37, or 81 years younger than the earliest onset of cancer, an annual clinical breast exam, and perform monthly breast self-exams. Women in this family should  also have a gynecological exam as recommended by their primary provider. Family members should be referred for colonoscopy starting at age 27, or earlier, as recommended by their providers.   FOLLOW-UP: Lastly, we discussed with Karen Winters that cancer genetics is a rapidly advancing field and it is possible that new genetic tests will be appropriate for her and/or her family members in the future. We encouraged her to remain in contact with cancer genetics on an annual basis so we can update her personal and family histories and let her know of advances in cancer genetics that may benefit this family.   Our contact number was provided. Karen Winters questions were answered to her satisfaction, and she knows she is welcome to call us at anytime with additional questions or concerns.     Nidal Rivet M. Joette Catching, [place], [facility] Genetic Counselor [email or web address] (P) [phone]

## 2021-08-12 ENCOUNTER — Ambulatory Visit (INDEPENDENT_AMBULATORY_CARE_PROVIDER_SITE_OTHER): Payer: Medicare HMO | Admitting: Nurse Practitioner

## 2021-08-12 ENCOUNTER — Other Ambulatory Visit: Payer: Self-pay

## 2021-08-12 ENCOUNTER — Encounter (HOSPITAL_BASED_OUTPATIENT_CLINIC_OR_DEPARTMENT_OTHER): Payer: Self-pay | Admitting: Nurse Practitioner

## 2021-08-12 VITALS — BP 137/80 | HR 65 | Ht 65.0 in | Wt 122.2 lb

## 2021-08-12 DIAGNOSIS — Z0000 Encounter for general adult medical examination without abnormal findings: Secondary | ICD-10-CM | POA: Insufficient documentation

## 2021-08-12 DIAGNOSIS — L570 Actinic keratosis: Secondary | ICD-10-CM | POA: Insufficient documentation

## 2021-08-12 DIAGNOSIS — Z13820 Encounter for screening for osteoporosis: Secondary | ICD-10-CM | POA: Diagnosis not present

## 2021-08-12 HISTORY — DX: Encounter for screening for osteoporosis: Z13.820

## 2021-08-12 NOTE — Progress Notes (Signed)
Orma Render, DNP, AGNP-c [facility] [address]  [place],  [address] [phone]  New patient visit   Patient: Karen Winters   DOB: 15-Jan-1951   70 y.o. Female  MRN: [id number] Visit Date: 08/12/2021  Patient Care Team: Marieli Rudy, Coralee Pesa, NP as PCP - General (Nurse Practitioner) Jacqulyn Liner, RN as Oncology Nurse Navigator (Oncology)  Today's healthcare provider: Orma Render, NP   Chief Complaint  Patient presents with   Establish Care    Patient states she wants to have a bone density exam. Patient states she have a dry patch in the crown of her head.  Patient states it itches and she uses a cream and vitamin E on it which helps.     Subjective    Karen Winters is a 70 y.o. female who presents today as a new patient to establish care.  HPI HPI     Establish Care    Additional comments: Patient states she wants to have a bone density exam. Patient states she have a dry patch in the crown of her head.  Patient states it itches and she uses a cream and vitamin E on it which helps.        Last edited by Virginia Crews D, CMA on 08/12/2021 11:27 AM.       Past Medical History:  Diagnosis Date   Anemia    Bronchitis    Cancer ([place])    Complication of anesthesia    Endometriosis    Family history of renal cancer 05/09/2021   History of anemia    Migraines    Osteoporosis    PONV (postoperative nausea and vomiting)    Past Surgical History:  Procedure Laterality Date   [place]   and BTSP   COLONOSCOPY     DEBULKING N/A 07/02/2021   Procedure: TUMOR DEBULKING;  Surgeon: Lafonda Mosses, MD;  Location: [facility];  Service: Gynecology;  Laterality: N/A;   HYSTERECTOMY ABDOMINAL WITH SALPINGO-OOPHORECTOMY N/A 07/02/2021   Procedure: HYSTERECTOMY ABDOMINAL WITH BILATERAL SALPINGO-OOPHORECTOMY; OMENTECTOMY;  Surgeon: Lafonda Mosses, MD;  Location: [facility];  Service: Gynecology;   Laterality: N/A;   LAPAROSCOPY     for infertility-endometriosis   LAPAROSCOPY     2nd for infertility   mole removed     precancerous mole right upper abd   ORIF WRIST FRACTURE Right 08/02/2020   Procedure: Right distal radius open reduction internal fixation;  Surgeon: Verner Mould, MD;  Location: [facility];  Service: Orthopedics;  Laterality: Right;  74mn   TONSILLECTOMY AND ADENOIDECTOMY     Family Status  Relation Name Status   Father  (Not Specified)   Sister  (Not Specified)   Sister  (Not Specified)   Sister  (Not Specified)   Brother  (Not Specified)   MGM  (Not Specified)   PGF  (Not Specified)   Family History  Problem Relation Age of Onset   Renal cancer Father 531      Agent Orange exposure   Hypertension Sister    Hypothyroidism Sister    Other Sister        Wegener's syndrome   Hypertension Brother    Diabetes Maternal Grandmother    Heart attack Paternal Grandfather    Social History   Socioeconomic History   Marital status: Married    Spouse name: PAbbe [place]  Number of children: 2  Years of education: Not on file   Highest education level: Not on file  Occupational History   Occupation: retired  Tobacco Use   Smoking status: Former    Types: Cigarettes    Quit date: 12/02/1983    Years since quitting: 37.7   Smokeless tobacco: Never   Tobacco comments:    quit 1985  Vaping Use   Vaping Use: Never used  Substance and Sexual Activity   Alcohol use: Not Currently    Comment: ocassional   Drug use: No   Sexual activity: Yes    Partners: Male    Birth control/protection: Surgical, Post-menopausal    Comment: BTL  Other Topics Concern   Not on file  Social History Narrative   Not on file   Social Determinants of Health   Financial Resource Strain: Not on file  Food Insecurity: Not on file  Transportation Needs: No Transportation Needs   Lack of Transportation (Medical): No   Lack of Transportation (Non-Medical): No  Physical Activity:  Sufficiently Active   Days of Exercise per Week: 4 days   Minutes of Exercise per Session: 60 min  Stress: No Stress Concern Present   Feeling of Stress : Not at all  Social Connections: Socially Integrated   Frequency of Communication with Friends and Family: More than three times a week   Frequency of Social Gatherings with Friends and Family: More than three times a week   Attends Religious Services: More than 4 times per year   Active Member of [employer] or Organizations: Yes   Attends Music therapist: More than 4 times per year   Marital Status: Married   Outpatient Medications Prior to Visit  Medication Sig   acetaminophen (TYLENOL) 500 MG tablet Take 500 mg by mouth every 6 (six) hours as needed for moderate pain or headache.   Calcium Carb-Cholecalciferol (CALCIUM 500/D PO) Take 1 tablet by mouth daily. Vita fusion calcium 500 mg with 25 mcg of vitamin D   EPINEPHrine 0.3 mg/0.3 mL IJ SOAJ injection Inject 0.3 mg as directed as needed (allergic reaction).    ibuprofen (ADVIL) 600 MG tablet Take 1 tablet (600 mg total) by mouth every 6 (six) hours as needed for moderate pain. For AFTER surgery   letrozole (FEMARA) 2.5 MG tablet Take 1 tablet (2.5 mg total) by mouth daily.   Multiple Vitamins-Minerals (ADULT GUMMY PO) Take 2 capsules by mouth daily.   polyethylene glycol (MIRALAX / GLYCOLAX) 17 g packet Take 17 g by mouth daily.   senna-docusate (SENOKOT-S) 8.6-50 MG tablet Take 2 tablets by mouth at bedtime. For AFTER surgery, do not take if having diarrhea   SUMAtriptan (IMITREX) 100 MG tablet 1 tab with headache onset.  Can repeat in 2 hours.  Max dosage '200mg'$ /24 hours. (Patient taking differently: Take 100 mg by mouth every 2 (two) hours as needed for migraine. Can repeat in 2 hours.  Max dosage '200mg'$ /24 hours.)   [DISCONTINUED] senna (SENOKOT) 8.6 MG TABS tablet Take 17.2 mg by mouth daily.   [DISCONTINUED] dexamethasone (DECADRON) 4 MG tablet Take 2 tabs at the night  before and 2 tab the morning of chemotherapy, every 3 weeks, by mouth x 6 cycles (Patient taking differently: Take 8 mg by mouth See admin instructions. Take 8 mg at the night before and 8 mg the morning of chemotherapy, every 3 weeks, by mouth x 6 cycles)   [DISCONTINUED] enoxaparin (LOVENOX) 40 MG/0.4ML injection Inject 0.4 mLs (40 mg total) into the skin  daily for 28 days. For AFTER surgery only   [DISCONTINUED] prochlorperazine (COMPAZINE) 10 MG tablet Take 1 tablet (10 mg total) by mouth every 6 (six) hours as needed (Nausea or vomiting). (Patient not taking: No sig reported)   [DISCONTINUED] traMADol (ULTRAM) 50 MG tablet Take 1 tablet (50 mg total) by mouth every 6 (six) hours as needed for severe pain. For AFTER surgery only, do not take and drive   No facility-administered medications prior to visit.   Allergies  Allergen Reactions   Bee Venom Swelling   Latex Other (See Comments)    Redness, itching   Mushroom Extract Complex Nausea And Vomiting    Diarrhea   Penicillins Hives    Reaction:  70 years old   Tape Itching    Redness    Immunization History  Administered Date(s) Administered   Fluad Quad(high Dose 65+) 09/29/2019   Influenza Inj Mdck Quad Pf 10/07/2018   Influenza, High Dose Seasonal PF 09/29/2019   PFIZER Comirnaty(Gray Top)Covid-19 Tri-Sucrose Vaccine 12/22/2019, 01/12/2020, 10/09/2020   PFIZER(Purple Top)SARS-COV-2 Vaccination 12/22/2019, 01/12/2020   Pneumococcal Conjugate-13 09/20/2019, 09/21/2020   Tdap 08/15/2014    Health Maintenance  Topic Date Due   Zoster Vaccines- Shingrix (1 of 2) Never done   COVID-19 Vaccine (4 - Booster for Pfizer series) 01/01/2021   INFLUENZA VACCINE  09/16/2021 (Originally 07/01/2021)   PNA vac Low Risk Adult (2 of 2 - PPSV23) 09/21/2021   MAMMOGRAM  09/11/2022   TETANUS/TDAP  08/15/2024   COLONOSCOPY (Pts 45-65yr Insurance coverage will need to be confirmed)  01/03/2025   DEXA SCAN  Completed   Hepatitis C Screening   Completed   HPV VACCINES  Aged Out    Patient Care Team: Zigmond Trela, SCoralee Pesa NP as PCP - General (Nurse Practitioner) HJacqulyn Liner RN as Oncology Nurse Navigator (Oncology)  Review of Systems  Skin:  Positive for color change.  All other systems reviewed and are negative.    Objective    BP 137/80   Pulse 65   Ht '5\' 5"'$  (1.651 m)   Wt 122 lb 3.2 oz (55.4 kg)   LMP 10/31/2008   SpO2 100%   BMI 20.34 kg/m  Physical Exam Vitals and nursing note reviewed.  Constitutional:      Appearance: Normal appearance. She is normal weight.  Eyes:     Extraocular Movements: Extraocular movements intact.     Conjunctiva/sclera: Conjunctivae normal.     Pupils: Pupils are equal, round, and reactive to light.  Cardiovascular:     Rate and Rhythm: Normal rate and regular rhythm.     Pulses: Normal pulses.     Heart sounds: Normal heart sounds.  Pulmonary:     Effort: Pulmonary effort is normal.     Breath sounds: Normal breath sounds.  Abdominal:     General: Abdomen is flat. Bowel sounds are normal.     Palpations: Abdomen is soft.  Musculoskeletal:        General: Normal range of motion.     Cervical back: Normal range of motion.  Skin:    General: Skin is warm and dry.     Capillary Refill: Capillary refill takes less than 2 seconds.       Neurological:     General: No focal deficit present.     Mental Status: She is alert and oriented to person, place, and time.  Psychiatric:        Mood and Affect: Mood normal.  Behavior: Behavior normal.        Thought Content: Thought content normal.        Judgment: Judgment normal.    Depression Screen PHQ 2/9 Scores 08/12/2021  PHQ - 2 Score 0  PHQ- 9 Score 0   No results found for any visits on 08/12/21.  Assessment & Plan      Problem List Items Addressed This Visit     Screening for osteoporosis - Primary    DEXA order placed today for osteoporosis screening as requested by oncology.      Relevant Orders   DG Bone  Density   Actinic keratosis of scalp    Symptoms and presentation consistent with actinic keratosis. Patient has appointment dermatology next month. Information provided on actinic keratosis recommend that she discuss with dermatology for most appropriate removal versus biopsy. No red flags present today.      Encounter for medical examination to establish care    Review of current and past medical history, social history, medication, and family history.  Review of care gaps and health maintenance recommendations.  Records from recent providers to be requested if not available in Chart Review or Care Everywhere.  Recommendations for health maintenance, diet, and exercise provided.  Follow-up in spring for annual physical exam.        Return in about 30 weeks (around 03/10/2022) for AMW and CPE.     Orma Render, NP  [facility] [phone] (phone) [phone] (fax)  [facility] Time: 45 minutes, >50% spent counseling, care coordination, chart review, and documentation.

## 2021-08-12 NOTE — Patient Instructions (Signed)
We will see you in the spring for your physical!   Please let me know if you have any needs in between now and then!

## 2021-08-12 NOTE — Assessment & Plan Note (Signed)
Review of current and past medical history, social history, medication, and family history.  Review of care gaps and health maintenance recommendations.  Records from recent providers to be requested if not available in Chart Review or Care Everywhere.  Recommendations for health maintenance, diet, and exercise provided.  Follow-up in spring for annual physical exam.

## 2021-08-12 NOTE — Assessment & Plan Note (Signed)
Symptoms and presentation consistent with actinic keratosis. Patient has appointment dermatology next month. Information provided on actinic keratosis recommend that she discuss with dermatology for most appropriate removal versus biopsy. No red flags present today.

## 2021-08-12 NOTE — Assessment & Plan Note (Signed)
DEXA order placed today for osteoporosis screening as requested by oncology.

## 2021-08-21 ENCOUNTER — Encounter (HOSPITAL_BASED_OUTPATIENT_CLINIC_OR_DEPARTMENT_OTHER): Payer: Self-pay | Admitting: Nurse Practitioner

## 2021-09-03 ENCOUNTER — Ambulatory Visit (HOSPITAL_BASED_OUTPATIENT_CLINIC_OR_DEPARTMENT_OTHER): Payer: Medicare HMO | Admitting: Nurse Practitioner

## 2021-09-09 ENCOUNTER — Encounter: Payer: Self-pay | Admitting: Gynecologic Oncology

## 2021-09-09 ENCOUNTER — Telehealth: Payer: Self-pay | Admitting: Oncology

## 2021-09-09 NOTE — Telephone Encounter (Signed)
Following up with Ms. Houseworth. Picture of incision sent via mychart assessed. Per Joylene John, NP incision looks good.  Instructed to call if she has any fevers, chills, drainage from incision, warmth, redness or swelling. Patient verbalized understanding and will call with any questions or concerns.
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
## 2021-09-15 ENCOUNTER — Encounter: Payer: Self-pay | Admitting: Gynecologic Oncology

## 2021-09-16 ENCOUNTER — Telehealth: Payer: Self-pay

## 2021-09-16 NOTE — Telephone Encounter (Signed)
Following up with Karen Winters regarding her mychart message. Patient states she has had intercourse 3 times since surgery. She is having vaginal pain during intercourse that feels like burning and has light spotting for about an hour afterwards. Patient reports using lubrication during intercourse. She denies spotting at any other time. She reports a sharp pain in her right side near her belly button that comes and goes.  Joylene John, NP notified. Instructed patient to continue to monitor symptoms. If she has any changes or concerns her appointment can moved up. She is currently scheduled to see Dr. Berline Lopes on 10/14/21. Patient verbalizes understanding and will call with questions or concerns.

## 2021-09-20 ENCOUNTER — Encounter (HOSPITAL_BASED_OUTPATIENT_CLINIC_OR_DEPARTMENT_OTHER): Payer: Self-pay | Admitting: Obstetrics & Gynecology

## 2021-10-02 ENCOUNTER — Inpatient Hospital Stay (HOSPITAL_BASED_OUTPATIENT_CLINIC_OR_DEPARTMENT_OTHER): Admission: RE | Admit: 2021-10-02 | Payer: Medicare HMO | Source: Ambulatory Visit

## 2021-10-07 ENCOUNTER — Ambulatory Visit (HOSPITAL_BASED_OUTPATIENT_CLINIC_OR_DEPARTMENT_OTHER)
Admission: RE | Admit: 2021-10-07 | Discharge: 2021-10-07 | Disposition: A | Discharge status: Home / Self Care | Payer: Medicare HMO | Source: Ambulatory Visit | Attending: Nurse Practitioner | Admitting: Nurse Practitioner

## 2021-10-07 ENCOUNTER — Other Ambulatory Visit: Payer: Self-pay

## 2021-10-07 ENCOUNTER — Encounter: Payer: Self-pay | Admitting: Gynecologic Oncology

## 2021-10-07 ENCOUNTER — Encounter (HOSPITAL_BASED_OUTPATIENT_CLINIC_OR_DEPARTMENT_OTHER): Payer: Self-pay

## 2021-10-07 DIAGNOSIS — Z13820 Encounter for screening for osteoporosis: Secondary | ICD-10-CM

## 2021-10-08 ENCOUNTER — Telehealth: Payer: Self-pay | Admitting: Oncology

## 2021-10-08 NOTE — Telephone Encounter (Signed)
Left a message regarding bone density testing recommendation for every 2 years.  Requested a return call with any questions.

## 2021-10-09 ENCOUNTER — Encounter: Payer: Self-pay | Admitting: Gynecologic Oncology

## 2021-10-10 ENCOUNTER — Telehealth: Payer: Self-pay | Admitting: Oncology

## 2021-10-10 NOTE — Telephone Encounter (Signed)
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  She said she wants to hold off for now because she is managing it through her diet and with calcium tablets.

## 2021-10-14 ENCOUNTER — Encounter: Payer: Self-pay | Admitting: Oncology

## 2021-10-14 ENCOUNTER — Encounter: Payer: Self-pay | Admitting: Gynecologic Oncology

## 2021-10-14 ENCOUNTER — Other Ambulatory Visit: Payer: Self-pay

## 2021-10-14 ENCOUNTER — Inpatient Hospital Stay: Payer: Medicare HMO | Attending: Gynecologic Oncology

## 2021-10-14 ENCOUNTER — Inpatient Hospital Stay: Payer: Medicare HMO | Attending: Hematology and Oncology | Admitting: Gynecologic Oncology

## 2021-10-14 VITALS — BP 139/88 | HR 68 | Temp 97.6°F | Resp 18 | Ht 65.0 in | Wt 123.4 lb

## 2021-10-14 DIAGNOSIS — D3911 Neoplasm of uncertain behavior of right ovary: Secondary | ICD-10-CM | POA: Diagnosis not present

## 2021-10-14 DIAGNOSIS — Z79811 Long term (current) use of aromatase inhibitors: Secondary | ICD-10-CM | POA: Insufficient documentation

## 2021-10-14 DIAGNOSIS — Z90710 Acquired absence of both cervix and uterus: Secondary | ICD-10-CM | POA: Diagnosis not present

## 2021-10-14 DIAGNOSIS — M810 Age-related osteoporosis without current pathological fracture: Secondary | ICD-10-CM | POA: Diagnosis not present

## 2021-10-14 DIAGNOSIS — C563 Malignant neoplasm of bilateral ovaries: Secondary | ICD-10-CM | POA: Insufficient documentation

## 2021-10-14 DIAGNOSIS — Z90722 Acquired absence of ovaries, bilateral: Secondary | ICD-10-CM | POA: Diagnosis not present

## 2021-10-14 DIAGNOSIS — D3910 Neoplasm of uncertain behavior of unspecified ovary: Secondary | ICD-10-CM

## 2021-10-14 NOTE — Progress Notes (Signed)
Referral per Dr. 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

## 2021-10-14 NOTE — Progress Notes (Signed)
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  She was scheduled to see a surgeon about possible repair.  She also endorses intermittent pain in around the area of her right hip that she thought was secondary to her exercise routine.  Over the last week, she has had a decreased appetite.  She denies any nausea or emesis but endorses several months of abdominal bloating and early satiety.  She notes normal bowel function, which she describes as bowel movements at least daily.  She has had some increased urinary frequency and around [place] developed stress urinary incontinence when she runs.     04/04/2021 Imaging   US pelvis  Septate uterus, otherwise unremarkable.   Normal endometrial complex.   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   Recommend further assessment by CT imaging with IV and oral contrast.   04/05/2021 Tumor Marker   Patient's tumor was tested for the following markers: CA-125 Results of the tumor marker test revealed 826.   04/05/2021 Imaging   1. Large bilateral solid-appearing adnexal masses are identified concerning for ovarian neoplasm. There is soft tissue infiltration into the surrounding peritoneal cavity which partially encases nonobstructed loops of small bowel with probable serosal involvement. 2. Moderate volume of ascites identified within the abdomen and pelvis likely secondary to peritoneal carcinomatosis. Diagnostic paracentesis may be helpful for further workup. 3. Mild increased caliber of small bowel loops with a few air-fluid levels. However, there are no signs to suggest a bowel  obstruction as enteric contrast material is noted up to the level of the hepatic flexure.   04/10/2021 Initial Diagnosis   Ovarian cancer ([place])   04/10/2021 Procedure   Successful ultrasound-guided paracentesis yielding 1.4 liters of peritoneal fluid.   04/11/2021 Cancer Staging   Staging form: Ovary, Fallopian Tube, and Primary Peritoneal Carcinoma, AJCC 8th Edition - Clinical stage from 04/11/2021: Stage IIIC (cT3c, cN0, cM0) - Signed by Heath Lark, MD on 04/11/2021 Stage prefix: Initial diagnosis    04/18/2021 Tumor Marker   Patient's tumor was tested for the following markers: CA-125. Results of the tumor marker test revealed 771   04/22/2021 - 06/05/2021 Chemotherapy          05/14/2021 Tumor Marker   Patient's tumor was tested for the following markers: CA-125. Results of the tumor marker test revealed 877.   06/10/2021 Imaging   1. Today's study demonstrates a centrally stable disease when compared to prior examination from 04/05/2021, with large bilateral ovarian lesions and moderate volume of malignant ascites, as detailed above. 2. Trace right pleural effusion lying dependently.     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  Ascites: approximately 2L upon entry and produced during surgery Operative Findings: Small mobile uterus, nodular masses filling the cul de sac. On intra-abdominal  entry, approximately 1.6L of green-tinged ascites was encountered. Normal liver edge, diaphragm, and stomach. Infracolic omentum with a [id number] nodular area and several smaller (1-3cm) deposits c/w tumor. Otherwise, omentum without evidence of disease. Small fibrinous debris on the small bowel, no obvious tumor implants. Mesentery free of disease. No appreciable adenopathy. Bilateral ovaries replaced with vesicular tumors (L>R), measuring approximately 8cm and 12cm. Several areas of plaque (appeared more consistent with treated tumor than active cancer - right pelvic sidewall, posterior cul de sac, and anterior cul de sac) noted, most excised. Uterus 4-6cm in size. Dense adhesions of the bladder to the LUS and cervix, likely combination of prior c-section and treatment effect. R0 resection at the end of surgery if peritoneal plaques not c/w tumor. No clear umbilical hernia.    07/02/2021 Pathology Results   A. 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   - Surface involvement present.  - Fallopian tube involvement present, non-invasive.   B. OVARY AND FALLOPIAN TUBE, LEFT, SALPINGO OOPHORECTOMY:  - Serous borderline tumor.  - Surface involvement present, non-invasive.  - Fallopian tube involvement present, non-invasive.   C. UTERUS AND CERVIX, HYSTERECTOMY:  - Uterus:       Endometrium: Benign endometrial type polyp. Inactive endometrium.  No hyperplasia or malignancy.       Myometrium: Unremarkable. No malignancy.       Serosa: Invasive serous implant.  - Cervix: Benign squamous and endocervical mucosa. No dysplasia or  malignancy.  - Bilateral fallopian tubes: Surface involvement present.   D. OMENTUM:  - Non-invasive serous implant.  - One of one lymph nodes negative for carcinoma (0/1).   ONCOLOGY TABLE:   OVARY or FALLOPIAN TUBE or PRIMARY PERITONEUM: Resection   Procedure: Hysterectomy with  bilateral salpingo-oophorectomy and omental  resection.  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  Other Tissue/ Organ Involvement: Not applicable  Largest Extrapelvic Peritoneal Focus: 2 mm  Peritoneal/Ascitic Fluid Involvement: Not applicable  Chemotherapy Response Score (CRS): Cannot be determined  Regional Lymph Nodes:       Number of Nodes with Metastasis Greater than 10 mm: 0       Number of Nodes with Metastasis 10 mm or Less (excludes isolated  tumor cells): 0       Number of Nodes with Isolated Tumor Cells (0.2 mm or less): 0       Number of Lymph Nodes Examined: 1  Distant Metastasis:       Distant Site(s) Involved: Not applicable  Pathologic Stage Classification (pTNM, AJCC 8th Edition): ypT3a, ypN0  Ancillary Studies: Can be performed upon request  Representative Tumor Block: A7  Comment(s): Given extensive involvement and neoadjuvant therapy it is  difficult to determine the exact primary location, but the right ovary  is slightly favored. Dr. Saralyn Pilar has reviewed the case.    07/02/2021 Pathology Results   FINAL MICROSCOPIC DIAGNOSIS:   A. OVARY AND FALLOPIAN TUBE, RIGHT, SALPINGO OOPHORECTOMY:  - Focal low grade  serous carcinoma arising in a serous borderline tumor.   - Surface involvement present.  - Fallopian tube involvement present, non-invasive.   B. OVARY AND FALLOPIAN TUBE, LEFT, SALPINGO OOPHORECTOMY:  - Serous borderline tumor.  - Surface involvement present, non-invasive.  - Fallopian tube involvement present, non-invasive.   C. UTERUS AND  CERVIX, HYSTERECTOMY:  - Uterus:       Endometrium: Benign endometrial type polyp. Inactive endometrium.  No hyperplasia or malignancy.       Myometrium: Unremarkable. No malignancy.       Serosa: Invasive serous implant.  - Cervix: Benign squamous and endocervical mucosa. No dysplasia or malignancy.  - Bilateral fallopian tubes: Surface involvement present.   D. OMENTUM:  - Non-invasive serous implant.  - One of one lymph nodes negative for carcinoma (0/1).   ONCOLOGY TABLE:   OVARY or FALLOPIAN TUBE or PRIMARY PERITONEUM: Resection   Procedure: Hysterectomy with bilateral salpingo-oophorectomy and omental resection.  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  Other Tissue/ Organ Involvement: Not applicable  Largest Extrapelvic Peritoneal Focus: 2 mm  Peritoneal/Ascitic Fluid Involvement: Not applicable  Chemotherapy Response Score (CRS): Cannot be determined  Regional Lymph Nodes:       Number of Nodes with Metastasis Greater than 10 mm: 0       Number of Nodes with Metastasis 10 mm or Less (excludes isolated  tumor cells): 0       Number of Nodes with Isolated Tumor Cells (0.2 mm or less): 0       Number of Lymph Nodes Examined: 1  Distant Metastasis:       Distant Site(s) Involved: Not applicable  Pathologic Stage Classification (pTNM, AJCC 8th Edition): ypT3a, ypN0  Ancillary Studies: Can be performed upon request  Representative Tumor Block: A7  Comment(s): Given extensive involvement and neoadjuvant therapy it is difficult to determine the exact primary location,  but the right ovary is slightly favored. Dr. Saralyn Pilar has reviewed the case.    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     Ascites: approximately 2L upon entry and produced during surgery Operative Findings: Small mobile uterus, nodular masses filling the cul de sac. On intra-abdominal entry, approximately 1.6L of green-tinged ascites was encountered. Normal liver edge, diaphragm, and stomach. Infracolic omentum with a 6B3AL nodular area and several smaller (1-3cm) deposits c/w tumor. Otherwise, omentum without evidence of disease. Small fibrinous debris on the small bowel, no obvious tumor implants. Mesentery free of disease. No appreciable adenopathy. Bilateral ovaries replaced with vesicular tumors (L>R), measuring approximately 8cm and 12cm. Several areas of plaque (appeared more consistent with treated tumor than active cancer - right pelvic sidewall,  posterior cul de sac, and anterior cul de sac) noted, most excised. Uterus 4-6cm in size. Dense adhesions of the bladder to the LUS and cervix, likely combination of prior c-section and treatment effect. R0 resection at the end of surgery if peritoneal plaques not c/w tumor. No clear umbilical hernia.    07/17/2021 Tumor Marker   Patient's tumor was tested for the following markers: CA-125. Results of the tumor marker test revealed 116.    08/06/2021 Genetic Testing   Negative genetic testing: no pathogenic variants detected in [facility] TumorNext-HRD + CancerNext Panel.  The report date is August 06, 2021.    The CancerNext gene panel offered by [employer] includes sequencing and rearrangement analysis for the following 37 genes:   APC, ATM, AXIN2 BARD1, BMPR1A, BRCA1, BRCA2, BRIP1, CDH1, CDK4, CDKN2A, CHEK2, DICER1, HOXB13, EPCAM, GREM1, MLH1, MRE11A, MSH2, MSH3, MSH6, MUTYH, NBN, NF1, PALB2, PMS2, POLD1, POLE, PTEN, RAD50, RAD51C, RAD51D, RECQL, SMAD4, SMARCA4, STK11, and TP53. TumorNext-HRD is a paired tumor and germline analysis of BRCA1 and BRCA2 plus 9 additional genes in the homologous recombination repair pathway (ATM, BARD1, BRCA1, BRCA2, BRIP1, CHEK2, MRE11A, NBN, PALB2, RAD51C, RAD51D).     Interval History: Patient presents today for follow-up visit.  She notes overall doing well.  She denies any abdominal or pelvic pain.  She had some back pain since her last visit after lifting some logs with her husband because of several trees that fell down in their yard.  She is back to running and ran 7 miles yesterday.  Energy has improved.  She notes normal bowel and bladder function.  She is continuing to eat a protein rich diet.  She has been very good about taking daily calcium and vitamin D as well as a multivitamin.  She denies any significant side effects related to starting letrozole.  [facility] test results found no pathogenic mutations of germline or somatic origin.  Updated mammograph on 09/17/21.  Past Medical/Surgical History: Past Medical History:  Diagnosis Date   Anemia    Bronchitis    Cancer ([place])    Complication of anesthesia    Endometriosis    Family history of renal cancer 05/09/2021   History of anemia    Migraines    Osteoporosis    PONV (postoperative nausea and vomiting)     Past Surgical History:  Procedure Laterality Date   [place]   and [facility]   COLONOSCOPY      DEBULKING N/A 07/02/2021   Procedure: TUMOR DEBULKING;  Surgeon: Lafonda Mosses, MD;  Location: [facility];  Service: Gynecology;  Laterality: N/A;   HYSTERECTOMY ABDOMINAL WITH SALPINGO-OOPHORECTOMY N/A 07/02/2021   Procedure: HYSTERECTOMY ABDOMINAL WITH BILATERAL SALPINGO-OOPHORECTOMY; OMENTECTOMY;  Surgeon: Lafonda Mosses, MD;  Location: [facility];  Service: Gynecology;  Laterality: N/A;   LAPAROSCOPY     for infertility-endometriosis   LAPAROSCOPY     2nd for infertility   mole removed     precancerous mole right upper abd   ORIF WRIST FRACTURE Right 08/02/2020   Procedure: Right distal radius open reduction internal fixation;  Surgeon: Verner Mould, MD;  Location: [place];  Service: Orthopedics;  Laterality: Right;  45min   TONSILLECTOMY AND ADENOIDECTOMY      Family History  Problem Relation Age of Onset   Renal cancer Father 84       Agent Orange exposure   Hypertension Sister    Hypothyroidism Sister    Other Sister  Wegener's syndrome   Hypertension Brother    Diabetes Maternal Grandmother    Heart attack Paternal Grandfather     Social History   Socioeconomic History   Marital status: Married    Spouse name: Phil   Number of children: 2   Years of education: Not on file   Highest education level: Not on file  Occupational History   Occupation: retired  Tobacco Use   Smoking status: Former    Types: Cigarettes    Quit date: 12/02/1983    Years since quitting: 37.8   Smokeless tobacco: Never   Tobacco comments:    quit 1985  Vaping Use   Vaping Use: Never used  Substance and Sexual Activity   Alcohol use: Not Currently    Comment: ocassional   Drug use: No   Sexual activity: Yes    Partners: Male    Birth control/protection: Surgical, Post-menopausal    Comment: BTL  Other Topics Concern   Not on file  Social History Narrative   Not on file   Social Determinants of Health   Financial Resource Strain: Not on file  Food Insecurity: Not on  file  Transportation Needs: No Transportation Needs   Lack of Transportation (Medical): No   Lack of Transportation (Non-Medical): No  Physical Activity: Sufficiently Active   Days of Exercise per Week: 4 days   Minutes of Exercise per Session: 60 min  Stress: No Stress Concern Present   Feeling of Stress : Not at all  Social Connections: Socially Integrated   Frequency of Communication with Friends and Family: More than three times a week   Frequency of Social Gatherings with Friends and Family: More than three times a week   Attends Religious Services: More than 4 times per year   Active Member of [employer] or Organizations: Yes   Attends Music therapist: More than 4 times per year   Marital Status: Married    Current Medications:  Current Outpatient Medications:    acetaminophen (TYLENOL) 500 MG tablet, Take 500 mg by mouth every 6 (six) hours as needed for moderate pain or headache., Disp: , Rfl:    Boswellia-Glucosamine-Vit D (OSTEO BI-FLEX ONE PER DAY PO), Take by mouth daily., Disp: , Rfl:    Calcium Carb-Cholecalciferol (CALCIUM 500/D PO), Take 1 tablet by mouth daily. Vita fusion calcium 500 mg with 25 mcg of vitamin D, Disp: , Rfl:    EPINEPHrine 0.3 mg/0.3 mL IJ SOAJ injection, Inject 0.3 mg as directed as needed (allergic reaction). , Disp: , Rfl:    ibuprofen (ADVIL) 600 MG tablet, Take 1 tablet (600 mg total) by mouth every 6 (six) hours as needed for moderate pain. For AFTER surgery, Disp: 30 tablet, Rfl: 0   letrozole (FEMARA) 2.5 MG tablet, Take 1 tablet (2.5 mg total) by mouth daily., Disp: 60 tablet, Rfl: 6   Multiple Vitamins-Minerals (ADULT GUMMY PO), Take 2 capsules by mouth daily., Disp: , Rfl:    polyethylene glycol (MIRALAX / GLYCOLAX) 17 g packet, Take 17 g by mouth daily., Disp: , Rfl:    senna-docusate (SENOKOT-S) 8.6-50 MG tablet, Take 2 tablets by mouth at bedtime. For AFTER surgery, do not take if having diarrhea, Disp: 30 tablet, Rfl: 0    SUMAtriptan (IMITREX) 100 MG tablet, 1 tab with headache onset.  Can repeat in 2 hours.  Max dosage $RemoveBe'200mg'szQwJELgM$ /24 hours. (Patient taking differently: Take 100 mg by mouth every 2 (two) hours as needed for migraine. Can repeat in 2  hours.  Max dosage $RemoveBe'200mg'crSRcccbg$ /24 hours.), Disp: 27 tablet, Rfl: 4  Review of Systems: Denies appetite changes, fevers, chills, fatigue, unexplained weight changes. Denies hearing loss, neck lumps or masses, mouth sores, ringing in ears or voice changes. Denies cough or wheezing.  Denies shortness of breath. Denies chest pain or palpitations. Denies leg swelling. Denies abdominal distention, pain, blood in stools, constipation, diarrhea, nausea, vomiting, or early satiety. Denies pain with intercourse, dysuria, frequency, hematuria or incontinence. Denies hot flashes, pelvic pain, vaginal bleeding or vaginal discharge.   Denies joint pain, back pain or muscle pain/cramps. Denies itching, rash, or wounds. Denies dizziness, headaches, numbness or seizures. Denies swollen lymph nodes or glands, denies easy bruising or bleeding. Denies anxiety, depression, confusion, or decreased concentration.  Physical Exam: BP 139/88 (BP Location: Left Arm, Patient Position: Sitting)   Pulse 68   Temp 97.6 F (36.4 C) (Tympanic)   Resp 18   Ht $R'5\' 5"'jf$  (1.651 m)   Wt 123 lb 6.4 oz (56 kg)   LMP 10/31/2008   SpO2 99%   BMI 20.53 kg/m  General: Alert, oriented, no acute distress. HEENT: Normocephalic, atraumatic, sclera anicteric. Chest: Clear to auscultation bilaterally.  No wheezes or rhonchi. Cardiovascular: Regular rate and rhythm, no murmurs. Abdomen: soft, nontender.  Normoactive bowel sounds.  No masses or hepatosplenomegaly appreciated.  Well-healed scar. Extremities: Grossly normal range of motion.  Warm, well perfused.  No edema bilaterally. Skin: No rashes or lesions noted. Lymphatics: No cervical, supraclavicular, or inguinal adenopathy. GU: Normal appearing external  genitalia without erythema, excoriation, or lesions.  Speculum exam reveals moderately atrophic vaginal mucosa, no lesions or masses.  No bleeding or discharge.  Bimanual exam reveals cuff intact, no nodularity.  Rectovaginal exam confirms these findings.  Laboratory & Radiologic Studies: CA-125 on 8/17: 116  Assessment & Plan: Karen Winters is a 70 y.o. 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  Patient is overall doing very well and is NED on exam.  We will plan to get a CA-125.  She is on letrozole for maintenance hormonal therapy.  She is tolerating this well without side effects.  Given recent bone density showing osteoporosis, we had previously discussed referral to endocrinology to assess whether the patient would benefit from any therapy other than calcium and vitamin D supplementation.  After discussion today, patient is amenable to referral to them.    Genetic testing shows no somatic or germline mutations.  Plan to see the patient for her next surveillance visit in 3 months.  We reviewed signs and symptoms that should prompt a phone call to be seen sooner than that visit.  32 minutes of total time was spent for this patient encounter, including preparation, face-to-face counseling with the patient and coordination of care, and documentation of the encounter.  Jeral Pinch, MD  Division of Gynecologic Oncology  Department of Obstetrics and Gynecology  [facility]

## 2021-10-14 NOTE — Patient Instructions (Signed)
Was great to see you today!  You look wonderful.  I do not see or feel any evidence of cancer recurrence.  I will release your CA-125 to you when it results tomorrow.  Santiago Glad will work to get you scheduled with endocrinology to discuss any additional steps we need to do related to decreasing bone loss.  I will see you back for a visit in 3 months.  Please call if anything changes before your next visit.

## 2021-10-15 ENCOUNTER — Ambulatory Visit: Payer: Medicare HMO | Admitting: Gynecologic Oncology

## 2021-10-15 LAB — CA 125: Cancer Antigen (CA) 125: 11.9 U/mL (ref 0.0–38.1)

## 2021-10-22 ENCOUNTER — Telehealth: Payer: Self-pay | Admitting: Oncology

## 2021-10-22 ENCOUNTER — Encounter: Payer: Self-pay | Admitting: Gynecologic Oncology

## 2021-10-22 NOTE — Telephone Encounter (Signed)
Called Karen Winters and advised that I will try to contact [facility] to schedule her appointment.  She is also going to try to schedule online and will let us know if she has an appointment.

## 2021-11-18 ENCOUNTER — Encounter: Payer: Self-pay | Admitting: Oncology

## 2021-11-18 DIAGNOSIS — M810 Age-related osteoporosis without current pathological fracture: Secondary | ICD-10-CM

## 2021-11-18 NOTE — Progress Notes (Signed)
Called to check on status of endocrinology referral with Vaughan Basta at [facility].  The referral needs to be changed to an osteoporosis diagnosis.  New order entered and Vaughan Basta has been notified and it has been placed in review.

## 2022-01-06 ENCOUNTER — Encounter: Payer: Self-pay | Admitting: Endocrinology

## 2022-01-06 ENCOUNTER — Other Ambulatory Visit: Payer: Self-pay

## 2022-01-06 ENCOUNTER — Ambulatory Visit: Payer: Medicare HMO | Admitting: Endocrinology

## 2022-01-06 DIAGNOSIS — M810 Age-related osteoporosis without current pathological fracture: Secondary | ICD-10-CM | POA: Insufficient documentation

## 2022-01-06 NOTE — Progress Notes (Signed)
Subjective:    Patient ID: Karen Winters, female    DOB: 01-09-1951, 71 y.o.   MRN: [id number]  HPI Pt is referred by Joylene John, NP, for osteoporosis.  Pt was noted to have osteoporosis in 2021.  She has never been on medication for this.  she has had these bony fractures (right [id number]), left wrist (2002), right wrist (2021).  All were traumatic.  She has no history of any of the following: early menopause, multiple myeloma, renal dz, thyroid problems, steroids, alcoholism, liver dz, gastric bypass, and hyperparathyroidism.  She does not take heparin or anticonvulsants.  Pt says she was anovulatory until age 31.  She quit smoking in 1985, after smoking x 14 years.  She takes OTC Vit-D, uncertain dosage.  Oncol has questioned the use of Femara in this setting.  She has been on this since mid-2022.  Pt says she is very physically active.  Pt declines bis-phosphonates, as she says these increase the risk of fracture.    Past Medical History:  Diagnosis Date   Anemia    Bronchitis    Cancer ([place])    Complication of anesthesia    Endometriosis    Family history of renal cancer 05/09/2021   History of anemia    Migraines    Osteoporosis    PONV (postoperative nausea and vomiting)     Past Surgical History:  Procedure Laterality Date   [place]   and [facility]   COLONOSCOPY     DEBULKING N/A 07/02/2021   Procedure: TUMOR DEBULKING;  Surgeon: Lafonda Mosses, MD;  Location: [facility];  Service: Gynecology;  Laterality: N/A;   HYSTERECTOMY ABDOMINAL WITH SALPINGO-OOPHORECTOMY N/A 07/02/2021   Procedure: HYSTERECTOMY ABDOMINAL WITH BILATERAL SALPINGO-OOPHORECTOMY; OMENTECTOMY;  Surgeon: Lafonda Mosses, MD;  Location: [facility];  Service: Gynecology;  Laterality: N/A;   LAPAROSCOPY     for infertility-endometriosis   LAPAROSCOPY     2nd for infertility   mole removed     precancerous mole right upper abd   ORIF WRIST FRACTURE Right 08/02/2020   Procedure: Right  distal radius open reduction internal fixation;  Surgeon: Verner Mould, MD;  Location: [facility];  Service: Orthopedics;  Laterality: Right;  4mn   TONSILLECTOMY AND ADENOIDECTOMY      Social History   Socioeconomic History   Marital status: Married    Spouse name: Phil   Number of children: 2   Years of education: Not on file   Highest education level: Not on file  Occupational History   Occupation: retired  Tobacco Use   Smoking status: Former    Types: Cigarettes    Quit date: 12/02/1983    Years since quitting: 38.1   Smokeless tobacco: Never   Tobacco comments:    quit 1985  Vaping Use   Vaping Use: Never used  Substance and Sexual Activity   Alcohol use: Not Currently    Comment: ocassional   Drug use: No   Sexual activity: Yes    Partners: Male    Birth control/protection: Surgical, Post-menopausal    Comment: BTL  Other Topics Concern   Not on file  Social History Narrative   Not on file   Social Determinants of Health   Financial Resource Strain: Not on file  Food Insecurity: Not on file  Transportation Needs: No Transportation Needs   Lack of Transportation (Medical): No   Lack of Transportation (Non-Medical): No  Physical Activity: Sufficiently Active  Days of Exercise per Week: 4 days   Minutes of Exercise per Session: 60 min  Stress: No Stress Concern Present   Feeling of Stress : Not at all  Social Connections: Socially Integrated   Frequency of Communication with Friends and Family: More than three times a week   Frequency of Social Gatherings with Friends and Family: More than three times a week   Attends Religious Services: More than 4 times per year   Active Member of [employer] or Organizations: Yes   Attends Music therapist: More than 4 times per year   Marital Status: Married  Human resources officer Violence: Not At Risk   Fear of Current or Ex-Partner: No   Emotionally Abused: No   Physically Abused: No   Sexually Abused: No     Current Outpatient Medications on File Prior to Visit  Medication Sig Dispense Refill   acetaminophen (TYLENOL) 500 MG tablet Take 500 mg by mouth every 6 (six) hours as needed for moderate pain or headache.     Boswellia-Glucosamine-Vit D (OSTEO BI-FLEX ONE PER DAY PO) Take by mouth daily.     EPINEPHrine 0.3 mg/0.3 mL IJ SOAJ injection Inject 0.3 mg as directed as needed (allergic reaction).      ibuprofen (ADVIL) 600 MG tablet Take 1 tablet (600 mg total) by mouth every 6 (six) hours as needed for moderate pain. For AFTER surgery 30 tablet 0   letrozole (FEMARA) 2.5 MG tablet Take 1 tablet (2.5 mg total) by mouth daily. 60 tablet 6   Multiple Vitamins-Minerals (ADULT GUMMY PO) Take 2 capsules by mouth daily.     polyethylene glycol (MIRALAX / GLYCOLAX) 17 g packet Take 17 g by mouth daily.     senna-docusate (SENOKOT-S) 8.6-50 MG tablet Take 2 tablets by mouth at bedtime. For AFTER surgery, do not take if having diarrhea 30 tablet 0   SUMAtriptan (IMITREX) 100 MG tablet 1 tab with headache onset.  Can repeat in 2 hours.  Max dosage 297m/24 hours. (Patient taking differently: Take 100 mg by mouth every 2 (two) hours as needed for migraine. Can repeat in 2 hours.  Max dosage 2063m24 hours.) 27 tablet 4   Calcium Carb-Cholecalciferol (CALCIUM 500/D PO) Take 1 tablet by mouth daily. Vita fusion calcium 500 mg with 25 mcg of vitamin D     No current facility-administered medications on file prior to visit.    Allergies  Allergen Reactions   Bee Venom Swelling   Latex Other (See Comments)    Redness, itching   Mushroom Extract Complex Nausea And Vomiting    Diarrhea   Penicillins Hives    Reaction:  [age]ears old   Tape Itching    Redness    Family History  Problem Relation Age of Onset   Renal cancer Father [id number]     Agent Orange exposure   Hypertension Sister    Hypothyroidism Sister    Other Sister        Wegener's syndrome   Hypertension Brother    Diabetes Maternal  Grandmother    Heart attack Paternal Grandfather    Osteoporosis Other     BP 110/84    Pulse 68    Ht 5' 5"  (1.651 m)    Wt 127 lb 12.8 oz (58 kg)    LMP 10/31/2008    SpO2 99%    BMI 21.27 kg/m     Review of Systems denies weight loss, heartburn, cold intolerance, falls, memory loss, and  back pain.      Objective:   Physical Exam VITAL SIGNS:  See vs page.   GENERAL: no distress.   NECK: There is no palpable thyroid enlargement.  No thyroid nodule is palpable.  No palpable lymphadenopathy at the anterior neck.   SPINE: no kyphosis.   GAIT: normal and steady.    DEXA (2021) (RFN) -2.7  25-OH Vit-D=51  TSH=1.8 (2020)  Lab Results  Component Value Date   CREATININE 0.77 07/17/2021   BUN 17 07/17/2021   NA 140 07/17/2021   K 4.1 07/17/2021   CL 104 07/17/2021   CO2 25 07/17/2021   Lab Results  Component Value Date   ALT 20 07/17/2021   AST 17 07/17/2021   ALKPHOS 100 07/17/2021   BILITOT 0.3 07/17/2021   I have reviewed outside records, and summarized: Pt was noted to have osteoporosis, and referred here.  She presented in 2022 with several abd sxs, and was found to have pelvic mass.  She currently takes letrozole.       Assessment & Plan:  Osteoporosis, uncontrolled.  We discussed bisphonate fracture data:  benefit >>> risk.  We also discussed the fact that letrozole could worsen bone density.  However I don't know enough about its benefit to advise about risk/benefit of this med.  Patient Instructions  Please discuss the letrozole with your cancer doctor.  However, you are at risk for more broken bones, so you should take some action to reduce your risk.   You should also have your thyroid blood checked, the next time you have blood drawn.   I would be happy to see you back here as needed.

## 2022-01-06 NOTE — Patient Instructions (Addendum)
Please discuss the letrozole with your cancer doctor.  However, you are at risk for more broken bones, so you should take some action to reduce your risk.   You should also have your thyroid blood checked, the next time you have blood drawn.   I would be happy to see you back here as needed.

## 2022-01-10 ENCOUNTER — Telehealth: Payer: Self-pay

## 2022-01-10 NOTE — Telephone Encounter (Signed)
Karen Winters over at [facility] oncology Dr. Berline Lopes office has been informed of this

## 2022-01-10 NOTE — Telephone Encounter (Signed)
Bella Nya Monds calling from [place] oncology Dr. Berline Lopes office and wanting to know what the treatment plan is for pt since there is no medication prescribed from you.  [place].  Leamon Arnt

## 2022-01-24 ENCOUNTER — Other Ambulatory Visit: Payer: Self-pay | Admitting: Gynecologic Oncology

## 2022-01-24 DIAGNOSIS — D3910 Neoplasm of uncertain behavior of unspecified ovary: Secondary | ICD-10-CM

## 2022-01-27 ENCOUNTER — Inpatient Hospital Stay: Payer: Medicare HMO | Attending: Gynecologic Oncology

## 2022-01-27 ENCOUNTER — Encounter: Payer: Self-pay | Admitting: Gynecologic Oncology

## 2022-01-27 ENCOUNTER — Inpatient Hospital Stay (HOSPITAL_BASED_OUTPATIENT_CLINIC_OR_DEPARTMENT_OTHER): Payer: Medicare HMO | Admitting: Gynecologic Oncology

## 2022-01-27 ENCOUNTER — Other Ambulatory Visit: Payer: Self-pay

## 2022-01-27 VITALS — BP 134/91 | HR 82 | Temp 97.6°F | Resp 16 | Ht 64.96 in | Wt 125.2 lb

## 2022-01-27 DIAGNOSIS — M810 Age-related osteoporosis without current pathological fracture: Secondary | ICD-10-CM | POA: Diagnosis not present

## 2022-01-27 DIAGNOSIS — Z90710 Acquired absence of both cervix and uterus: Secondary | ICD-10-CM | POA: Diagnosis not present

## 2022-01-27 DIAGNOSIS — Z9221 Personal history of antineoplastic chemotherapy: Secondary | ICD-10-CM | POA: Insufficient documentation

## 2022-01-27 DIAGNOSIS — D3912 Neoplasm of uncertain behavior of left ovary: Secondary | ICD-10-CM | POA: Insufficient documentation

## 2022-01-27 DIAGNOSIS — M818 Other osteoporosis without current pathological fracture: Secondary | ICD-10-CM

## 2022-01-27 DIAGNOSIS — Z90722 Acquired absence of ovaries, bilateral: Secondary | ICD-10-CM | POA: Diagnosis not present

## 2022-01-27 DIAGNOSIS — Z9079 Acquired absence of other genital organ(s): Secondary | ICD-10-CM | POA: Insufficient documentation

## 2022-01-27 DIAGNOSIS — C561 Malignant neoplasm of right ovary: Secondary | ICD-10-CM

## 2022-01-27 DIAGNOSIS — C569 Malignant neoplasm of unspecified ovary: Secondary | ICD-10-CM

## 2022-01-27 DIAGNOSIS — D3910 Neoplasm of uncertain behavior of unspecified ovary: Secondary | ICD-10-CM

## 2022-01-27 DIAGNOSIS — Z79811 Long term (current) use of aromatase inhibitors: Secondary | ICD-10-CM | POA: Insufficient documentation

## 2022-01-27 NOTE — Progress Notes (Signed)
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  She was scheduled to see a surgeon about possible repair.  She also endorses intermittent pain in around the area of her right hip that she thought was secondary to her exercise routine.  Over the last week, she has had a decreased appetite.  She denies any nausea or emesis but endorses several months of abdominal bloating and early satiety.  She notes normal bowel function, which she describes as bowel movements at least daily.  She has had some increased urinary frequency and around Karen Winters developed stress urinary incontinence when she runs.     04/04/2021 Imaging   US pelvis  Septate uterus, otherwise unremarkable.   Normal endometrial complex.   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   Recommend further assessment by CT imaging with IV and oral contrast.   04/05/2021 Tumor Marker   Patient's tumor was tested for the following markers: CA-125 Results of the tumor marker test revealed 826.   04/05/2021 Imaging   1. Large bilateral solid-appearing adnexal masses are identified concerning for ovarian neoplasm. There is soft tissue infiltration into the surrounding peritoneal cavity which partially encases nonobstructed loops of small bowel with probable serosal involvement. 2. Moderate volume of ascites identified within the abdomen and pelvis likely secondary to peritoneal carcinomatosis. Diagnostic paracentesis may be helpful for further workup. 3. Mild increased caliber of small bowel loops with a few air-fluid levels. However, there are no signs to suggest a bowel  obstruction as enteric contrast material is noted up to the level of the hepatic flexure.   04/10/2021 Initial Diagnosis   Ovarian cancer ([place])   04/10/2021 Procedure   Successful ultrasound-guided paracentesis yielding 1.4 liters of peritoneal fluid.   04/11/2021 Cancer Staging   Staging form: Ovary, Fallopian Tube, and Primary Peritoneal Carcinoma, AJCC 8th Edition - Clinical stage from 04/11/2021: Stage IIIC (cT3c, cN0, cM0) - Signed by Heath Lark, MD on 04/11/2021 Stage prefix: Initial diagnosis    04/18/2021 Tumor Marker   Patient's tumor was tested for the following markers: CA-125. Results of the tumor marker test revealed 771   04/22/2021 - 06/05/2021 Chemotherapy          05/14/2021 Tumor Marker   Patient's tumor was tested for the following markers: CA-125. Results of the tumor marker test revealed 877.   06/10/2021 Imaging   1. Today's study demonstrates a centrally stable disease when compared to prior examination from 04/05/2021, with large bilateral ovarian lesions and moderate volume of malignant ascites, as detailed above. 2. Trace right pleural effusion lying dependently.     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  Ascites: approximately 2L upon entry and produced during surgery Operative Findings: Small mobile uterus, nodular masses filling the cul de sac. On intra-abdominal  entry, approximately 1.6L of green-tinged ascites was encountered. Normal liver edge, diaphragm, and stomach. Infracolic omentum with a 4H6PR nodular area and several smaller (1-3cm) deposits c/w tumor. Otherwise, omentum without evidence of disease. Small fibrinous debris on the small bowel, no obvious tumor implants. Mesentery free of disease. No appreciable adenopathy. Bilateral ovaries replaced with vesicular tumors (L>R), measuring approximately 8cm and 12cm. Several areas of plaque (appeared more consistent with treated tumor than active cancer - right pelvic sidewall, posterior cul de sac, and anterior cul de sac) noted, most excised. Uterus 4-6cm in size. Dense adhesions of the bladder to the LUS and cervix, likely combination of prior c-section and treatment effect. R0 resection at the end of surgery if peritoneal plaques not c/w tumor. No clear umbilical hernia.    07/02/2021 Pathology Results   A. 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   - Surface involvement present.  - Fallopian tube involvement present, non-invasive.   B. OVARY AND FALLOPIAN TUBE, LEFT, SALPINGO OOPHORECTOMY:  - Serous borderline tumor.  - Surface involvement present, non-invasive.  - Fallopian tube involvement present, non-invasive.   C. UTERUS AND CERVIX, HYSTERECTOMY:  - Uterus:       Endometrium: Benign endometrial type polyp. Inactive endometrium.  No hyperplasia or malignancy.       Myometrium: Unremarkable. No malignancy.       Serosa: Invasive serous implant.  - Cervix: Benign squamous and endocervical mucosa. No dysplasia or  malignancy.  - Bilateral fallopian tubes: Surface involvement present.   D. OMENTUM:  - Non-invasive serous implant.  - One of one lymph nodes negative for carcinoma (0/1).   ONCOLOGY TABLE:   OVARY or FALLOPIAN TUBE or PRIMARY PERITONEUM: Resection   Procedure: Hysterectomy with  bilateral salpingo-oophorectomy and omental  resection.  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  Other Tissue/ Organ Involvement: Not applicable  Largest Extrapelvic Peritoneal Focus: 2 mm  Peritoneal/Ascitic Fluid Involvement: Not applicable  Chemotherapy Response Score (CRS): Cannot be determined  Regional Lymph Nodes:       Number of Nodes with Metastasis Greater than 10 mm: 0       Number of Nodes with Metastasis 10 mm or Less (excludes isolated  tumor cells): 0       Number of Nodes with Isolated Tumor Cells (0.2 mm or less): 0       Number of Lymph Nodes Examined: 1  Distant Metastasis:       Distant Site(s) Involved: Not applicable  Pathologic Stage Classification (pTNM, AJCC 8th Edition): ypT3a, ypN0  Ancillary Studies: Can be performed upon request  Representative Tumor Block: A7  Comment(s): Given extensive involvement and neoadjuvant therapy it is  difficult to determine the exact primary location, but the right ovary  is slightly favored. Dr. Saralyn Pilar has reviewed the case.    07/02/2021 Pathology Results   FINAL MICROSCOPIC DIAGNOSIS:   A. OVARY AND FALLOPIAN TUBE, RIGHT, SALPINGO OOPHORECTOMY:  - Focal low grade  serous carcinoma arising in a serous borderline tumor.   - Surface involvement present.  - Fallopian tube involvement present, non-invasive.   B. OVARY AND FALLOPIAN TUBE, LEFT, SALPINGO OOPHORECTOMY:  - Serous borderline tumor.  - Surface involvement present, non-invasive.  - Fallopian tube involvement present, non-invasive.   C. UTERUS AND  CERVIX, HYSTERECTOMY:  - Uterus:       Endometrium: Benign endometrial type polyp. Inactive endometrium.  No hyperplasia or malignancy.       Myometrium: Unremarkable. No malignancy.       Serosa: Invasive serous implant.  - Cervix: Benign squamous and endocervical mucosa. No dysplasia or malignancy.  - Bilateral fallopian tubes: Surface involvement present.   D. OMENTUM:  - Non-invasive serous implant.  - One of one lymph nodes negative for carcinoma (0/1).   ONCOLOGY TABLE:   OVARY or FALLOPIAN TUBE or PRIMARY PERITONEUM: Resection   Procedure: Hysterectomy with bilateral salpingo-oophorectomy and omental resection.  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  Other Tissue/ Organ Involvement: Not applicable  Largest Extrapelvic Peritoneal Focus: 2 mm  Peritoneal/Ascitic Fluid Involvement: Not applicable  Chemotherapy Response Score (CRS): Cannot be determined  Regional Lymph Nodes:       Number of Nodes with Metastasis Greater than 10 mm: 0       Number of Nodes with Metastasis 10 mm or Less (excludes isolated  tumor cells): 0       Number of Nodes with Isolated Tumor Cells (0.2 mm or less): 0       Number of Lymph Nodes Examined: 1  Distant Metastasis:       Distant Site(s) Involved: Not applicable  Pathologic Stage Classification (pTNM, AJCC 8th Edition): ypT3a, ypN0  Ancillary Studies: Can be performed upon request  Representative Tumor Block: A7  Comment(s): Given extensive involvement and neoadjuvant therapy it is difficult to determine the exact primary location,  but the right ovary is slightly favored. Dr. Saralyn Pilar has reviewed the case.    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     Ascites: approximately 2L upon entry and produced during surgery Operative Findings: Small mobile uterus, nodular masses filling the cul de sac. On intra-abdominal entry, approximately 1.6L of green-tinged ascites was encountered. Normal liver edge, diaphragm, and stomach. Infracolic omentum with a 1E7NT nodular area and several smaller (1-3cm) deposits c/w tumor. Otherwise, omentum without evidence of disease. Small fibrinous debris on the small bowel, no obvious tumor implants. Mesentery free of disease. No appreciable adenopathy. Bilateral ovaries replaced with vesicular tumors (L>R), measuring approximately 8cm and 12cm. Several areas of plaque (appeared more consistent with treated tumor than active cancer - right pelvic sidewall,  posterior cul de sac, and anterior cul de sac) noted, most excised. Uterus 4-6cm in size. Dense adhesions of the bladder to the LUS and cervix, likely combination of prior c-section and treatment effect. R0 resection at the end of surgery if peritoneal plaques not c/w tumor. No clear umbilical hernia.    07/17/2021 Tumor Marker   Patient's tumor was tested for the following markers: CA-125. Results of the tumor marker test revealed 116.    08/06/2021 Genetic Testing   Negative genetic testing: no pathogenic variants detected in [facility] TumorNext-HRD + CancerNext Panel.  The report date is August 06, 2021.    The CancerNext gene panel offered by [employer] includes sequencing and rearrangement analysis for the following 37 genes:   APC, ATM, AXIN2 BARD1, BMPR1A, BRCA1, BRCA2, BRIP1, CDH1, CDK4, CDKN2A, CHEK2, DICER1, HOXB13, EPCAM, GREM1, MLH1, MRE11A, MSH2, MSH3, MSH6, MUTYH, NBN, NF1, PALB2, PMS2, POLD1, POLE, PTEN, RAD50, RAD51C, RAD51D, RECQL, SMAD4, SMARCA4, STK11, and TP53. TumorNext-HRD is a paired tumor and germline analysis of BRCA1 and BRCA2 plus 9 additional genes in the homologous recombination repair pathway (ATM, BARD1, BRCA1, BRCA2, BRIP1, CHEK2, MRE11A, NBN, PALB2, RAD51C, RAD51D).     Interval History: The patient reports doing well.  She has intermittent weeks of constipation that resolved.  Denies any bladder symptoms.  Denies any vaginal bleeding or discharge.  Reports good appetite without nausea or emesis.  Is taking daily calcium and vitamin D supplementation.  Past Medical/Surgical History: Past Medical History:  Diagnosis Date   Anemia    Bronchitis    Cancer ([place])    Complication of anesthesia    Endometriosis    Family history of renal cancer 05/09/2021   History of anemia    Migraines    Osteoporosis    PONV (postoperative nausea and vomiting)     Past Surgical History:  Procedure Laterality Date   [address]   and BTSP   COLONOSCOPY     DEBULKING N/A 07/02/2021   Procedure: TUMOR DEBULKING;  Surgeon: Lafonda Mosses, MD;  Location: [facility];  Service: Gynecology;  Laterality: N/A;   HYSTERECTOMY ABDOMINAL WITH SALPINGO-OOPHORECTOMY N/A 07/02/2021   Procedure: HYSTERECTOMY ABDOMINAL WITH BILATERAL SALPINGO-OOPHORECTOMY; OMENTECTOMY;  Surgeon: Lafonda Mosses, MD;  Location: [facility];  Service: Gynecology;  Laterality: N/A;   LAPAROSCOPY     for  infertility-endometriosis   LAPAROSCOPY     2nd for infertility   mole removed     precancerous mole right upper abd   ORIF WRIST FRACTURE Right 08/02/2020   Procedure: Right distal radius open reduction internal fixation;  Surgeon: Verner Mould, MD;  Location: [facility];  Service: Orthopedics;  Laterality: Right;  8mn   TONSILLECTOMY AND ADENOIDECTOMY      Family History  Problem Relation Age of Onset   Renal cancer Father 59      Agent Orange exposure   Hypertension Sister    Hypothyroidism Sister    Other Sister        Wegener's syndrome   Hypertension Brother    Diabetes Maternal Grandmother    Heart attack Paternal Grandfather    Osteoporosis Other     Social History   Socioeconomic History   Marital status: Married    Spouse name: Phil   Number of children: 2   Years of education: Not on file   Highest education level: Not on file  Occupational History   Occupation: retired  Tobacco Use   Smoking status:  Former    Types: Cigarettes    Quit date: 12/02/1983    Years since quitting: 38.1   Smokeless tobacco: Never   Tobacco comments:    quit 1985  Vaping Use   Vaping Use: Never used  Substance and Sexual Activity   Alcohol use: Not Currently    Comment: ocassional   Drug use: No   Sexual activity: Yes    Partners: Male    Birth control/protection: Surgical, Post-menopausal    Comment: BTL  Other Topics Concern   Not on file  Social History Narrative   Not on file   Social Determinants of Health   Financial Resource Strain: Not on file  Food Insecurity: Not on file  Transportation Needs: No Transportation Needs   Lack of Transportation (Medical): No   Lack of Transportation (Non-Medical): No  Physical Activity: Sufficiently Active   Days of Exercise per Week: 4 days   Minutes of Exercise per Session: 60 min  Stress: No Stress Concern Present   Feeling of Stress : Not at all  Social Connections: Socially Integrated   Frequency of  Communication with Friends and Family: More than three times a week   Frequency of Social Gatherings with Friends and Family: More than three times a week   Attends Religious Services: More than 4 times per year   Active Member of [employer] or Organizations: Yes   Attends Music therapist: More than 4 times per year   Marital Status: Married    Current Medications:  Current Outpatient Medications:    acetaminophen (TYLENOL) 500 MG tablet, Take 500 mg by mouth every 6 (six) hours as needed for moderate pain or headache., Disp: , Rfl:    Boswellia-Glucosamine-Vit D (OSTEO BI-FLEX ONE PER DAY PO), Take by mouth daily., Disp: , Rfl:    Calcium Carb-Cholecalciferol (CALCIUM 500/D PO), Take 1 tablet by mouth daily. Vita fusion calcium 500 mg with 25 mcg of vitamin D, Disp: , Rfl:    EPINEPHrine 0.3 mg/0.3 mL IJ SOAJ injection, Inject 0.3 mg as directed as needed (allergic reaction). , Disp: , Rfl:    ibuprofen (ADVIL) 600 MG tablet, Take 1 tablet (600 mg total) by mouth every 6 (six) hours as needed for moderate pain. For AFTER surgery, Disp: 30 tablet, Rfl: 0   letrozole (FEMARA) 2.5 MG tablet, Take 1 tablet (2.5 mg total) by mouth daily., Disp: 60 tablet, Rfl: 6   Multiple Vitamins-Minerals (ADULT GUMMY PO), Take 2 capsules by mouth daily., Disp: , Rfl:    polyethylene glycol (MIRALAX / GLYCOLAX) 17 g packet, Take 17 g by mouth daily., Disp: , Rfl:    senna-docusate (SENOKOT-S) 8.6-50 MG tablet, Take 2 tablets by mouth at bedtime. For AFTER surgery, do not take if having diarrhea, Disp: 30 tablet, Rfl: 0   SUMAtriptan (IMITREX) 100 MG tablet, 1 tab with headache onset.  Can repeat in 2 hours.  Max dosage 220m/24 hours. (Patient taking differently: Take 100 mg by mouth every 2 (two) hours as needed for migraine. Can repeat in 2 hours.  Max dosage 2015m24 hours.), Disp: 27 tablet, Rfl: 4  Review of Systems: Denies appetite changes, fevers, chills, fatigue, unexplained weight  changes. Denies hearing loss, neck lumps or masses, mouth sores, ringing in ears or voice changes. Denies cough or wheezing.  Denies shortness of breath. Denies chest pain or palpitations. Denies leg swelling. Denies abdominal distention, pain, blood in stools, constipation, diarrhea, nausea, vomiting, or early satiety. Denies pain with intercourse, dysuria, frequency,  hematuria or incontinence. Denies hot flashes, pelvic pain, vaginal bleeding or vaginal discharge.   Denies joint pain, back pain or muscle pain/cramps. Denies itching, rash, or wounds. Denies dizziness, headaches, numbness or seizures. Denies swollen lymph nodes or glands, denies easy bruising or bleeding. Denies anxiety, depression, confusion, or decreased concentration.  Physical Exam: BP (!) 134/91 (BP Location: Left Arm, Patient Position: Sitting)    Pulse 82    Temp 97.6 F (36.4 C) (Oral)    Resp 16    Ht 5' 4.96" (1.65 m)    Wt 125 lb 3.2 oz (56.8 kg)    LMP 10/31/2008    SpO2 96%    BMI 20.86 kg/m  General: Alert, oriented, no acute distress. HEENT: Normocephalic, atraumatic, sclera anicteric. Chest: Clear to auscultation bilaterally.  No wheezes or rhonchi. Cardiovascular: Regular rate and rhythm, no murmurs. Abdomen: soft, nontender.  Normoactive bowel sounds.  No masses or hepatosplenomegaly appreciated.  Well-healed incision. Extremities: Grossly normal range of motion.  Warm, well perfused.  No edema bilaterally. Skin: No rashes or lesions noted. Lymphatics: No cervical, supraclavicular, or inguinal adenopathy. GU: Normal appearing external genitalia without erythema, excoriation, or lesions.  Speculum exam reveals mildly atrophic vaginal mucosa, no cuff intact, no lesions or masses.  Bimanual exam reveals cuff intact, no nodularity or masses.  Rectovaginal exam confirms these findings.  Laboratory & Radiologic Studies: None new  Assessment & Plan: Karen Winters is a 71 y.o. 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 Genetic testing shows no somatic or germline mutations.   Patient is overall doing very well and is NED on exam.  CA-125 drawn today.   She is on letrozole for maintenance hormonal therapy.  She is tolerating this well without side effects.  She is continued on calcium and vitamin D supplementation.  After meeting with endocrinology, she elected not to pursue additional treatment.  She continues to be very active.  Based on our discussion today, we will plan to repeat bone scan towards the end of the year, 2 years after her last.  Depending on results of this, may elect to change letrozole to a different hormonal agent or stop the medication altogether.  The patient will return for her next surveillance visit in 3 months.  We reviewed signs and symptoms that should prompt a phone call to be seen sooner than that visit.  32 minutes of total time was spent for this patient encounter, including preparation, face-to-face counseling with the patient and coordination of care, and documentation of the encounter.  Jeral Pinch, MD  Division of Gynecologic Oncology  Department of Obstetrics and Gynecology  [facility]

## 2022-01-27 NOTE — Patient Instructions (Signed)
It was good to see you today.  I do not see or feel any evidence of cancer recurrence on your exam.  Your CA-125 will result tomorrow.  I will plan to see you for follow-up visit in 3 months.  Please reach out if you have any concerning or new symptoms before then.

## 2022-01-28 LAB — CA 125: Cancer Antigen (CA) 125: 9.9 U/mL (ref 0.0–38.1)

## 2022-03-24 ENCOUNTER — Ambulatory Visit (INDEPENDENT_AMBULATORY_CARE_PROVIDER_SITE_OTHER): Payer: Medicare HMO | Admitting: Nurse Practitioner

## 2022-03-24 ENCOUNTER — Encounter (HOSPITAL_BASED_OUTPATIENT_CLINIC_OR_DEPARTMENT_OTHER): Payer: Self-pay | Admitting: Nurse Practitioner

## 2022-03-24 VITALS — BP 117/72 | HR 95 | Ht 65.0 in | Wt 129.7 lb

## 2022-03-24 DIAGNOSIS — D72825 Bandemia: Secondary | ICD-10-CM

## 2022-03-24 DIAGNOSIS — Z1329 Encounter for screening for other suspected endocrine disorder: Secondary | ICD-10-CM | POA: Diagnosis not present

## 2022-03-24 DIAGNOSIS — Z1321 Encounter for screening for nutritional disorder: Secondary | ICD-10-CM

## 2022-03-24 DIAGNOSIS — Z139 Encounter for screening, unspecified: Secondary | ICD-10-CM | POA: Diagnosis not present

## 2022-03-24 DIAGNOSIS — Z0000 Encounter for general adult medical examination without abnormal findings: Secondary | ICD-10-CM

## 2022-03-24 DIAGNOSIS — Z13228 Encounter for screening for other metabolic disorders: Secondary | ICD-10-CM

## 2022-03-24 DIAGNOSIS — Z130 Encounter for screening for diseases of the blood and blood-forming organs and certain disorders involving the immune mechanism: Secondary | ICD-10-CM

## 2022-03-24 DIAGNOSIS — C563 Malignant neoplasm of bilateral ovaries: Secondary | ICD-10-CM

## 2022-03-24 NOTE — Progress Notes (Signed)
? ?Complete physical exam ? ?Patient: Karen Winters   DOB: Jun 17, 1951   71 y.o. Female  MRN: [id number] ? ?Subjective:  ?  ?Chief Complaint  ?Patient presents with  ? Annual Exam  ? ? ?Karen Winters is a 71 y.o. female who presents today for a complete physical exam. She reports consuming a general and high protein  diet. Home exercise routine includes running 3 days a week 8-10 miles, spin class once a week for 45 minutes, rowing 2-4 times a week for 15 minutes, weight lifting/strength training, routine outdoor hikinig, and minimum of 10,000 steps daily.  ?She generally feels well. She reports sleeping well. She does not have additional problems to discuss today.  ? ?She is currently retired and enjoying an active lifestyle with her husband. ?She denies alcoholic beverage consumption, nicotine consumption, and illegal/recreational drug use. ? ?She is currently sexually active with 1 partner and denies any concerns with STIs today. ?Most recent fall risk assessment: ? ?  03/24/2022  ? 10:17 AM  ?Fall Risk   ?Falls in the past year? 0  ?Number falls in past yr: 0  ?Injury with Fall? 0  ?Risk for fall due to : No Fall Risks  ?Follow up Falls evaluation completed;Education provided  ? ?  ?Most recent depression screenings: ? ?  03/24/2022  ? 10:17 AM 08/12/2021  ? 11:22 AM  ?PHQ 2/9 Scores  ?PHQ - 2 Score 0 0  ?PHQ- 9 Score  0  ?Exception Documentation Medical reason   ? ? ?Vision:Within last year and Dental: No current dental problems and Receives regular dental care ? ?Patient Active Problem List  ? Diagnosis Date Noted  ? Osteoporosis 01/06/2022  ? Screening for osteoporosis 08/12/2021  ? Actinic keratosis of scalp 08/12/2021  ? Encounter for annual physical exam 08/12/2021  ? Genetic testing 08/09/2021  ? Leukocytosis 05/14/2021  ? Family history of renal cancer 05/09/2021  ? Change in bowel habits 04/11/2021  ? Anorexia 04/11/2021  ? Ovarian cancer ([place]) 04/10/2021  ? Adnexal mass 04/09/2021  ? Malignant ascites  04/09/2021  ? History of tobacco use 04/04/2013  ? Migraine without status migrainosus, not intractable 03/29/2012  ? 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 ?Past Medical History:  ?Diagnosis Date  ? Anemia   ? Bronchitis   ? Cancer [facility])   ? Complication of anesthesia   ? Endometriosis   ? Family history of renal cancer 05/09/2021  ? History of anemia   ? Migraines   ? Osteoporosis   ? PONV (postoperative nausea and vomiting)   ? ?Allergies  ?Allergen Reactions  ? Bee Venom Swelling  ? Latex Other (See Comments)  ?  Redness, itching  ? Mushroom Extract Complex Nausea And Vomiting  ?  Diarrhea  ? Penicillins Hives  ?  Reaction:  71 years old  ? Tape Itching  ?  Redness  ? ? ?Patient Care Team: ?Shariq Puig, Coralee Pesa, NP as PCP - General (Nurse Practitioner) ?Awanda Mink Craige Cotta, RN as Oncology Nurse Navigator (Oncology)  ? ?Outpatient Medications Prior to Visit  ?Medication Sig  ? Calcium Carbonate-Vit D-Min (CALTRATE 600+D PLUS MINERALS PO)   ? acetaminophen (TYLENOL) 500 MG tablet Take 500 mg by mouth every 6 (six) hours as needed for moderate pain or headache.  ? [place]-Glucosamine-Vit D (OSTEO BI-FLEX ONE PER DAY PO) Take by mouth daily.  ? EPINEPHrine 0.3 mg/0.3 mL IJ SOAJ injection Inject 0.3 mg as directed as needed (allergic reaction).   ?  ibuprofen (ADVIL) 600 MG tablet Take 1 tablet (600 mg total) by mouth every 6 (six) hours as needed for moderate pain. For AFTER surgery  ? letrozole (FEMARA) 2.5 MG tablet Take 1 tablet (2.5 mg total) by mouth daily.  ? Multiple Vitamins-Minerals (ADULT GUMMY PO) Take 2 capsules by mouth daily.  ? polyethylene glycol (MIRALAX / GLYCOLAX) 17 g packet Take 17 g by mouth daily.  ? senna-docusate (SENOKOT-S) 8.6-50 MG tablet Take 2 tablets by mouth at bedtime. For AFTER surgery, do not take if having diarrhea  ? SUMAtriptan (IMITREX) 100 MG tablet 1 tab with headache onset.  Can repeat in 2 hours.  Max dosage '200mg'$ /24 hours. (Patient taking differently: Take 100 mg by mouth  every 2 (two) hours as needed for migraine. Can repeat in 2 hours.  Max dosage '200mg'$ /24 hours.)  ? ?No facility-administered medications prior to visit.  ? ? ?Review of Systems  ?Constitutional: Negative.   ?HENT: Negative.    ?Eyes: Negative.   ?Respiratory: Negative.    ?Gastrointestinal: Negative.   ?Genitourinary: Negative.   ?Musculoskeletal: Negative.   ?Neurological: Negative.  Negative for facial asymmetry.  ?Psychiatric/Behavioral: Negative.    ?Review of Systems  ?Constitutional: Negative.   ?HENT: Negative.    ?Eyes: Negative.   ?Respiratory: Negative.    ?Gastrointestinal: Negative.   ?Endocrine: Negative.   ?Genitourinary: Negative.   ?Musculoskeletal: Negative.   ?Allergic/Immunologic: Negative.   ?Neurological: Negative.  Negative for facial asymmetry.  ?Hematological: Negative.   ?Psychiatric/Behavioral: Negative.    ? ?   ?Objective:  ? ?  ?BP 117/72   Pulse 95   Ht '5\' 5"'$  (1.651 m)   Wt 129 lb 11.2 oz (58.8 kg)   LMP 10/31/2008   SpO2 99%   BMI 21.58 kg/m?  ?BP Readings from Last 3 Encounters:  ?03/24/22 117/72  ?01/27/22 (!) 134/91  ?01/06/22 110/84  ? ?Wt Readings from Last 3 Encounters:  ?03/24/22 129 lb 11.2 oz (58.8 kg)  ?01/27/22 125 lb 3.2 oz (56.8 kg)  ?01/06/22 127 lb 12.8 oz (58 kg)  ? ? ?Physical Exam ?Vitals and nursing note reviewed.  ?Constitutional:   ?   General: She is not in acute distress. ?   Appearance: Normal appearance. She is normal weight. She is not ill-appearing.  ?HENT:  ?   Head: Normocephalic and atraumatic.  ?   Right Ear: Hearing, tympanic membrane, ear canal and external ear normal.  ?   Left Ear: Hearing, tympanic membrane, ear canal and external ear normal.  ?   Nose: Nose normal. No congestion or rhinorrhea.  ?   Right Sinus: No maxillary sinus tenderness or frontal sinus tenderness.  ?   Left Sinus: No maxillary sinus tenderness or frontal sinus tenderness.  ?   Mouth/Throat:  ?   Lips: Pink.  ?   Mouth: Mucous membranes are moist.  ?   Pharynx: Oropharynx  is clear. No posterior oropharyngeal erythema.  ?Eyes:  ?   General: Lids are normal. Vision grossly intact.  ?   Extraocular Movements: Extraocular movements intact.  ?   Conjunctiva/sclera: Conjunctivae normal.  ?   Pupils: Pupils are equal, round, and reactive to light.  ?   Funduscopic exam: ?   Right eye: Red reflex present.     ?   Left eye: Red reflex present. ?   Visual Fields: Right eye visual fields normal and left eye visual fields normal.  ?Neck:  ?   Thyroid: No thyromegaly.  ?  Vascular: No carotid bruit.  ?Cardiovascular:  ?   Rate and Rhythm: Normal rate and regular rhythm.  ?   Chest Wall: PMI is not displaced.  ?   Pulses: Normal pulses.     ?     Dorsalis pedis pulses are 2+ on the right side and 2+ on the left side.  ?     Posterior tibial pulses are 2+ on the right side and 2+ on the left side.  ?   Heart sounds: Normal heart sounds. No murmur heard. ?Pulmonary:  ?   Effort: Pulmonary effort is normal. No respiratory distress.  ?   Breath sounds: Normal breath sounds.  ?Abdominal:  ?   General: Abdomen is flat. Bowel sounds are normal. There is no distension.  ?   Palpations: Abdomen is soft. There is no hepatomegaly, splenomegaly or mass.  ?   Tenderness: There is no abdominal tenderness. There is no right CVA tenderness, left CVA tenderness, guarding or rebound.  ?Musculoskeletal:     ?   General: Normal range of motion.  ?   Cervical back: Full passive range of motion without pain, normal range of motion and neck supple. No tenderness.  ?   Right lower leg: No edema.  ?   Left lower leg: No edema.  ?Feet:  ?   Left foot:  ?   Toenail Condition: Left toenails are normal.  ?Lymphadenopathy:  ?   Cervical: No cervical adenopathy.  ?   Upper Body:  ?   Right upper body: No supraclavicular adenopathy.  ?   Left upper body: No supraclavicular adenopathy.  ?Skin: ?   General: Skin is warm and dry.  ?   Capillary Refill: Capillary refill takes less than 2 seconds.  ?   Nails: There is no clubbing.   ?Neurological:  ?   General: No focal deficit present.  ?   Mental Status: She is alert and oriented to person, place, and time.  ?   GCS: GCS eye subscore is 4. GCS verbal subscore is 5. GCS motor subscore

## 2022-03-24 NOTE — Patient Instructions (Signed)
It was a pleasure seeing you today. I hope your time spent with [place] was pleasant and helpful. Please let us know if there is anything we can do to improve the service you receive.  ? ?Today we discussed concerns with: ? 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 ?We will see what your labs show and I will let you know if there is anything concerning ?If you have any needs, do not hesitate to let me know ? ?The following orders have been placed for you today: ? ?Orders Placed This Encounter  ?Procedures  ? CBC with Differential/Platelet  ?  Order Specific Question:   Release to patient  ?  Answer:   Immediate  ? Comprehensive metabolic panel  ?  Order Specific Question:   Has the patient fasted?  ?  Answer:   Yes  ?  Order Specific Question:   Release to patient  ?  Answer:   Immediate  ? TSH  ?  Order Specific Question:   Release to patient  ?  Answer:   Immediate  ? T4, free  ? Lipid panel  ?  Order Specific Question:   Has the patient fasted?  ?  Answer:   Yes  ?  Order Specific Question:   Release to patient  ?  Answer:   Immediate  ? ? ? ?Important Office Information ?Lab Results ?If labs were ordered, please note that you will see results through [place] as soon as they come available from [facility].  ?It takes up to 5 business days for the results to be routed to me and for me to review them once all of the lab results have come through from [facility]. I will make recommendations based on your results and send these through [place] or someone from the office will call you to discuss. If your labs are abnormal, we may contact you to schedule a visit to discuss the results and make recommendations.  ?If you have not heard from [place] within 5 business days or you have waited longer than a week and your lab results have not come through on [place], please feel free to call the  office or send a message through [place] to follow-up on these labs.  ? ?Referrals ?If referrals were placed today, the office where the referral was sent will contact you either by phone or through [place] to set up scheduling. Please note that it can take up to a week for the referral office to contact you. If you do not hear from them in a week, please contact the referral office directly to inquire about scheduling.  ? ?Condition Treated ?If your condition worsens or you begin to have new symptoms, please schedule a follow-up appointment for further evaluation. If you are not sure if an appointment is needed, you may call the office to leave a message for the nurse and someone will contact you with recommendations.  ?If you have an urgent or life threatening emergency, please do not call the office, but seek emergency evaluation by calling 911 or going to the nearest emergency room for evaluation.  ? ?MyChart and Phone Calls ?Please do not use MyChart for urgent messages. It may take up to 3 business days for MyChart messages to be read by staff and if they are unable to handle the request, an additional 3 business days for them to be routed to me  and for my response.  ?Messages sent to the provider through [facility] do not come directly to the provider, please allow time for these messages to be routed and for me to respond.  ?We get a large volume of MyChart messages daily and these are responded to in the order received.  ? ?For urgent messages, please call the office at [phone] and speak with the front office staff or leave a message on the line of my assistant for guidance.  ?We are seeing patients from the hours of 8:00 am through 5:00 pm and calls directly to the nurse may not be answered immediately due to seeing patients, but your call will be returned as soon as possible.  ?Phone  messages received after 4:00 PM Monday through Thursday may not be returned until the following business day. Phone  messages received after 11:00 AM on Friday may not be returned until Monday.  ? ?After Hours ?We share on call hours with providers from other offices. If you have an urgent need after hours that cannot wait until the next business day, please contact the on call provider by calling the office number. A nurse will speak with you and contact the provider if needed for recommendations.  ?If you have an urgent or life threatening emergency after hours, please do not call the on call provider, but seek emergency evaluation by calling 911 or going to the nearest emergency room for evaluation.  ? ?Paperwork ?All paperwork requires a minimum of 5 days to complete and return to you or the designated personnel. Please keep this in mind when bringing in forms or sending requests for paperwork completion to the office.  ?  ?

## 2022-03-24 NOTE — Assessment & Plan Note (Signed)
CPE today with no abnormal findings.  Discussed zoster and pneumonia vaccines with patient.  We will monitor labs today for white counts.  Given that she is so close to recent treatment for cancer we will hold off on vaccinations at this time.  She plans to have repeat DEXA scan in the fall of this year.  We will monitor for results. ?

## 2022-03-24 NOTE — Assessment & Plan Note (Signed)
No signs of recurrence present at this time. Abdomen is soft, non-tender with no changes in BM or bleeding present. Continuing on letrozole at this time. No alarm symptoms present.  We will continue to collaborate care with oncology and monitor for new symptoms. ?

## 2022-03-24 NOTE — Assessment & Plan Note (Signed)
History of leukocytosis with previous treatment for ovarian cancer.  We will recheck labs today for observation. ?We will make changes to plan of care based on findings.  Continue to collaborate care with oncology. ?

## 2022-03-25 LAB — COMPREHENSIVE METABOLIC PANEL
ALT: 20 IU/L (ref 0–32)
AST: 26 IU/L (ref 0–40)
Albumin/Globulin Ratio: 2.1 (ref 1.2–2.2)
Albumin: 4.7 g/dL (ref 3.8–4.8)
Alkaline Phosphatase: 115 IU/L (ref 44–121)
BUN/Creatinine Ratio: 29 — ABNORMAL HIGH (ref 12–28)
BUN: 22 mg/dL (ref 8–27)
Bilirubin Total: 0.3 mg/dL (ref 0.0–1.2)
CO2: 24 mmol/L (ref 20–29)
Calcium: 10 mg/dL (ref 8.7–10.3)
Chloride: 103 mmol/L (ref 96–106)
Creatinine, Ser: 0.75 mg/dL (ref 0.57–1.00)
Globulin, Total: 2.2 g/dL (ref 1.5–4.5)
Glucose: 87 mg/dL (ref 70–99)
Potassium: 4.1 mmol/L (ref 3.5–5.2)
Sodium: 141 mmol/L (ref 134–144)
Total Protein: 6.9 g/dL (ref 6.0–8.5)
eGFR: 87 mL/min/{1.73_m2} (ref >59)

## 2022-03-25 LAB — CBC WITH DIFFERENTIAL/PLATELET
Basophils Absolute: 0 10*3/uL (ref 0.0–0.2)
Basos: 0 %
EOS (ABSOLUTE): 0 10*3/uL (ref 0.0–0.4)
Eos: 0 %
Hematocrit: 45.5 % (ref 34.0–46.6)
Hemoglobin: 15.4 g/dL (ref 11.1–15.9)
Immature Grans (Abs): 0 10*3/uL (ref 0.0–0.1)
Immature Granulocytes: 0 %
Lymphocytes Absolute: 2.5 10*3/uL (ref 0.7–3.1)
Lymphs: 25 %
MCH: 30.9 pg (ref 26.6–33.0)
MCHC: 33.8 g/dL (ref 31.5–35.7)
MCV: 91 fL (ref 79–97)
Monocytes Absolute: 0.5 10*3/uL (ref 0.1–0.9)
Monocytes: 5 %
Neutrophils Absolute: 7 10*3/uL (ref 1.4–7.0)
Neutrophils: 70 %
Platelets: 243 10*3/uL (ref 150–450)
RBC: 4.98 x10E6/uL (ref 3.77–5.28)
RDW: 12.3 % (ref 11.7–15.4)
WBC: 10 10*3/uL (ref 3.4–10.8)

## 2022-03-25 LAB — LIPID PANEL
Chol/HDL Ratio: 3.3 ratio (ref 0.0–4.4)
Cholesterol, Total: 224 mg/dL — ABNORMAL HIGH (ref 100–199)
HDL: 67 mg/dL (ref >39)
LDL Chol Calc (NIH): 141 mg/dL — ABNORMAL HIGH (ref 0–99)
Triglycerides: 89 mg/dL (ref 0–149)
VLDL Cholesterol Cal: 16 mg/dL (ref 5–40)

## 2022-03-25 LAB — T4, FREE: Free T4: 1.23 ng/dL (ref 0.82–1.77)

## 2022-03-25 LAB — TSH: TSH: 0.816 u[IU]/mL (ref 0.450–4.500)

## 2022-04-18 ENCOUNTER — Encounter: Payer: Self-pay | Admitting: Gynecologic Oncology

## 2022-04-25 ENCOUNTER — Inpatient Hospital Stay: Payer: Medicare HMO

## 2022-04-25 ENCOUNTER — Encounter: Payer: Self-pay | Admitting: Gynecologic Oncology

## 2022-04-25 ENCOUNTER — Inpatient Hospital Stay: Payer: Medicare HMO | Attending: Gynecologic Oncology | Admitting: Gynecologic Oncology

## 2022-04-25 ENCOUNTER — Other Ambulatory Visit: Payer: Self-pay

## 2022-04-25 VITALS — BP 129/83 | HR 65 | Temp 97.8°F | Resp 16 | Ht 64.5 in | Wt 130.2 lb

## 2022-04-25 DIAGNOSIS — C563 Malignant neoplasm of bilateral ovaries: Secondary | ICD-10-CM | POA: Insufficient documentation

## 2022-04-25 DIAGNOSIS — M810 Age-related osteoporosis without current pathological fracture: Secondary | ICD-10-CM | POA: Insufficient documentation

## 2022-04-25 DIAGNOSIS — Z79811 Long term (current) use of aromatase inhibitors: Secondary | ICD-10-CM | POA: Diagnosis not present

## 2022-04-25 DIAGNOSIS — Z90722 Acquired absence of ovaries, bilateral: Secondary | ICD-10-CM | POA: Insufficient documentation

## 2022-04-25 DIAGNOSIS — Z90710 Acquired absence of both cervix and uterus: Secondary | ICD-10-CM | POA: Insufficient documentation

## 2022-04-25 DIAGNOSIS — D3910 Neoplasm of uncertain behavior of unspecified ovary: Secondary | ICD-10-CM

## 2022-04-25 DIAGNOSIS — M818 Other osteoporosis without current pathological fracture: Secondary | ICD-10-CM

## 2022-04-25 DIAGNOSIS — C569 Malignant neoplasm of unspecified ovary: Secondary | ICD-10-CM

## 2022-04-25 NOTE — Patient Instructions (Signed)
It was good to see you today.  I do not see or feel any evidence of cancer on your exam.  I will see you back in 3 months.  As always, please call if anything changes.  Your blood test should be back over the weekend and will result to you in MyChart.  I will call you if there is any change in the tumor marker level.

## 2022-04-25 NOTE — Progress Notes (Signed)
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  She was scheduled to see a surgeon about possible repair.  She also endorses intermittent pain in around the area of her right hip that she thought was secondary to her exercise routine.  Over the last week, she has had a decreased appetite.  She denies any nausea or emesis but endorses several months of abdominal bloating and early satiety.  She notes normal bowel function, which she describes as bowel movements at least daily.  She has had some increased urinary frequency and around [place] developed stress urinary incontinence when she runs.     04/04/2021 Imaging   US pelvis  Septate uterus, otherwise unremarkable.   Normal endometrial complex.   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   Recommend further assessment by CT imaging with IV and oral contrast.   04/05/2021 Tumor Marker   Patient's tumor was tested for the following markers: CA-125 Results of the tumor marker test revealed 826.   04/05/2021 Imaging   1. Large bilateral solid-appearing adnexal masses are identified concerning for ovarian neoplasm. There is soft tissue infiltration into the surrounding peritoneal cavity which partially encases nonobstructed loops of small bowel with probable serosal involvement. 2. Moderate volume of ascites identified within the abdomen and pelvis likely secondary to peritoneal carcinomatosis. Diagnostic paracentesis may be helpful for further workup. 3. Mild increased caliber of small bowel loops with a few air-fluid levels. However, there are no signs to suggest a bowel  obstruction as enteric contrast material is noted up to the level of the hepatic flexure.   04/10/2021 Initial Diagnosis   Ovarian cancer ([place])   04/10/2021 Procedure   Successful ultrasound-guided paracentesis yielding 1.4 liters of peritoneal fluid.   04/11/2021 Cancer Staging   Staging form: Ovary, Fallopian Tube, and Primary Peritoneal Carcinoma, AJCC 8th Edition - Clinical stage from 04/11/2021: Stage IIIC (cT3c, cN0, cM0) - Signed by Heath Lark, MD on 04/11/2021 Stage prefix: Initial diagnosis    04/18/2021 Tumor Marker   Patient's tumor was tested for the following markers: CA-125. Results of the tumor marker test revealed 771   04/22/2021 - 06/05/2021 Chemotherapy          05/14/2021 Tumor Marker   Patient's tumor was tested for the following markers: CA-125. Results of the tumor marker test revealed 877.   06/10/2021 Imaging   1. Today's study demonstrates a centrally stable disease when compared to prior examination from 04/05/2021, with large bilateral ovarian lesions and moderate volume of malignant ascites, as detailed above. 2. Trace right pleural effusion lying dependently.     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  Ascites: approximately 2L upon entry and produced during surgery Operative Findings: Small mobile uterus, nodular masses filling the cul de sac. On intra-abdominal  entry, approximately 1.6L of green-tinged ascites was encountered. Normal liver edge, diaphragm, and stomach. Infracolic omentum with a [id number] nodular area and several smaller (1-3cm) deposits c/w tumor. Otherwise, omentum without evidence of disease. Small fibrinous debris on the small bowel, no obvious tumor implants. Mesentery free of disease. No appreciable adenopathy. Bilateral ovaries replaced with vesicular tumors (L>R), measuring approximately 8cm and 12cm. Several areas of plaque (appeared more consistent with treated tumor than active cancer - right pelvic sidewall, posterior cul de sac, and anterior cul de sac) noted, most excised. Uterus 4-6cm in size. Dense adhesions of the bladder to the LUS and cervix, likely combination of prior c-section and treatment effect. R0 resection at the end of surgery if peritoneal plaques not c/w tumor. No clear umbilical hernia.    07/02/2021 Pathology Results   A. 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   - Surface involvement present.  - Fallopian tube involvement present, non-invasive.   B. OVARY AND FALLOPIAN TUBE, LEFT, SALPINGO OOPHORECTOMY:  - Serous borderline tumor.  - Surface involvement present, non-invasive.  - Fallopian tube involvement present, non-invasive.   C. UTERUS AND CERVIX, HYSTERECTOMY:  - Uterus:       Endometrium: Benign endometrial type polyp. Inactive endometrium.  No hyperplasia or malignancy.       Myometrium: Unremarkable. No malignancy.       Serosa: Invasive serous implant.  - Cervix: Benign squamous and endocervical mucosa. No dysplasia or  malignancy.  - Bilateral fallopian tubes: Surface involvement present.   D. OMENTUM:  - Non-invasive serous implant.  - One of one lymph nodes negative for carcinoma (0/1).   ONCOLOGY TABLE:   OVARY or FALLOPIAN TUBE or PRIMARY PERITONEUM: Resection   Procedure: Hysterectomy with  bilateral salpingo-oophorectomy and omental  resection.  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  Other Tissue/ Organ Involvement: Not applicable  Largest Extrapelvic Peritoneal Focus: 2 mm  Peritoneal/Ascitic Fluid Involvement: Not applicable  Chemotherapy Response Score (CRS): Cannot be determined  Regional Lymph Nodes:       Number of Nodes with Metastasis Greater than 10 mm: 0       Number of Nodes with Metastasis 10 mm or Less (excludes isolated  tumor cells): 0       Number of Nodes with Isolated Tumor Cells (0.2 mm or less): 0       Number of Lymph Nodes Examined: 1  Distant Metastasis:       Distant Site(s) Involved: Not applicable  Pathologic Stage Classification (pTNM, AJCC 8th Edition): ypT3a, ypN0  Ancillary Studies: Can be performed upon request  Representative Tumor Block: A7  Comment(s): Given extensive involvement and neoadjuvant therapy it is  difficult to determine the exact primary location, but the right ovary  is slightly favored. Dr. Saralyn Pilar has reviewed the case.    07/02/2021 Pathology Results   FINAL MICROSCOPIC DIAGNOSIS:   A. OVARY AND FALLOPIAN TUBE, RIGHT, SALPINGO OOPHORECTOMY:  - Focal low grade  serous carcinoma arising in a serous borderline tumor.   - Surface involvement present.  - Fallopian tube involvement present, non-invasive.   B. OVARY AND FALLOPIAN TUBE, LEFT, SALPINGO OOPHORECTOMY:  - Serous borderline tumor.  - Surface involvement present, non-invasive.  - Fallopian tube involvement present, non-invasive.   C. UTERUS AND  CERVIX, HYSTERECTOMY:  - Uterus:       Endometrium: Benign endometrial type polyp. Inactive endometrium.  No hyperplasia or malignancy.       Myometrium: Unremarkable. No malignancy.       Serosa: Invasive serous implant.  - Cervix: Benign squamous and endocervical mucosa. No dysplasia or malignancy.  - Bilateral fallopian tubes: Surface involvement present.   D. OMENTUM:  - Non-invasive serous implant.  - One of one lymph nodes negative for carcinoma (0/1).   ONCOLOGY TABLE:   OVARY or FALLOPIAN TUBE or PRIMARY PERITONEUM: Resection   Procedure: Hysterectomy with bilateral salpingo-oophorectomy and omental resection.  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  Other Tissue/ Organ Involvement: Not applicable  Largest Extrapelvic Peritoneal Focus: 2 mm  Peritoneal/Ascitic Fluid Involvement: Not applicable  Chemotherapy Response Score (CRS): Cannot be determined  Regional Lymph Nodes:       Number of Nodes with Metastasis Greater than 10 mm: 0       Number of Nodes with Metastasis 10 mm or Less (excludes isolated  tumor cells): 0       Number of Nodes with Isolated Tumor Cells (0.2 mm or less): 0       Number of Lymph Nodes Examined: 1  Distant Metastasis:       Distant Site(s) Involved: Not applicable  Pathologic Stage Classification (pTNM, AJCC 8th Edition): ypT3a, ypN0  Ancillary Studies: Can be performed upon request  Representative Tumor Block: A7  Comment(s): Given extensive involvement and neoadjuvant therapy it is difficult to determine the exact primary location,  but the right ovary is slightly favored. Dr. Saralyn Pilar has reviewed the case.    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     Ascites: approximately 2L upon entry and produced during surgery Operative Findings: Small mobile uterus, nodular masses filling the cul de sac. On intra-abdominal entry, approximately 1.6L of green-tinged ascites was encountered. Normal liver edge, diaphragm, and stomach. Infracolic omentum with a 6B3AL nodular area and several smaller (1-3cm) deposits c/w tumor. Otherwise, omentum without evidence of disease. Small fibrinous debris on the small bowel, no obvious tumor implants. Mesentery free of disease. No appreciable adenopathy. Bilateral ovaries replaced with vesicular tumors (L>R), measuring approximately 8cm and 12cm. Several areas of plaque (appeared more consistent with treated tumor than active cancer - right pelvic sidewall,  posterior cul de sac, and anterior cul de sac) noted, most excised. Uterus 4-6cm in size. Dense adhesions of the bladder to the LUS and cervix, likely combination of prior c-section and treatment effect. R0 resection at the end of surgery if peritoneal plaques not c/w tumor. No clear umbilical hernia.    07/17/2021 Tumor Marker   Patient's tumor was tested for the following markers: CA-125. Results of the tumor marker test revealed 116.    08/06/2021 Genetic Testing   Negative genetic testing: no pathogenic variants detected in [facility] TumorNext-HRD + CancerNext Panel.  The report date is August 06, 2021.    The CancerNext gene panel offered by [employer] includes sequencing and rearrangement analysis for the following 37 genes:   APC, ATM, AXIN2 BARD1, BMPR1A, BRCA1, BRCA2, BRIP1, CDH1, CDK4, CDKN2A, CHEK2, DICER1, HOXB13, EPCAM, GREM1, MLH1, MRE11A, MSH2, MSH3, MSH6, MUTYH, NBN, NF1, PALB2, PMS2, POLD1, POLE, PTEN, RAD50, RAD51C, RAD51D, RECQL, SMAD4, SMARCA4, STK11, and TP53. TumorNext-HRD is a paired tumor and germline analysis of BRCA1 and BRCA2 plus 9 additional genes in the homologous recombination repair pathway (ATM, BARD1, BRCA1, BRCA2, BRIP1, CHEK2, MRE11A, NBN, PALB2, RAD51C, RAD51D).     Interval History: The patient reports doing very well.  She denies any vaginal bleeding or discharge.  Denies any abdominal or pelvic pain.  Was struggling with a little constipation, use Senokot, constipation resolved.  Denies any urinary symptoms.  No stress incontinence even when she is running.  Continues to take letrozole without any side effects.  Is taking vitamin D and calcium.  Past Medical/Surgical History: Past Medical History:  Diagnosis Date   Anemia    Bronchitis    Cancer ([place])    Complication of anesthesia    Endometriosis    Family history of renal cancer 05/09/2021   History of anemia    Migraines    Osteoporosis    PONV (postoperative nausea and vomiting)     Past Surgical History:  Procedure Laterality Date   Time   and BTSP   COLONOSCOPY     DEBULKING N/A 07/02/2021   Procedure: TUMOR DEBULKING;  Surgeon: Lafonda Mosses, MD;  Location: [facility];  Service: Gynecology;  Laterality: N/A;   HYSTERECTOMY ABDOMINAL WITH SALPINGO-OOPHORECTOMY N/A 07/02/2021   Procedure: HYSTERECTOMY ABDOMINAL WITH BILATERAL SALPINGO-OOPHORECTOMY; OMENTECTOMY;  Surgeon: Lafonda Mosses, MD;   Location: [facility];  Service: Gynecology;  Laterality: N/A;   LAPAROSCOPY     for infertility-endometriosis   LAPAROSCOPY     2nd for infertility   mole removed     precancerous mole right upper abd   ORIF WRIST FRACTURE Right 08/02/2020   Procedure: Right distal radius open reduction internal fixation;  Surgeon: Verner Mould, MD;  Location: [facility];  Service: Orthopedics;  Laterality: Right;  20min   TONSILLECTOMY AND ADENOIDECTOMY      Family History  Problem Relation Age of Onset   Renal cancer Father 74       Agent Orange exposure   Hypertension Sister    Hypothyroidism Sister    Other Sister        Wegener's syndrome   Hypertension Brother    Diabetes Maternal Grandmother    Heart attack Paternal Grandfather    Osteoporosis Other    Colon cancer Neg Hx    Breast cancer Neg Hx    Uterine cancer Neg Hx    Ovarian cancer Neg Hx    Endometrial cancer Neg Hx  Prostate cancer Neg Hx    Pancreatic cancer Neg Hx     Social History   Socioeconomic History   Marital status: Married    Spouse name: Phil   Number of children: 2   Years of education: Not on file   Highest education level: Not on file  Occupational History   Occupation: retired  Tobacco Use   Smoking status: Former    Types: Cigarettes    Quit date: 12/02/1983    Years since quitting: 38.4   Smokeless tobacco: Never   Tobacco comments:    quit 1985  Vaping Use   Vaping Use: Never used  Substance and Sexual Activity   Alcohol use: Not Currently    Comment: ocassional   Drug use: No   Sexual activity: Yes    Partners: Male    Birth control/protection: Surgical, Post-menopausal    Comment: BTL  Other Topics Concern   Not on file  Social History Narrative   Not on file   Social Determinants of Health   Financial Resource Strain: Not on file  Food Insecurity: Not on file  Transportation Needs: No Transportation Needs   Lack of Transportation (Medical): No   Lack of Transportation  (Non-Medical): No  Physical Activity: Sufficiently Active   Days of Exercise per Week: 4 days   Minutes of Exercise per Session: 60 min  Stress: No Stress Concern Present   Feeling of Stress : Not at all  Social Connections: Socially Integrated   Frequency of Communication with Friends and Family: More than three times a week   Frequency of Social Gatherings with Friends and Family: More than three times a week   Attends Religious Services: More than 4 times per year   Active Member of [employer] or Organizations: Yes   Attends Music therapist: More than 4 times per year   Marital Status: Married    Current Medications:  Current Outpatient Medications:    acetaminophen (TYLENOL) 500 MG tablet, Take 500 mg by mouth every 6 (six) hours as needed for moderate pain or headache., Disp: , Rfl:    Boswellia-Glucosamine-Vit D (OSTEO BI-FLEX ONE PER DAY PO), Take by mouth daily., Disp: , Rfl:    Calcium Carbonate-Vit D-Min (CALTRATE 600+D PLUS MINERALS PO), , Disp: , Rfl:    EPINEPHrine 0.3 mg/0.3 mL IJ SOAJ injection, Inject 0.3 mg as directed as needed (allergic reaction). , Disp: , Rfl:    ibuprofen (ADVIL) 600 MG tablet, Take 1 tablet (600 mg total) by mouth every 6 (six) hours as needed for moderate pain. For AFTER surgery, Disp: 30 tablet, Rfl: 0   letrozole (FEMARA) 2.5 MG tablet, Take 1 tablet (2.5 mg total) by mouth daily., Disp: 60 tablet, Rfl: 6   Multiple Vitamins-Minerals (ADULT GUMMY PO), Take 2 capsules by mouth daily., Disp: , Rfl:    polyethylene glycol (MIRALAX / GLYCOLAX) 17 g packet, Take 17 g by mouth daily., Disp: , Rfl:    senna-docusate (SENOKOT-S) 8.6-50 MG tablet, Take 2 tablets by mouth at bedtime. For AFTER surgery, do not take if having diarrhea, Disp: 30 tablet, Rfl: 0   SUMAtriptan (IMITREX) 100 MG tablet, 1 tab with headache onset.  Can repeat in 2 hours.  Max dosage $RemoveBe'200mg'hJBspEKcI$ /24 hours. (Patient taking differently: Take 100 mg by mouth every 2 (two) hours as  needed for migraine. Can repeat in 2 hours.  Max dosage $RemoveBe'200mg'tNuYLYJxp$ /24 hours.), Disp: 27 tablet, Rfl: 4  Review of Systems: Denies appetite changes, fevers, chills, fatigue,  unexplained weight changes. Denies hearing loss, neck lumps or masses, mouth sores, ringing in ears or voice changes. Denies cough or wheezing.  Denies shortness of breath. Denies chest pain or palpitations. Denies leg swelling. Denies abdominal distention, pain, blood in stools, constipation, diarrhea, nausea, vomiting, or early satiety. Denies pain with intercourse, dysuria, frequency, hematuria or incontinence. Denies hot flashes, pelvic pain, vaginal bleeding or vaginal discharge.   Denies joint pain, back pain or muscle pain/cramps. Denies itching, rash, or wounds. Denies dizziness, headaches, numbness or seizures. Denies swollen lymph nodes or glands, denies easy bruising or bleeding. Denies anxiety, depression, confusion, or decreased concentration.  Physical Exam: BP 129/83 (BP Location: Left Arm, Patient Position: Sitting)   Pulse 65   Temp 97.8 F (36.6 C) (Oral)   Resp 16   Ht 5' 4.5" (1.638 m)   Wt 130 lb 3.2 oz (59.1 kg)   LMP 10/31/2008   SpO2 100%   BMI 22.00 kg/m  General: Alert, oriented, no acute distress. HEENT: Normocephalic, atraumatic, sclera anicteric. Chest: Unlabored breathing on room air. Abdomen: soft, nontender.  Normoactive bowel sounds.  No masses or hepatosplenomegaly appreciated.  Well-healed scar. Extremities: Grossly normal range of motion.  Warm, well perfused.  No edema bilaterally. Skin: No rashes or lesions noted. Lymphatics: No cervical, supraclavicular, or inguinal adenopathy. GU: Normal appearing external genitalia without erythema, excoriation, or lesions.  Speculum exam reveals mildly atrophic vaginal mucosa, no cuff intact, no lesions or masses.  Bimanual exam reveals cuff intact, no nodularity or masses.  Rectovaginal exam confirms these findings.  Laboratory &  Radiologic Studies: Component Ref Range & Units 2 mo ago (01/27/22) 6 mo ago (10/14/21) 9 mo ago (07/17/21) 10 mo ago (06/04/21) 11 mo ago (05/14/21) 1 yr ago (04/18/21) 1 yr ago (04/05/21)  Cancer Antigen (CA) 125 0.0 - 38.1 U/mL 9.9  11.9 CM  116.0 High  CM  963.0 High  CM  877.0 High  CM  771.0 High  CM  826.0 High  CM     Assessment & Plan: Karen Winters is a 71 y.o. 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 Genetic testing shows no somatic or germline mutations.   Patient is overall doing very well and is NED on exam.  CA-125 will be today.   She is on letrozole for maintenance hormonal therapy.  She is tolerating this well without side effects.  She is continued on calcium and vitamin D supplementation.  After meeting with endocrinology, she elected not to pursue additional treatment.  She continues to be very active.  Based on our discussion today, we will plan to repeat bone scan towards the end of the year, 2 years after her last.  Depending on results of this, may elect to change letrozole to a different hormonal agent or stop the medication altogether.   The patient will return for her next surveillance visit in 3 months.  We reviewed signs and symptoms that should prompt a phone call to be seen sooner than that visit.  24 minutes of total time was spent for this patient encounter, including preparation, face-to-face counseling with the patient and coordination of care, and documentation of the encounter.  Jeral Pinch, MD  Division of Gynecologic Oncology  Department of Obstetrics and Gynecology  [facility]

## 2022-04-28 LAB — CA 125: Cancer Antigen (CA) 125: 10.7 U/mL (ref 0.0–38.1)

## 2022-07-16 ENCOUNTER — Encounter: Payer: Self-pay | Admitting: Gynecologic Oncology

## 2022-07-21 ENCOUNTER — Inpatient Hospital Stay (HOSPITAL_BASED_OUTPATIENT_CLINIC_OR_DEPARTMENT_OTHER): Payer: Medicare HMO | Admitting: Gynecologic Oncology

## 2022-07-21 ENCOUNTER — Other Ambulatory Visit: Payer: Self-pay

## 2022-07-21 ENCOUNTER — Encounter: Payer: Self-pay | Admitting: Gynecologic Oncology

## 2022-07-21 ENCOUNTER — Inpatient Hospital Stay: Payer: Medicare HMO | Attending: Gynecologic Oncology

## 2022-07-21 VITALS — BP 150/102 | HR 79 | Resp 17 | Wt 127.2 lb

## 2022-07-21 DIAGNOSIS — Z90722 Acquired absence of ovaries, bilateral: Secondary | ICD-10-CM | POA: Insufficient documentation

## 2022-07-21 DIAGNOSIS — M810 Age-related osteoporosis without current pathological fracture: Secondary | ICD-10-CM | POA: Insufficient documentation

## 2022-07-21 DIAGNOSIS — C569 Malignant neoplasm of unspecified ovary: Secondary | ICD-10-CM

## 2022-07-21 DIAGNOSIS — D3910 Neoplasm of uncertain behavior of unspecified ovary: Secondary | ICD-10-CM | POA: Diagnosis not present

## 2022-07-21 DIAGNOSIS — Z90710 Acquired absence of both cervix and uterus: Secondary | ICD-10-CM | POA: Diagnosis not present

## 2022-07-21 DIAGNOSIS — C563 Malignant neoplasm of bilateral ovaries: Secondary | ICD-10-CM

## 2022-07-21 DIAGNOSIS — M818 Other osteoporosis without current pathological fracture: Secondary | ICD-10-CM

## 2022-07-21 DIAGNOSIS — Z79811 Long term (current) use of aromatase inhibitors: Secondary | ICD-10-CM | POA: Diagnosis not present

## 2022-07-21 DIAGNOSIS — Z9221 Personal history of antineoplastic chemotherapy: Secondary | ICD-10-CM | POA: Insufficient documentation

## 2022-07-21 MED ORDER — LETROZOLE 2.5 MG PO TABS: 2.5000 mg | ORAL_TABLET | Freq: Every day | ORAL | 6 refills | Status: DC

## 2022-07-21 NOTE — Progress Notes (Signed)
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  She was scheduled to see a surgeon about possible repair.  She also endorses intermittent pain in around the area of her right hip that she thought was secondary to her exercise routine.  Over the last week, she has had a decreased appetite.  She denies any nausea or emesis but endorses several months of abdominal bloating and early satiety.  She notes normal bowel function, which she describes as bowel movements at least daily.  She has had some increased urinary frequency and around [place] developed stress urinary incontinence when she runs.     04/04/2021 Imaging   US pelvis  Septate uterus, otherwise unremarkable.   Normal endometrial complex.   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   Recommend further assessment by CT imaging with IV and oral contrast.   04/05/2021 Tumor Marker   Patient's tumor was tested for the following markers: CA-125 Results of the tumor marker test revealed 826.   04/05/2021 Imaging   1. Large bilateral solid-appearing adnexal masses are identified concerning for ovarian neoplasm. There is soft tissue infiltration into the surrounding peritoneal cavity which partially encases nonobstructed loops of small bowel with probable serosal involvement. 2. Moderate volume of ascites identified within the abdomen and pelvis likely secondary to peritoneal carcinomatosis. Diagnostic paracentesis may be helpful for further workup. 3. Mild increased caliber of small bowel loops with a few air-fluid levels. However, there are no signs to suggest a bowel  obstruction as enteric contrast material is noted up to the level of the hepatic flexure.   04/10/2021 Initial Diagnosis   Ovarian cancer ([place])   04/10/2021 Procedure   Successful ultrasound-guided paracentesis yielding 1.4 liters of peritoneal fluid.   04/11/2021 Cancer Staging   Staging form: Ovary, Fallopian Tube, and Primary Peritoneal Carcinoma, AJCC 8th Edition - Clinical stage from 04/11/2021: Stage IIIC (cT3c, cN0, cM0) - Signed by Heath Lark, MD on 04/11/2021 Stage prefix: Initial diagnosis   04/18/2021 Tumor Marker   Patient's tumor was tested for the following markers: CA-125. Results of the tumor marker test revealed 771   04/22/2021 - 06/05/2021 Chemotherapy         05/14/2021 Tumor Marker   Patient's tumor was tested for the following markers: CA-125. Results of the tumor marker test revealed 877.   06/10/2021 Imaging   1. Today's study demonstrates a centrally stable disease when compared to prior examination from 04/05/2021, with large bilateral ovarian lesions and moderate volume of malignant ascites, as detailed above. 2. Trace right pleural effusion lying dependently.     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     Ascites:  approximately 2L upon entry and produced during surgery Operative Findings: Small mobile uterus, nodular masses filling the cul de sac. On intra-abdominal  entry, approximately 1.6L of green-tinged ascites was encountered. Normal liver edge, diaphragm, and stomach. Infracolic omentum with a 5I4PP nodular area and several smaller (1-3cm) deposits c/w tumor. Otherwise, omentum without evidence of disease. Small fibrinous debris on the small bowel, no obvious tumor implants. Mesentery free of disease. No appreciable adenopathy. Bilateral ovaries replaced with vesicular tumors (L>R), measuring approximately 8cm and 12cm. Several areas of plaque (appeared more consistent with treated tumor than active cancer - right pelvic sidewall, posterior cul de sac, and anterior cul de sac) noted, most excised. Uterus 4-6cm in size. Dense adhesions of the bladder to the LUS and cervix, likely combination of prior c-section and treatment effect. R0 resection at the end of surgery if peritoneal plaques not c/w tumor. No clear umbilical hernia.    07/02/2021 Pathology Results   A. 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   - Surface involvement present.  - Fallopian tube involvement present, non-invasive.   B. OVARY AND FALLOPIAN TUBE, LEFT, SALPINGO OOPHORECTOMY:  - Serous borderline tumor.  - Surface involvement present, non-invasive.  - Fallopian tube involvement present, non-invasive.   C. UTERUS AND CERVIX, HYSTERECTOMY:  - Uterus:       Endometrium: Benign endometrial type polyp. Inactive endometrium.  No hyperplasia or malignancy.       Myometrium: Unremarkable. No malignancy.       Serosa: Invasive serous implant.  - Cervix: Benign squamous and endocervical mucosa. No dysplasia or  malignancy.  - Bilateral fallopian tubes: Surface involvement present.   D. OMENTUM:  - Non-invasive serous implant.  - One of one lymph nodes negative for carcinoma (0/1).   ONCOLOGY TABLE:   OVARY or FALLOPIAN TUBE or PRIMARY PERITONEUM: Resection   Procedure: Hysterectomy with  bilateral salpingo-oophorectomy and omental  resection.  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  Other Tissue/ Organ Involvement: Not applicable  Largest Extrapelvic Peritoneal Focus: 2 mm  Peritoneal/Ascitic Fluid Involvement: Not applicable  Chemotherapy Response Score (CRS): Cannot be determined  Regional Lymph Nodes:       Number of Nodes with Metastasis Greater than 10 mm: 0       Number of Nodes with Metastasis 10 mm or Less (excludes isolated  tumor cells): 0       Number of Nodes with Isolated Tumor Cells (0.2 mm or less): 0       Number of Lymph Nodes Examined: 1  Distant Metastasis:       Distant Site(s) Involved: Not applicable  Pathologic Stage Classification (pTNM, AJCC 8th Edition): ypT3a, ypN0  Ancillary Studies: Can be performed upon request  Representative Tumor Block: A7  Comment(s): Given extensive involvement and neoadjuvant therapy it is  difficult to determine the exact primary location, but the right ovary  is slightly favored. Dr. Saralyn Pilar has reviewed the case.    07/02/2021 Pathology Results   FINAL MICROSCOPIC DIAGNOSIS:   A. OVARY AND FALLOPIAN TUBE, RIGHT, SALPINGO OOPHORECTOMY:  - Focal low grade serous  carcinoma arising in a serous borderline tumor.   - Surface involvement present.  - Fallopian tube involvement present, non-invasive.   B. OVARY AND FALLOPIAN TUBE, LEFT, SALPINGO OOPHORECTOMY:  - Serous borderline tumor.  - Surface involvement present, non-invasive.  - Fallopian tube involvement present, non-invasive.   C. UTERUS AND  CERVIX, HYSTERECTOMY:  - Uterus:       Endometrium: Benign endometrial type polyp. Inactive endometrium.  No hyperplasia or malignancy.       Myometrium: Unremarkable. No malignancy.       Serosa: Invasive serous implant.  - Cervix: Benign squamous and endocervical mucosa. No dysplasia or malignancy.  - Bilateral fallopian tubes: Surface involvement present.   D. OMENTUM:  - Non-invasive serous implant.  - One of one lymph nodes negative for carcinoma (0/1).   ONCOLOGY TABLE:   OVARY or FALLOPIAN TUBE or PRIMARY PERITONEUM: Resection   Procedure: Hysterectomy with bilateral salpingo-oophorectomy and omental resection.  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  Other Tissue/ Organ Involvement: Not applicable  Largest Extrapelvic Peritoneal Focus: 2 mm  Peritoneal/Ascitic Fluid Involvement: Not applicable  Chemotherapy Response Score (CRS): Cannot be determined  Regional Lymph Nodes:       Number of Nodes with Metastasis Greater than 10 mm: 0       Number of Nodes with Metastasis 10 mm or Less (excludes isolated  tumor cells): 0       Number of Nodes with Isolated Tumor Cells (0.2 mm or less): 0       Number of Lymph Nodes Examined: 1  Distant Metastasis:       Distant Site(s) Involved: Not applicable  Pathologic Stage Classification (pTNM, AJCC 8th Edition): ypT3a, ypN0  Ancillary Studies: Can be performed upon request  Representative Tumor Block: A7  Comment(s): Given extensive involvement and neoadjuvant therapy it is difficult to determine the exact primary location,  but the right ovary is slightly favored. Dr. Saralyn Pilar has reviewed the case.    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     Ascites: approximately 2L upon entry and produced during surgery Operative Findings: Small mobile uterus, nodular masses filling the cul de sac. On intra-abdominal entry, approximately 1.6L of green-tinged ascites was encountered. Normal liver edge, diaphragm, and stomach. Infracolic omentum with a 8N4OE nodular area and several smaller (1-3cm) deposits c/w tumor. Otherwise, omentum without evidence of disease. Small fibrinous debris on the small bowel, no obvious tumor implants. Mesentery free of disease. No appreciable adenopathy. Bilateral ovaries replaced with vesicular tumors (L>R), measuring approximately 8cm and 12cm. Several areas of plaque (appeared more consistent with treated tumor than active cancer - right pelvic sidewall, posterior  cul de sac, and anterior cul de sac) noted, most excised. Uterus 4-6cm in size. Dense adhesions of the bladder to the LUS and cervix, likely combination of prior c-section and treatment effect. R0 resection at the end of surgery if peritoneal plaques not c/w tumor. No clear umbilical hernia.    07/17/2021 Tumor Marker   Patient's tumor was tested for the following markers: CA-125. Results of the tumor marker test revealed 116.    08/06/2021 Genetic Testing   Negative genetic testing: no pathogenic variants detected in [facility] TumorNext-HRD + CancerNext Panel.  The report date is August 06, 2021.    The CancerNext gene panel offered by [facility] [employer] includes sequencing and rearrangement analysis for the following 37 genes:   APC, ATM, AXIN2 BARD1, BMPR1A, BRCA1, BRCA2, BRIP1, CDH1, CDK4, CDKN2A, CHEK2, DICER1, HOXB13, EPCAM, GREM1, MLH1, MRE11A, MSH2, MSH3, MSH6, MUTYH, NBN, NF1, PALB2, PMS2, POLD1, POLE, PTEN, RAD50, RAD51C, RAD51D, RECQL, SMAD4, SMARCA4, STK11, and TP53. TumorNext-HRD is a paired tumor and germline analysis of BRCA1 and BRCA2 plus 9 additional genes in the homologous recombination repair pathway (ATM, BARD1, BRCA1, BRCA2, BRIP1, CHEK2, MRE11A, NBN, PALB2, RAD51C, RAD51D).     Interval History: Doing well.  Recently returned from a trip to [place].    Denies any abdominal or pelvic pain.  Denies any vaginal bleeding or discharge.  Reports baseline bowel and bladder function.  Past Medical/Surgical History: Past Medical History:  Diagnosis Date   Anemia    Bronchitis    Cancer ([place])    Complication of anesthesia    Endometriosis    Family history of renal cancer 05/09/2021   History of anemia    Migraines    Osteoporosis    PONV (postoperative nausea and vomiting)     Past Surgical History:  Procedure Laterality Date   [place]   and [facility]   COLONOSCOPY     DEBULKING N/A 07/02/2021   Procedure: TUMOR DEBULKING;  Surgeon: Lafonda Mosses, MD;  Location: [facility];  Service: Gynecology;  Laterality: N/A;   HYSTERECTOMY ABDOMINAL WITH SALPINGO-OOPHORECTOMY N/A 07/02/2021   Procedure: HYSTERECTOMY ABDOMINAL WITH BILATERAL SALPINGO-OOPHORECTOMY; OMENTECTOMY;  Surgeon: Lafonda Mosses, MD;  Location: [facility];  Service: Gynecology;  Laterality: N/A;   LAPAROSCOPY     for infertility-endometriosis   LAPAROSCOPY     2nd for infertility   mole removed     precancerous mole  right upper abd   ORIF WRIST FRACTURE Right 08/02/2020   Procedure: Right distal radius open reduction internal fixation;  Surgeon: Verner Mould, MD;  Location: [facility];  Service: Orthopedics;  Laterality: Right;  15mn   TONSILLECTOMY AND ADENOIDECTOMY      Family History  Problem Relation Age of Onset   Renal cancer Father 587      Agent Orange exposure   Hypertension Sister    Hypothyroidism Sister    Other Sister        Wegener's syndrome   Hypertension Brother    Diabetes Maternal Grandmother    Heart attack Paternal Grandfather    Osteoporosis Other    Colon cancer Neg Hx    Breast cancer Neg Hx    Uterine cancer Neg Hx    Ovarian cancer Neg Hx    Endometrial cancer Neg Hx    Prostate cancer Neg Hx    Pancreatic cancer Neg Hx     Social History   Socioeconomic History   Marital status: Married    Spouse  name: Abbe [place]   Number of children: 2   Years of education: Not on file   Highest education level: Not on file  Occupational History   Occupation: retired  Tobacco Use   Smoking status: Former    Types: Cigarettes    Quit date: 12/02/1983    Years since quitting: 38.6   Smokeless tobacco: Never   Tobacco comments:    quit 1985  Vaping Use   Vaping Use: Never used  Substance and Sexual Activity   Alcohol use: Yes    Comment: ocassional   Drug use: No   Sexual activity: Yes    Partners: Male    Birth control/protection: Surgical, Post-menopausal    Comment: BTL  Other Topics Concern   Not on file  Social History Narrative   Not on file   Social Determinants of Health   Financial Resource Strain: Not on file  Food Insecurity: Not on file  Transportation Needs: No Transportation Needs (08/12/2021)   PRAPARE - Hydrologist (Medical): No    Lack of Transportation (Non-Medical): No  Physical Activity: Sufficiently Active (08/12/2021)   Exercise Vital Sign    Days of Exercise per Week: 4 days    Minutes of Exercise per  Session: 60 min  Stress: No Stress Concern Present (08/12/2021)   [place]    Feeling of Stress : Not at all  Social Connections: [place] (08/12/2021)   Social Connection and Isolation Panel [NHANES]    Frequency of Communication with Friends and Family: More than three times a week    Frequency of Social Gatherings with Friends and Family: More than three times a week    Attends Religious Services: More than 4 times per year    Active Member of [employer] or Organizations: Yes    Attends Music therapist: More than 4 times per year    Marital Status: Married    Current Medications:  Current Outpatient Medications:    acetaminophen (TYLENOL) 500 MG tablet, Take 500 mg by mouth every 6 (six) hours as needed for moderate pain or headache., Disp: , Rfl:    Boswellia-Glucosamine-Vit D (OSTEO BI-FLEX ONE PER DAY PO), Take by mouth daily., Disp: , Rfl:    Calcium Carbonate-Vit D-Min (CALTRATE 600+D PLUS MINERALS PO), , Disp: , Rfl:    EPINEPHrine 0.3 mg/0.3 mL IJ SOAJ injection, Inject 0.3 mg as directed as needed (allergic reaction). , Disp: , Rfl:    ibuprofen (ADVIL) 600 MG tablet, Take 1 tablet (600 mg total) by mouth every 6 (six) hours as needed for moderate pain. For AFTER surgery, Disp: 30 tablet, Rfl: 0   letrozole (FEMARA) 2.5 MG tablet, Take 1 tablet (2.5 mg total) by mouth daily., Disp: 60 tablet, Rfl: 6   Multiple Vitamins-Minerals (ADULT GUMMY PO), Take 2 capsules by mouth daily., Disp: , Rfl:    polyethylene glycol (MIRALAX / GLYCOLAX) 17 g packet, Take 17 g by mouth daily., Disp: , Rfl:    senna-docusate (SENOKOT-S) 8.6-50 MG tablet, Take 2 tablets by mouth at bedtime. For AFTER surgery, do not take if having diarrhea, Disp: 30 tablet, Rfl: 0   SUMAtriptan (IMITREX) 100 MG tablet, 1 tab with headache onset.  Can repeat in 2 hours.  Max dosage 298m/24 hours. (Patient taking differently: Take 100  mg by mouth every 2 (two) hours as needed for migraine. Can repeat in 2 hours.  Max dosage 2093m24 hours.), Disp: 27  tablet, Rfl: 4  Review of Systems: Denies appetite changes, fevers, chills, fatigue, unexplained weight changes. Denies hearing loss, neck lumps or masses, mouth sores, ringing in ears or voice changes. Denies cough or wheezing.  Denies shortness of breath. Denies chest pain or palpitations. Denies leg swelling. Denies abdominal distention, pain, blood in stools, constipation, diarrhea, nausea, vomiting, or early satiety. Denies pain with intercourse, dysuria, frequency, hematuria or incontinence. Denies hot flashes, pelvic pain, vaginal bleeding or vaginal discharge.   Denies joint pain, back pain or muscle pain/cramps. Denies itching, rash, or wounds. Denies dizziness, headaches, numbness or seizures. Denies swollen lymph nodes or glands, denies easy bruising or bleeding. Denies anxiety, depression, confusion, or decreased concentration.  Physical Exam: BP (!) 150/102 (BP Location: Left Arm, Patient Position: Sitting)   Pulse 79   Resp 17   Wt 127 lb 4 oz (57.7 kg)   LMP 10/31/2008   SpO2 99%   BMI 21.51 kg/m  General: Alert, oriented, no acute distress. HEENT: Normocephalic, atraumatic, sclera anicteric. Chest: Unlabored breathing on room air. Abdomen: soft, nontender.  Normoactive bowel sounds.  No masses or hepatosplenomegaly appreciated.  Well-healed scar. Extremities: Grossly normal range of motion.  Warm, well perfused.  No edema bilaterally. Skin: No rashes or lesions noted. Lymphatics: No cervical, supraclavicular, or inguinal adenopathy. GU: Normal appearing external genitalia without erythema, excoriation, or lesions.  Speculum exam reveals mildly atrophic vaginal mucosa, no lesions or masses.  Bimanual exam reveals cuff intact, no nodularity or masses.  Rectovaginal exam confirms these findings.  Laboratory & Radiologic Studies: Component Ref Range &  Units 2 mo ago (04/25/22) 5 mo ago (01/27/22) 9 mo ago (10/14/21) 1 yr ago (07/17/21) 1 yr ago (06/04/21) 1 yr ago (05/14/21) 1 yr ago (04/18/21)  Cancer Antigen (CA) 125 0.0 - 38.1 U/mL 10.7  9.9 CM  11.9 CM  116.0 High  CM  963.0 High  CM  877.0 High  CM  771.0 High      Assessment & Plan: Karen Winters is a 71 y.o. 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 Genetic testing shows no somatic or germline mutations.   Patient is overall doing very well and is NED on exam.  CA-125 will be drawn today released to her MyChart tomorrow.   She is on letrozole for maintenance hormonal therapy.  New prescription sent today.  She is tolerating this well without side effects.  She is continued on calcium and vitamin D supplementation.  After meeting with endocrinology, she elected not to pursue additional treatment.  She continues to be very active.  Based on prior discussions, we will plan to repeat bone scan towards the end of the year, 2 years after her last.  Depending on results of this, may elect to change letrozole to a different hormonal agent or stop the medication altogether.   The patient will return for her next surveillance visit in 3 months.  We reviewed signs and symptoms that should prompt a phone call to be seen sooner than that visit.  24 minutes of total time was spent for this patient encounter, including preparation, face-to-face counseling with the patient and coordination of care, and documentation of the encounter.  Jeral Pinch, MD  Division of Gynecologic Oncology  Department of Obstetrics and Gynecology  [facility]

## 2022-07-21 NOTE — Patient Instructions (Signed)
It was good to see you today.  Your tumor marker will released to you tomorrow in [place].  I will see you back for follow-up in 3 months.  Please call if you develop any new and concerning symptoms before your next visit.

## 2022-07-22 LAB — CA 125: Cancer Antigen (CA) 125: 10.5 U/mL (ref 0.0–38.1)

## 2022-09-23 LAB — HM MAMMOGRAPHY

## 2022-10-06 ENCOUNTER — Encounter (HOSPITAL_BASED_OUTPATIENT_CLINIC_OR_DEPARTMENT_OTHER): Payer: Self-pay | Admitting: *Deleted

## 2022-10-24 ENCOUNTER — Ambulatory Visit: Payer: Medicare HMO | Admitting: Gynecologic Oncology

## 2022-10-27 ENCOUNTER — Encounter: Payer: Self-pay | Admitting: Gynecologic Oncology

## 2022-10-28 ENCOUNTER — Inpatient Hospital Stay (HOSPITAL_BASED_OUTPATIENT_CLINIC_OR_DEPARTMENT_OTHER): Payer: Medicare HMO | Admitting: Gynecologic Oncology

## 2022-10-28 ENCOUNTER — Encounter: Payer: Self-pay | Admitting: Gynecologic Oncology

## 2022-10-28 ENCOUNTER — Inpatient Hospital Stay: Payer: Medicare HMO | Attending: Gynecologic Oncology | Admitting: Gynecologic Oncology

## 2022-10-28 VITALS — BP 140/81 | HR 77 | Temp 97.6°F | Resp 16 | Ht 63.98 in | Wt 130.6 lb

## 2022-10-28 DIAGNOSIS — Z90722 Acquired absence of ovaries, bilateral: Secondary | ICD-10-CM | POA: Insufficient documentation

## 2022-10-28 DIAGNOSIS — Z87891 Personal history of nicotine dependence: Secondary | ICD-10-CM | POA: Diagnosis not present

## 2022-10-28 DIAGNOSIS — D3911 Neoplasm of uncertain behavior of right ovary: Secondary | ICD-10-CM

## 2022-10-28 DIAGNOSIS — Z90710 Acquired absence of both cervix and uterus: Secondary | ICD-10-CM | POA: Diagnosis not present

## 2022-10-28 DIAGNOSIS — D3910 Neoplasm of uncertain behavior of unspecified ovary: Secondary | ICD-10-CM

## 2022-10-28 DIAGNOSIS — Z79811 Long term (current) use of aromatase inhibitors: Secondary | ICD-10-CM | POA: Insufficient documentation

## 2022-10-28 DIAGNOSIS — D3912 Neoplasm of uncertain behavior of left ovary: Secondary | ICD-10-CM

## 2022-10-28 DIAGNOSIS — C569 Malignant neoplasm of unspecified ovary: Secondary | ICD-10-CM

## 2022-10-28 DIAGNOSIS — N951 Menopausal and female climacteric states: Secondary | ICD-10-CM | POA: Diagnosis not present

## 2022-10-28 DIAGNOSIS — Z800 Family history of malignant neoplasm of digestive organs: Secondary | ICD-10-CM | POA: Diagnosis not present

## 2022-10-28 DIAGNOSIS — C561 Malignant neoplasm of right ovary: Secondary | ICD-10-CM | POA: Insufficient documentation

## 2022-10-28 DIAGNOSIS — C563 Malignant neoplasm of bilateral ovaries: Secondary | ICD-10-CM | POA: Insufficient documentation

## 2022-10-28 DIAGNOSIS — M818 Other osteoporosis without current pathological fracture: Secondary | ICD-10-CM

## 2022-10-28 NOTE — Patient Instructions (Addendum)
It was good to see you today.  I do not see or feel any evidence of cancer recurrence on your exam.  I will see you for follow-up in 4 months.  We will push this out a little bit because of your trip.  As always, if you develop any new and concerning symptoms before your next visit, please call to see me sooner.

## 2022-10-28 NOTE — Progress Notes (Signed)
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  She was scheduled to see a surgeon about possible repair.  She also endorses intermittent pain in around the area of her right hip that she thought was secondary to her exercise routine.  Over the last week, she has had a decreased appetite.  She denies any nausea or emesis but endorses several months of abdominal bloating and early satiety.  She notes normal bowel function, which she describes as bowel movements at least daily.  She has had some increased urinary frequency and around [place] developed stress urinary incontinence when she runs.     04/04/2021 Imaging   US pelvis  Septate uterus, otherwise unremarkable.   Normal endometrial complex.   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   Recommend further assessment by CT imaging with IV and oral contrast.   04/05/2021 Tumor Marker   Patient's tumor was tested for the following markers: CA-125 Results of the tumor marker test revealed 826.   04/05/2021 Imaging   1. Large bilateral solid-appearing adnexal masses are identified concerning for ovarian neoplasm. There is soft tissue infiltration into the surrounding peritoneal cavity which partially encases nonobstructed loops of small bowel with probable serosal involvement. 2. Moderate volume of ascites identified within the abdomen and pelvis likely secondary to peritoneal carcinomatosis. Diagnostic paracentesis may be helpful for further workup. 3. Mild increased caliber of small bowel loops with a few air-fluid levels. However, there are no signs to suggest a bowel  obstruction as enteric contrast material is noted up to the level of the hepatic flexure.   04/10/2021 Initial Diagnosis   Ovarian cancer ([place])   04/10/2021 Procedure   Successful ultrasound-guided paracentesis yielding 1.4 liters of peritoneal fluid.   04/11/2021 Cancer Staging   Staging form: Ovary, Fallopian Tube, and Primary Peritoneal Carcinoma, AJCC 8th Edition - Clinical stage from 04/11/2021: Stage IIIC (cT3c, cN0, cM0) - Signed by Heath Lark, MD on 04/11/2021 Stage prefix: Initial diagnosis   04/18/2021 Tumor Marker   Patient's tumor was tested for the following markers: CA-125. Results of the tumor marker test revealed 771   04/22/2021 - 06/05/2021 Chemotherapy         05/14/2021 Tumor Marker   Patient's tumor was tested for the following markers: CA-125. Results of the tumor marker test revealed 877.   06/10/2021 Imaging   1. Today's study demonstrates a centrally stable disease when compared to prior examination from 04/05/2021, with large bilateral ovarian lesions and moderate volume of malignant ascites, as detailed above. 2. Trace right pleural effusion lying dependently.     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  Ascites: approximately 2L upon entry and produced during surgery Operative Findings: Small mobile uterus, nodular masses filling the cul de sac. On intra-abdominal  entry, approximately 1.6L of green-tinged ascites was encountered. Normal liver edge, diaphragm, and stomach. Infracolic omentum with a [id number] nodular area and several smaller (1-3cm) deposits c/w tumor. Otherwise, omentum without evidence of disease. Small fibrinous debris on the small bowel, no obvious tumor implants. Mesentery free of disease. No appreciable adenopathy. Bilateral ovaries replaced with vesicular tumors (L>R), measuring approximately 8cm and 12cm. Several areas of plaque (appeared more consistent with treated tumor than active cancer - right pelvic sidewall, posterior cul de sac, and anterior cul de sac) noted, most excised. Uterus 4-6cm in size. Dense adhesions of the bladder to the LUS and cervix, likely combination of prior c-section and treatment effect. R0 resection at the end of surgery if peritoneal plaques not c/w tumor. No clear umbilical hernia.    07/02/2021 Pathology Results   A. 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   - Surface involvement present.  - Fallopian tube involvement present, non-invasive.   B. OVARY AND FALLOPIAN TUBE, LEFT, SALPINGO OOPHORECTOMY:  - Serous borderline tumor.  - Surface involvement present, non-invasive.  - Fallopian tube involvement present, non-invasive.   C. UTERUS AND CERVIX, HYSTERECTOMY:  - Uterus:       Endometrium: Benign endometrial type polyp. Inactive endometrium.  No hyperplasia or malignancy.       Myometrium: Unremarkable. No malignancy.       Serosa: Invasive serous implant.  - Cervix: Benign squamous and endocervical mucosa. No dysplasia or  malignancy.  - Bilateral fallopian tubes: Surface involvement present.   D. OMENTUM:  - Non-invasive serous implant.  - One of one lymph nodes negative for carcinoma (0/1).   ONCOLOGY TABLE:   OVARY or FALLOPIAN TUBE or PRIMARY PERITONEUM: Resection   Procedure: Hysterectomy with  bilateral salpingo-oophorectomy and omental  resection.  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  Other Tissue/ Organ Involvement: Not applicable  Largest Extrapelvic Peritoneal Focus: 2 mm  Peritoneal/Ascitic Fluid Involvement: Not applicable  Chemotherapy Response Score (CRS): Cannot be determined  Regional Lymph Nodes:       Number of Nodes with Metastasis Greater than 10 mm: 0       Number of Nodes with Metastasis 10 mm or Less (excludes isolated  tumor cells): 0       Number of Nodes with Isolated Tumor Cells (0.2 mm or less): 0       Number of Lymph Nodes Examined: 1  Distant Metastasis:       Distant Site(s) Involved: Not applicable  Pathologic Stage Classification (pTNM, AJCC 8th Edition): ypT3a, ypN0  Ancillary Studies: Can be performed upon request  Representative Tumor Block: A7  Comment(s): Given extensive involvement and neoadjuvant therapy it is  difficult to determine the exact primary location, but the right ovary  is slightly favored. Dr. Saralyn Pilar has reviewed the case.    07/02/2021 Pathology Results   FINAL MICROSCOPIC DIAGNOSIS:   A. OVARY AND FALLOPIAN TUBE, RIGHT, SALPINGO OOPHORECTOMY:  - Focal low grade  serous carcinoma arising in a serous borderline tumor.   - Surface involvement present.  - Fallopian tube involvement present, non-invasive.   B. OVARY AND FALLOPIAN TUBE, LEFT, SALPINGO OOPHORECTOMY:  - Serous borderline tumor.  - Surface involvement present, non-invasive.  - Fallopian tube involvement present, non-invasive.   C. UTERUS AND  CERVIX, HYSTERECTOMY:  - Uterus:       Endometrium: Benign endometrial type polyp. Inactive endometrium.  No hyperplasia or malignancy.       Myometrium: Unremarkable. No malignancy.       Serosa: Invasive serous implant.  - Cervix: Benign squamous and endocervical mucosa. No dysplasia or malignancy.  - Bilateral fallopian tubes: Surface involvement present.   D. OMENTUM:  - Non-invasive serous implant.  - One of one lymph nodes negative for carcinoma (0/1).   ONCOLOGY TABLE:   OVARY or FALLOPIAN TUBE or PRIMARY PERITONEUM: Resection   Procedure: Hysterectomy with bilateral salpingo-oophorectomy and omental resection.  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  Other Tissue/ Organ Involvement: Not applicable  Largest Extrapelvic Peritoneal Focus: 2 mm  Peritoneal/Ascitic Fluid Involvement: Not applicable  Chemotherapy Response Score (CRS): Cannot be determined  Regional Lymph Nodes:       Number of Nodes with Metastasis Greater than 10 mm: 0       Number of Nodes with Metastasis 10 mm or Less (excludes isolated  tumor cells): 0       Number of Nodes with Isolated Tumor Cells (0.2 mm or less): 0       Number of Lymph Nodes Examined: 1  Distant Metastasis:       Distant Site(s) Involved: Not applicable  Pathologic Stage Classification (pTNM, AJCC 8th Edition): ypT3a, ypN0  Ancillary Studies: Can be performed upon request  Representative Tumor Block: A7  Comment(s): Given extensive involvement and neoadjuvant therapy it is difficult to determine the exact primary location,  but the right ovary is slightly favored. Dr. Saralyn Pilar has reviewed the case.    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     Ascites: approximately 2L upon entry and produced during surgery Operative Findings: Small mobile uterus, nodular masses filling the cul de sac. On intra-abdominal entry, approximately 1.6L of green-tinged ascites was encountered. Normal liver edge, diaphragm, and stomach. Infracolic omentum with a 6B3AL nodular area and several smaller (1-3cm) deposits c/w tumor. Otherwise, omentum without evidence of disease. Small fibrinous debris on the small bowel, no obvious tumor implants. Mesentery free of disease. No appreciable adenopathy. Bilateral ovaries replaced with vesicular tumors (L>R), measuring approximately 8cm and 12cm. Several areas of plaque (appeared more consistent with treated tumor than active cancer - right pelvic sidewall,  posterior cul de sac, and anterior cul de sac) noted, most excised. Uterus 4-6cm in size. Dense adhesions of the bladder to the LUS and cervix, likely combination of prior c-section and treatment effect. R0 resection at the end of surgery if peritoneal plaques not c/w tumor. No clear umbilical hernia.    07/17/2021 Tumor Marker   Patient's tumor was tested for the following markers: CA-125. Results of the tumor marker test revealed 116.    08/06/2021 Genetic Testing   Negative genetic testing: no pathogenic variants detected in [facility] TumorNext-HRD + CancerNext Panel.  The report date is August 06, 2021.    The CancerNext gene panel offered by [employer] includes sequencing and rearrangement analysis for the following 37 genes:   APC, ATM, AXIN2 BARD1, BMPR1A, BRCA1, BRCA2, BRIP1, CDH1, CDK4, CDKN2A, CHEK2, DICER1, HOXB13, EPCAM, GREM1, MLH1, MRE11A, MSH2, MSH3, MSH6, MUTYH, NBN, NF1, PALB2, PMS2, POLD1, POLE, PTEN, RAD50, RAD51C, RAD51D, RECQL, SMAD4, SMARCA4, STK11, and TP53. TumorNext-HRD is a paired tumor and germline analysis of BRCA1 and BRCA2 plus 9 additional genes in the homologous recombination repair pathway (ATM, BARD1, BRCA1, BRCA2, BRIP1, CHEK2, MRE11A, NBN, PALB2, RAD51C, RAD51D).     Interval History: Patient reports overall doing well.  She denies any abdominal or pelvic pain.  She has some hot flashes although states these are not bothersome.  She endorses normal bowel bladder function.  She and her husband travel to [place] to be with their children for Thanksgiving.  She has a trip in February where they will be traveling to [place], [place], and [place] to see various parents and stepparents.  Past Medical/Surgical History: Past Medical History:  Diagnosis Date   Anemia    Bronchitis    Cancer ([place])    Complication of anesthesia    Endometriosis    Family history of renal cancer 05/09/2021   History of anemia    Migraines    Osteoporosis    PONV (postoperative nausea and vomiting)     Past Surgical History:  Procedure Laterality Date   [place]   and [facility]   COLONOSCOPY     DEBULKING N/A 07/02/2021   Procedure: TUMOR DEBULKING;  Surgeon: Lafonda Mosses, MD;  Location: [facility];  Service: Gynecology;  Laterality: N/A;   HYSTERECTOMY ABDOMINAL WITH SALPINGO-OOPHORECTOMY N/A 07/02/2021   Procedure: HYSTERECTOMY ABDOMINAL WITH BILATERAL SALPINGO-OOPHORECTOMY; OMENTECTOMY;   Surgeon: Lafonda Mosses, MD;  Location: [facility];  Service: Gynecology;  Laterality: N/A;   LAPAROSCOPY     for infertility-endometriosis   LAPAROSCOPY     2nd for infertility   mole removed     precancerous mole right upper abd   ORIF WRIST FRACTURE Right 08/02/2020   Procedure: Right distal radius open reduction internal fixation;  Surgeon: Verner Mould, MD;  Location: [facility];  Service: Orthopedics;  Laterality: Right;  3mn   TONSILLECTOMY AND ADENOIDECTOMY      Family History  Problem Relation Age of Onset   Renal cancer Father 567      Agent Orange exposure   Hypertension Sister    Hypothyroidism Sister    Other Sister        Wegener's syndrome   Hypertension Brother    Diabetes Maternal Grandmother    Heart attack Paternal Grandfather    Osteoporosis Other    Colon cancer Neg Hx    Breast cancer Neg Hx    Uterine cancer Neg Hx    Ovarian cancer Neg Hx  Endometrial cancer Neg Hx    Prostate cancer Neg Hx    Pancreatic cancer Neg Hx     Social History   Socioeconomic History   Marital status: Married    Spouse name: Phil   Number of children: 2   Years of education: Not on file   Highest education level: Not on file  Occupational History   Occupation: retired  Tobacco Use   Smoking status: Former    Types: Cigarettes    Quit date: 12/02/1983    Years since quitting: 38.9   Smokeless tobacco: Never   Tobacco comments:    quit 1985  Vaping Use   Vaping Use: Never used  Substance and Sexual Activity   Alcohol use: Yes    Comment: ocassional   Drug use: No   Sexual activity: Yes    Partners: Male    Birth control/protection: Surgical, Post-menopausal    Comment: BTL  Other Topics Concern   Not on file  Social History Narrative   Not on file   Social Determinants of Health   Financial Resource Strain: Not on file  Food Insecurity: Not on file  Transportation Needs: No Transportation Needs (08/12/2021)   PRAPARE - Civil engineer, contracting (Medical): No    Lack of Transportation (Non-Medical): No  Physical Activity: Sufficiently Active (08/12/2021)   Exercise Vital Sign    Days of Exercise per Week: 4 days    Minutes of Exercise per Session: 60 min  Stress: No Stress Concern Present (08/12/2021)   [place]    Feeling of Stress : Not at all  Social Connections: [place] (08/12/2021)   Social Connection and Isolation Panel [NHANES]    Frequency of Communication with Friends and Family: More than three times a week    Frequency of Social Gatherings with Friends and Family: More than three times a week    Attends Religious Services: More than 4 times per year    Active Member of [employer] or Organizations: Yes    Attends Music therapist: More than 4 times per year    Marital Status: Married    Current Medications:  Current Outpatient Medications:    acetaminophen (TYLENOL) 500 MG tablet, Take 500 mg by mouth every 6 (six) hours as needed for moderate pain or headache., Disp: , Rfl:    Boswellia-Glucosamine-Vit D (OSTEO BI-FLEX ONE PER DAY PO), Take by mouth daily., Disp: , Rfl:    Calcium Carbonate-Vit D-Min (CALTRATE 600+D PLUS MINERALS PO), , Disp: , Rfl:    EPINEPHrine 0.3 mg/0.3 mL IJ SOAJ injection, Inject 0.3 mg as directed as needed (allergic reaction). , Disp: , Rfl:    ibuprofen (ADVIL) 600 MG tablet, Take 1 tablet (600 mg total) by mouth every 6 (six) hours as needed for moderate pain. For AFTER surgery, Disp: 30 tablet, Rfl: 0   letrozole (FEMARA) 2.5 MG tablet, Take 1 tablet (2.5 mg total) by mouth daily., Disp: 60 tablet, Rfl: 6   Multiple Vitamins-Minerals (ADULT GUMMY PO), Take 2 capsules by mouth daily., Disp: , Rfl:    polyethylene glycol (MIRALAX / GLYCOLAX) 17 g packet, Take 17 g by mouth daily., Disp: , Rfl:    senna-docusate (SENOKOT-S) 8.6-50 MG tablet, Take 2 tablets by mouth at bedtime. For AFTER  surgery, do not take if having diarrhea, Disp: 30 tablet, Rfl: 0   SUMAtriptan (IMITREX) 100 MG tablet, 1 tab with headache onset.  Can  repeat in 2 hours.  Max dosage 286m/24 hours. (Patient taking differently: Take 100 mg by mouth every 2 (two) hours as needed for migraine. Can repeat in 2 hours.  Max dosage 2012m24 hours.), Disp: 27 tablet, Rfl: 4  Review of Systems: Denies appetite changes, fevers, chills, fatigue, unexplained weight changes. Denies hearing loss, neck lumps or masses, mouth sores, ringing in ears or voice changes. Denies cough or wheezing.  Denies shortness of breath. Denies chest pain or palpitations. Denies leg swelling. Denies abdominal distention, pain, blood in stools, constipation, diarrhea, nausea, vomiting, or early satiety. Denies pain with intercourse, dysuria, frequency, hematuria or incontinence. Denies pelvic pain, vaginal bleeding or vaginal discharge.   Denies joint pain, back pain or muscle pain/cramps. Denies itching, rash, or wounds. Denies dizziness, headaches, numbness or seizures. Denies swollen lymph nodes or glands, denies easy bruising or bleeding. Denies anxiety, depression, confusion, or decreased concentration.  Physical Exam: BP (!) 140/81 (BP Location: Left Arm, Patient Position: Sitting)   Pulse 77   Temp 97.6 F (36.4 C) (Oral)   Resp 16   Ht 5' 3.98" (1.625 m)   Wt 130 lb 9.6 oz (59.2 kg)   LMP 10/31/2008   SpO2 99%   BMI 22.43 kg/m  General: Alert, oriented, no acute distress. HEENT: Normocephalic, atraumatic, sclera anicteric. Chest: Unlabored breathing on room air.  Lungs are clear to auscultation bilaterally, no wheezes or rhonchi. Cardiovascular: Regular rate and rhythm, no murmurs, rubs, or gallops appreciated. Abdomen: soft, nontender.  Normoactive bowel sounds.  No masses or hepatosplenomegaly appreciated.  Well-healed scar. Extremities: Grossly normal range of motion.  Warm, well perfused.  No edema bilaterally. Skin:  No rashes or lesions noted. Lymphatics: No cervical, supraclavicular, or inguinal adenopathy. GU: Normal appearing external genitalia without erythema, excoriation, or lesions.  Speculum exam reveals mildly atrophic vaginal mucosa, no lesions or masses.  Bimanual exam reveals cuff intact, no nodularity or masses.  Rectovaginal exam confirms these findings.  Laboratory & Radiologic Studies:           Component Ref Range & Units 3 mo ago (07/21/22) 6 mo ago (04/25/22) 9 mo ago (01/27/22) 1 yr ago (10/14/21) 1 yr ago (07/17/21) 1 yr ago (06/04/21) 1 yr ago (05/14/21)  Cancer Antigen (CA) 125 0.0 - 38.1 U/mL 10.5 10.7 CM 9.9 CM 11.9 CM 116.0 High  CM 963.0 High  CM 877.0 High       Assessment & Plan: Karen Winters a [age].o. 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 Genetic testing shows no somatic or germline mutations.   Patient is overall doing very well and is NED on exam.  CA-125 will be drawn today released to her MyChart tomorrow.   She is on letrozole for maintenance hormonal therapy.  She has some hot flashes, although notes that these are not bothersome.  We briefly discussed nonhormonal medications to help treat hot flashes.  She will reach out to me if these become more bothersome.  She continues to take calcium and vitamin D supplementation.  After meeting with endocrinology, she elected not to pursue additional treatment.  She continues to be very active.  It has been 2 years since her last bone scan.  This was scheduled for next month.  Depending on results of this, may elect to change letrozole to a different hormonal agent or stop the medication altogether.  The patient will return for her next surveillance visit in a little over 3 months because of a long road trip that she is taking with her husband in February.  We reviewed signs and  symptoms that should prompt a phone call to be seen sooner than that visit.  20 minutes of total time was spent for this patient encounter, including preparation, face-to-face counseling with the patient and coordination of care, and documentation of the encounter.  Jeral Pinch, MD  Division of Gynecologic Oncology  Department of Obstetrics and Gynecology  [facility]

## 2022-10-29 ENCOUNTER — Telehealth: Payer: Self-pay | Admitting: *Deleted

## 2022-10-29 NOTE — Telephone Encounter (Signed)
Order for dexa fax to [facility]

## 2022-10-30 ENCOUNTER — Encounter: Payer: Self-pay | Admitting: Nurse Practitioner

## 2022-10-30 LAB — CA 125: Cancer Antigen (CA) 125: 13.9 U/mL (ref 0.0–38.1)

## 2022-11-07 ENCOUNTER — Encounter: Payer: Self-pay | Admitting: Gynecologic Oncology

## 2022-11-11 ENCOUNTER — Telehealth: Payer: Self-pay | Admitting: Gynecologic Oncology

## 2022-11-11 DIAGNOSIS — C569 Malignant neoplasm of unspecified ovary: Secondary | ICD-10-CM

## 2022-11-11 MED ORDER — TAMOXIFEN CITRATE 20 MG PO TABS: 20.0000 mg | ORAL_TABLET | Freq: Every day | ORAL | 6 refills | Status: DC

## 2022-11-11 MED ORDER — TAMOXIFEN CITRATE 20 MG PO TABS: 20.0000 mg | ORAL_TABLET | Freq: Two times a day (BID) | ORAL | 6 refills | Status: DC

## 2022-11-11 NOTE — Telephone Encounter (Signed)
Called patient to discuss bone density scan.  There has been some bone density loss in both femurs.  Her estimated FRAX 10-year probability of major osteoporotic fracture is 14% and hip fracture 3.9%.  Discussed option of continuing on aromatase inhibitor with calcium vitamin D supplementation, adding a bisphosphonate, or changing from aromatase inhibitor to tamoxifen.  After discussion, we will plan to transition to tamoxifen.  Jeral Pinch MD Gynecologic Oncology

## 2022-11-11 NOTE — Addendum Note (Signed)
Addended by: Lafonda Mosses on: 11/11/2022 06:00 PM   Modules accepted: Orders

## 2022-11-12 ENCOUNTER — Encounter: Payer: Self-pay | Admitting: Gynecologic Oncology

## 2022-12-09 ENCOUNTER — Encounter: Payer: Self-pay | Admitting: Internal Medicine

## 2023-01-10 IMAGING — US US PELVIS COMPLETE WITH TRANSVAGINAL
1 series · 13 of 25 positions shown · non-contrast
Comparison: None

CLINICAL DATA: Enlarged uterus on physical exam, postmenopausal



[Series 1: us pelvic complete with transvaginal · 86 acquisitions, 13 frames shown]
[im 1/86]
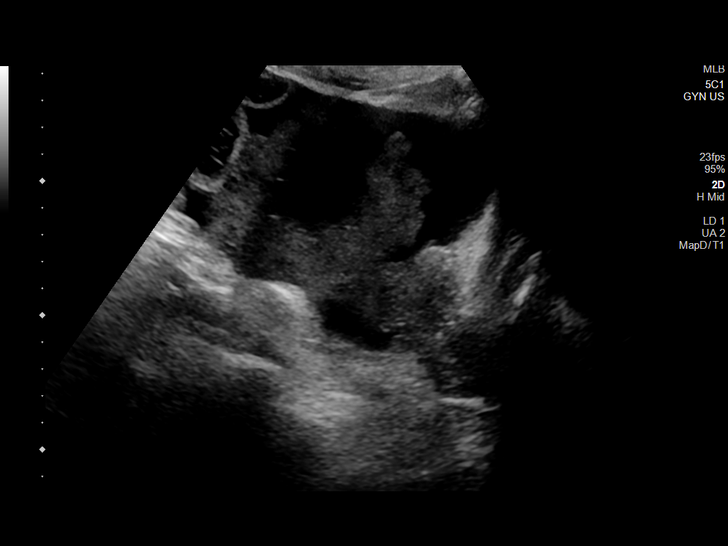
[im 8/86]
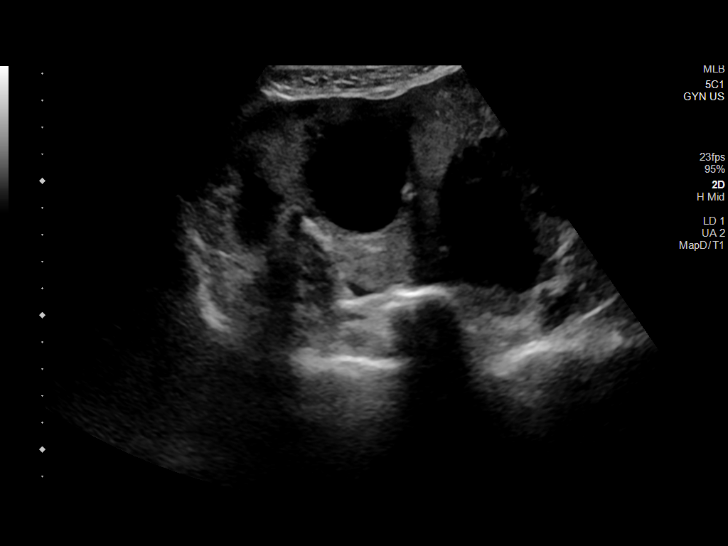
[im 15/86]
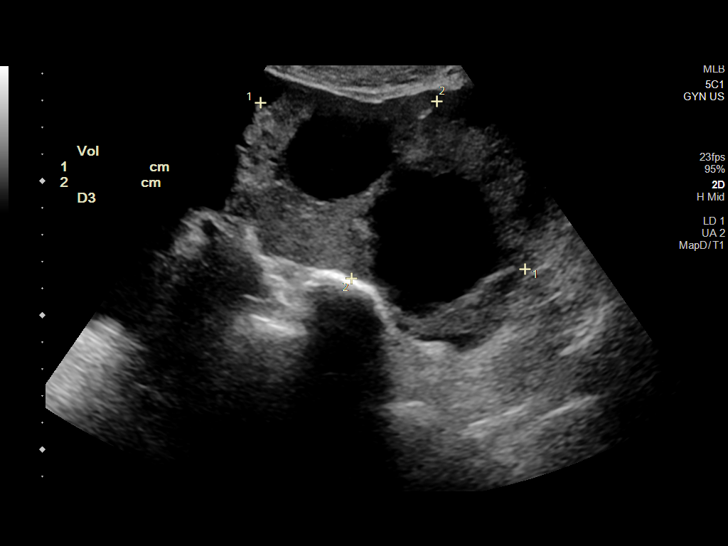
[im 22/86]
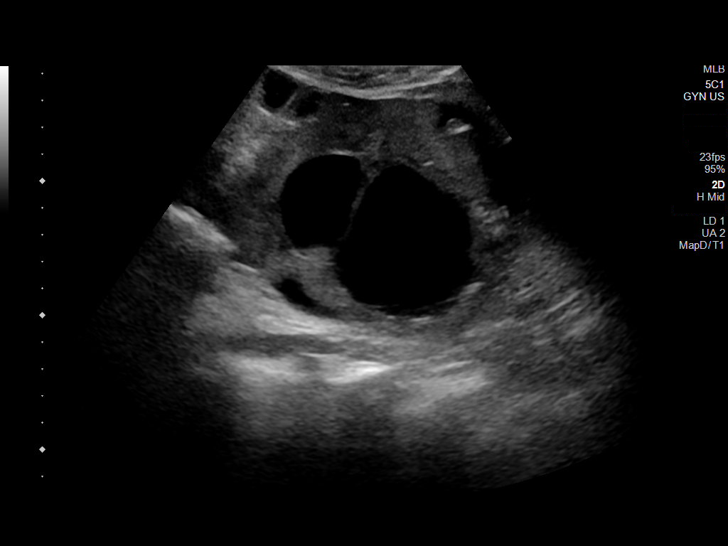
[im 29/86]
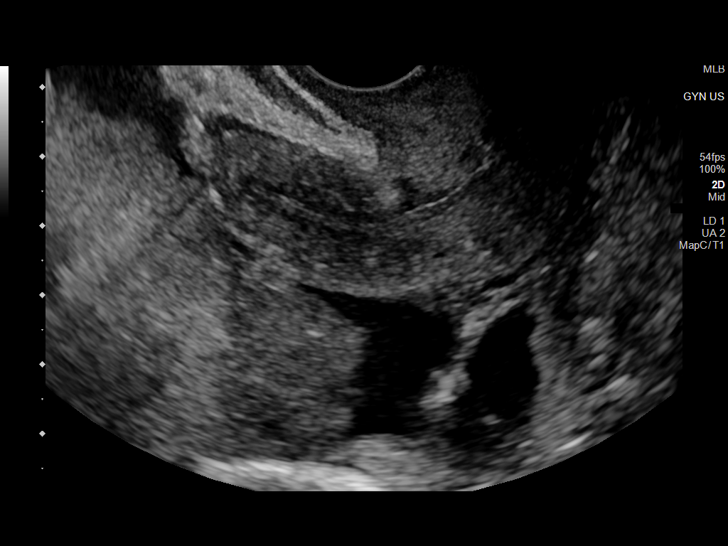
[im 36/86]
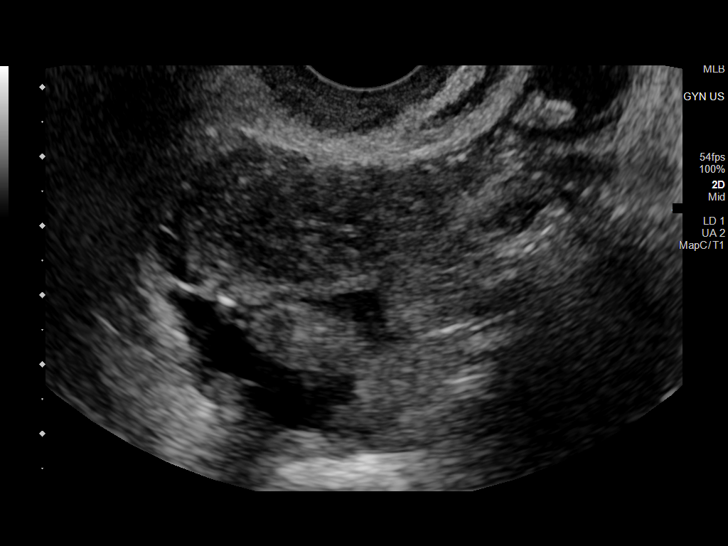
[im 43/86]
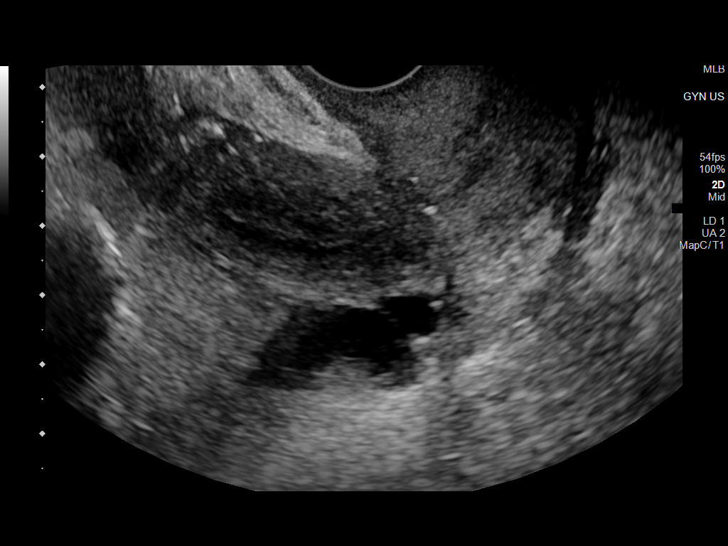
[im 50/86]
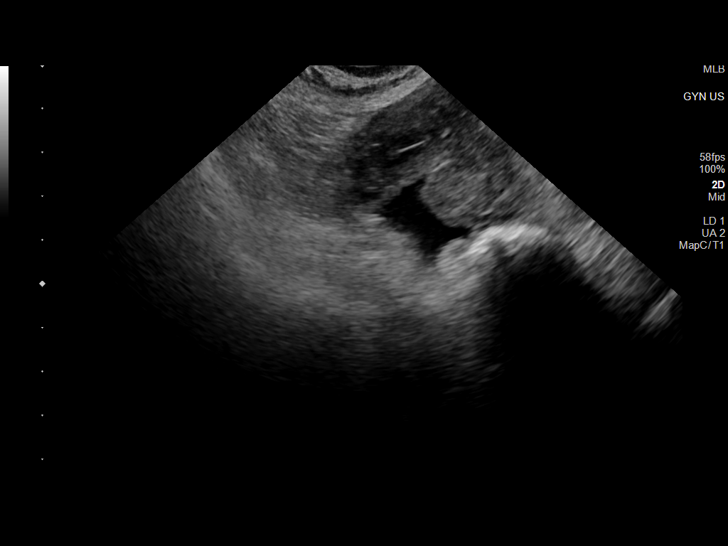
[im 57/86]
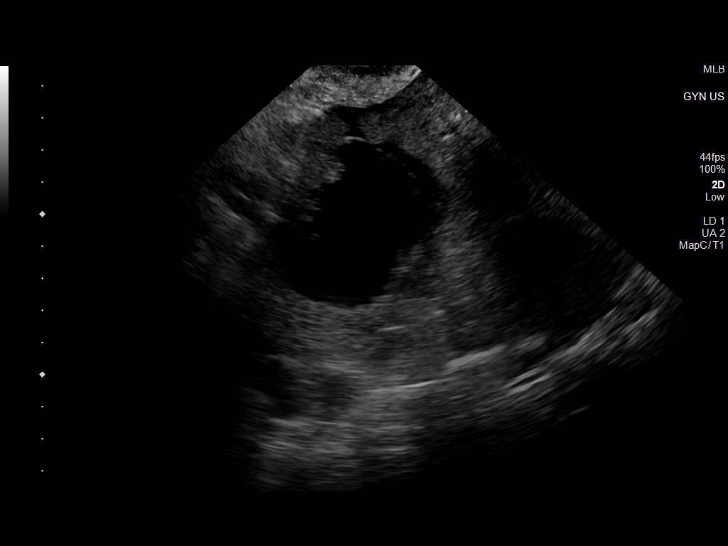
[im 64/86]
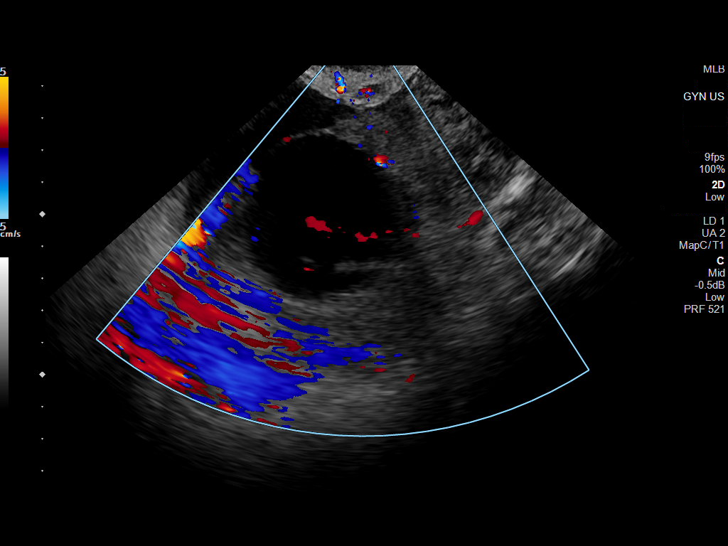
[im 71/86]
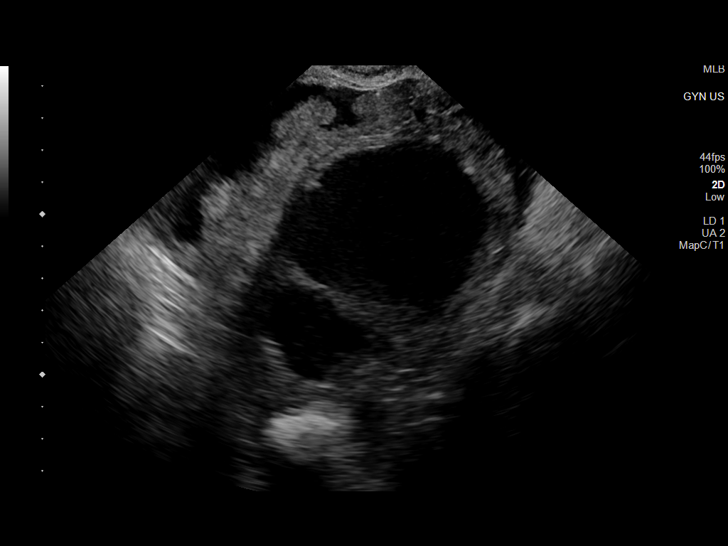
[im 78/86]
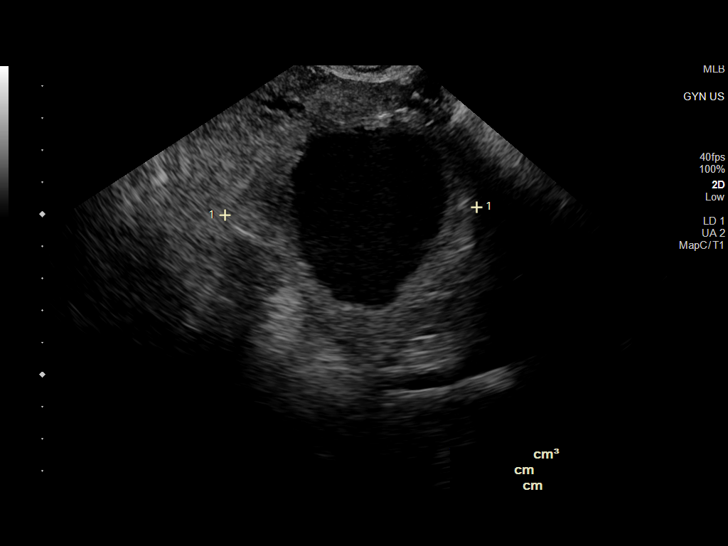
[im 86/86]
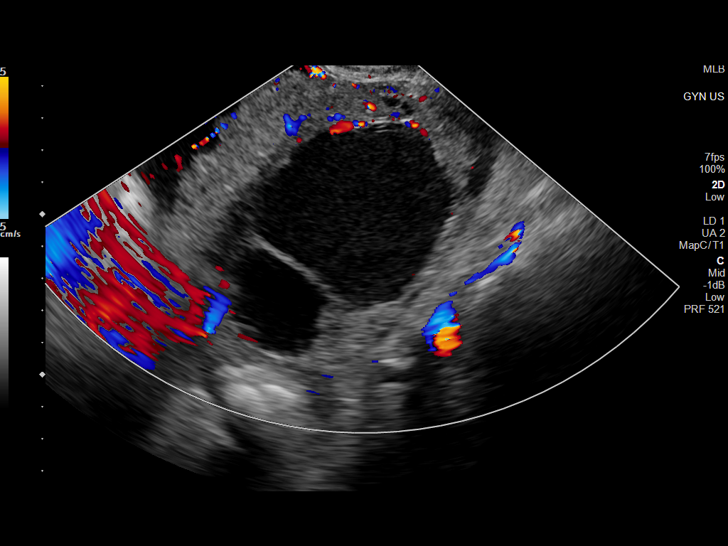

[13 of 25 positions shown; findings below may reference images not displayed]

FINDINGS: Uterus

Measurements: 5.3 x 1.6 x 3.3 cm = volume: 14.6 mL. Atrophic.
Anteverted. Normal morphology without mass

Endometrium

Thickness: 4 mm.  Septate morphology.  No mass or fluid.

Right ovary

No normal appearing RIGHT ovary visualized

Left ovary

No normal appearing RIGHT ovary visualized

Other findings

Moderate ascites. Multiple abnormal complex cystic lesions with
septations and mural nodules. Internal debris within numerous cystic
foci. Abnormal soft tissue in pelvis. Findings are most consistent
with ovarian cancer likely bilaterally with pelvic carcinomatosis
and ascites.
IMPRESSION: Septate uterus, otherwise unremarkable.

Normal endometrial complex.

Complex cystic and solid foci are seen throughout the pelvis, likely
representing cystic ovarian neoplasm with pelvic carcinomatosis and
ascites.

Recommend further assessment by CT imaging with IV and oral
contrast.

These results will be called to the ordering clinician or
representative by the Radiologist Assistant, and communication
documented in the PACS or [REDACTED].

## 2023-01-16 ENCOUNTER — Encounter: Payer: Self-pay | Admitting: Gynecologic Oncology

## 2023-01-23 ENCOUNTER — Ambulatory Visit: Payer: Medicare HMO | Admitting: Gynecologic Oncology

## 2023-02-20 ENCOUNTER — Inpatient Hospital Stay: Payer: Medicare HMO

## 2023-02-20 ENCOUNTER — Encounter: Payer: Self-pay | Admitting: Gynecologic Oncology

## 2023-02-20 ENCOUNTER — Inpatient Hospital Stay: Payer: Medicare HMO | Attending: Gynecologic Oncology | Admitting: Gynecologic Oncology

## 2023-02-20 VITALS — BP 144/89 | HR 70 | Temp 97.4°F | Wt 132.0 lb

## 2023-02-20 DIAGNOSIS — Z79810 Long term (current) use of selective estrogen receptor modulators (SERMs): Secondary | ICD-10-CM | POA: Insufficient documentation

## 2023-02-20 DIAGNOSIS — C569 Malignant neoplasm of unspecified ovary: Secondary | ICD-10-CM

## 2023-02-20 DIAGNOSIS — M810 Age-related osteoporosis without current pathological fracture: Secondary | ICD-10-CM

## 2023-02-20 DIAGNOSIS — D3910 Neoplasm of uncertain behavior of unspecified ovary: Secondary | ICD-10-CM

## 2023-02-20 DIAGNOSIS — Z90722 Acquired absence of ovaries, bilateral: Secondary | ICD-10-CM | POA: Diagnosis not present

## 2023-02-20 DIAGNOSIS — M818 Other osteoporosis without current pathological fracture: Secondary | ICD-10-CM

## 2023-02-20 DIAGNOSIS — Z90710 Acquired absence of both cervix and uterus: Secondary | ICD-10-CM | POA: Diagnosis not present

## 2023-02-20 DIAGNOSIS — C561 Malignant neoplasm of right ovary: Secondary | ICD-10-CM | POA: Diagnosis not present

## 2023-02-20 NOTE — Patient Instructions (Signed)
It was good to see you today.  I do not see or feel any evidence of cancer recurrence on your exam.  I will see you for follow-up in 3 months.  As always, if you develop any new and concerning symptoms before your next visit, please call to see me sooner.  

## 2023-02-20 NOTE — Progress Notes (Signed)
Gynecologic Oncology Return Clinic Visit  02/20/23  Reason for Visit: surveillance  Treatment History: Oncology History Overview Note  Borderline serous carcinoma   Ovarian cancer ([place])  04/01/2021 Initial Diagnosis   She notes that she noticed an umbilical hernia.  She was scheduled to see a surgeon about possible repair.  She also endorses intermittent pain in around the area of her right hip that she thought was secondary to her exercise routine.  Over the last week, she has had a decreased appetite.  She denies any nausea or emesis but endorses several months of abdominal bloating and early satiety.  She notes normal bowel function, which she describes as bowel movements at least daily.  She has had some increased urinary frequency and around [place] developed stress urinary incontinence when she runs.     04/04/2021 Imaging   US pelvis  Septate uterus, otherwise unremarkable.   Normal endometrial complex.   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   Recommend further assessment by CT imaging with IV and oral contrast.   04/05/2021 Tumor Marker   Patient's tumor was tested for the following markers: CA-125 Results of the tumor marker test revealed 826.   04/05/2021 Imaging   1. Large bilateral solid-appearing adnexal masses are identified concerning for ovarian neoplasm. There is soft tissue infiltration into the surrounding peritoneal cavity which partially encases nonobstructed loops of small bowel with probable serosal involvement. 2. Moderate volume of ascites identified within the abdomen and pelvis likely secondary to peritoneal carcinomatosis. Diagnostic paracentesis may be helpful for further workup. 3. Mild increased caliber of small bowel loops with a few air-fluid levels. However, there are no signs to suggest a bowel obstruction as enteric contrast material is noted up to  the level of the hepatic flexure.   04/10/2021 Initial Diagnosis   Ovarian cancer ([place])   04/10/2021 Procedure   Successful ultrasound-guided paracentesis yielding 1.4 liters of peritoneal fluid.   04/11/2021 Cancer Staging   Staging form: Ovary, Fallopian Tube, and Primary Peritoneal Carcinoma, AJCC 8th Edition - Clinical stage from 04/11/2021: Stage IIIC (cT3c, cN0, cM0) - Signed by Heath Lark, MD on 04/11/2021 Stage prefix: Initial diagnosis   04/18/2021 Tumor Marker   Patient's tumor was tested for the following markers: CA-125. Results of the tumor marker test revealed 771   04/22/2021 - 06/05/2021 Chemotherapy         05/14/2021 Tumor Marker   Patient's tumor was tested for the following markers: CA-125. Results of the tumor marker test revealed 877.   06/10/2021 Imaging   1. Today's study demonstrates a centrally stable disease when compared to prior examination from 04/05/2021, with large bilateral ovarian lesions and moderate volume of malignant ascites, as detailed above. 2. Trace right pleural effusion lying dependently.     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     Ascites: approximately 2L upon entry and produced during surgery Operative  Findings: Small mobile uterus, nodular masses filling the cul de sac. On intra-abdominal entry, approximately 1.6L of green-tinged ascites was  encountered. Normal liver edge, diaphragm, and stomach. Infracolic omentum with a [id number] nodular area and several smaller (1-3cm) deposits c/w tumor. Otherwise, omentum without evidence of disease. Small fibrinous debris on the small bowel, no obvious tumor implants. Mesentery free of disease. No appreciable adenopathy. Bilateral ovaries replaced with vesicular tumors (L>R), measuring approximately 8cm and 12cm. Several areas of plaque (appeared more consistent with treated tumor than active cancer - right pelvic sidewall, posterior cul de sac, and anterior cul de sac) noted, most excised. Uterus 4-6cm in size. Dense adhesions of the bladder to the LUS and cervix, likely combination of prior c-section and treatment effect. R0 resection at the end of surgery if peritoneal plaques not c/w tumor. No clear umbilical hernia.    07/02/2021 Pathology Results   A. 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   - Surface involvement present.  - Fallopian tube involvement present, non-invasive.   B. OVARY AND FALLOPIAN TUBE, LEFT, SALPINGO OOPHORECTOMY:  - Serous borderline tumor.  - Surface involvement present, non-invasive.  - Fallopian tube involvement present, non-invasive.   C. UTERUS AND CERVIX, HYSTERECTOMY:  - Uterus:       Endometrium: Benign endometrial type polyp. Inactive endometrium.  No hyperplasia or malignancy.       Myometrium: Unremarkable. No malignancy.       Serosa: Invasive serous implant.  - Cervix: Benign squamous and endocervical mucosa. No dysplasia or  malignancy.  - Bilateral fallopian tubes: Surface involvement present.   D. OMENTUM:  - Non-invasive serous implant.  - One of one lymph nodes negative for carcinoma (0/1).   ONCOLOGY TABLE:   OVARY or FALLOPIAN TUBE or PRIMARY PERITONEUM: Resection   Procedure: Hysterectomy with bilateral salpingo-oophorectomy and omental  resection.   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  Other Tissue/ Organ Involvement: Not applicable  Largest Extrapelvic Peritoneal Focus: 2 mm  Peritoneal/Ascitic Fluid Involvement: Not applicable  Chemotherapy Response Score (CRS): Cannot be determined  Regional Lymph Nodes:       Number of Nodes with Metastasis Greater than 10 mm: 0       Number of Nodes with Metastasis 10 mm or Less (excludes isolated  tumor cells): 0       Number of Nodes with Isolated Tumor Cells (0.2 mm or less): 0       Number of Lymph Nodes Examined: 1  Distant Metastasis:       Distant Site(s) Involved: Not applicable  Pathologic Stage Classification (pTNM, AJCC 8th Edition): ypT3a, ypN0  Ancillary Studies: Can be performed upon request  Representative Tumor Block: A7  Comment(s): Given extensive involvement and neoadjuvant therapy it is  difficult to determine the exact primary location, but the right ovary  is slightly favored. Dr. Saralyn Pilar has reviewed the case.    07/02/2021 Pathology Results   FINAL MICROSCOPIC DIAGNOSIS:   A. 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   -  Surface involvement present.  - Fallopian tube involvement present, non-invasive.   B. OVARY AND FALLOPIAN TUBE, LEFT, SALPINGO OOPHORECTOMY:  - Serous borderline tumor.  - Surface involvement present, non-invasive.  - Fallopian tube involvement present, non-invasive.   C. UTERUS AND CERVIX, HYSTERECTOMY:  - Uterus:       Endometrium:  Benign endometrial type polyp. Inactive endometrium.  No hyperplasia or malignancy.       Myometrium: Unremarkable. No malignancy.       Serosa: Invasive serous implant.  - Cervix: Benign squamous and endocervical mucosa. No dysplasia or malignancy.  - Bilateral fallopian tubes: Surface involvement present.   D. OMENTUM:  - Non-invasive serous implant.  - One of one lymph nodes negative for carcinoma (0/1).   ONCOLOGY TABLE:   OVARY or FALLOPIAN TUBE or PRIMARY PERITONEUM: Resection   Procedure: Hysterectomy with bilateral salpingo-oophorectomy and omental resection.  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  Other Tissue/ Organ Involvement: Not applicable  Largest Extrapelvic Peritoneal Focus: 2 mm  Peritoneal/Ascitic Fluid Involvement: Not applicable  Chemotherapy Response Score (CRS): Cannot be determined  Regional Lymph Nodes:       Number of Nodes with Metastasis Greater than 10 mm: 0       Number of Nodes with Metastasis 10 mm or Less (excludes isolated  tumor cells): 0       Number of Nodes with Isolated Tumor Cells (0.2 mm or less): 0       Number of Lymph Nodes Examined: 1  Distant Metastasis:       Distant Site(s) Involved: Not applicable  Pathologic Stage Classification (pTNM, AJCC 8th Edition): ypT3a, ypN0  Ancillary Studies: Can be performed upon request  Representative Tumor Block: A7  Comment(s): Given extensive involvement and neoadjuvant therapy it is difficult to determine the exact primary location, but the right ovary is slightly favored. Dr. Saralyn Pilar  has reviewed the case.    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     Ascites: approximately 2L upon entry and produced during surgery Operative Findings: Small mobile uterus, nodular masses filling the cul de sac. On intra-abdominal entry, approximately 1.6L of green-tinged ascites was encountered. Normal liver edge, diaphragm, and stomach. Infracolic omentum with a [id number] nodular area and several smaller (1-3cm) deposits c/w tumor. Otherwise, omentum without evidence of disease. Small fibrinous debris on the small bowel, no obvious tumor implants. Mesentery free of disease. No appreciable adenopathy. Bilateral ovaries replaced with vesicular tumors (L>R), measuring approximately 8cm and 12cm. Several areas of plaque (appeared more consistent with treated tumor than active cancer - right pelvic sidewall, posterior cul de sac, and anterior cul de sac) noted, most  excised. Uterus 4-6cm in size. Dense adhesions of the bladder to the LUS and cervix, likely combination of prior c-section and treatment effect. R0 resection at the end of surgery if peritoneal plaques not c/w tumor. No clear umbilical hernia.    07/17/2021 Tumor Marker   Patient's tumor was tested for the following markers: CA-125. Results of the tumor marker test revealed 116.   08/06/2021 Genetic Testing   Negative genetic testing:  no pathogenic variants detected in [facility] TumorNext-HRD + CancerNext Panel.  The report date is August 06, 2021.    The CancerNext gene panel offered by [facility] includes sequencing and rearrangement analysis for the following 37 genes:   APC, ATM, AXIN2 BARD1, BMPR1A, BRCA1, BRCA2, BRIP1, CDH1, CDK4, CDKN2A, CHEK2, DICER1, HOXB13, EPCAM, GREM1, MLH1, MRE11A, MSH2, MSH3, MSH6, MUTYH, NBN, NF1, PALB2, PMS2, POLD1, POLE, PTEN, RAD50, RAD51C, RAD51D, RECQL, SMAD4, SMARCA4, STK11, and TP53. TumorNext-HRD is a paired tumor and germline analysis of BRCA1 and BRCA2 plus 9 additional genes in the homologous recombination repair pathway (ATM, BARD1, BRCA1, BRCA2, BRIP1, CHEK2, MRE11A, NBN, PALB2, RAD51C, RAD51D).     Interval History: Patient reports overall doing very well.  She denies any abdominal or pelvic pain.  Reports baseline bowel bladder function.  Denies any issues with transition from letrozole to tamoxifen.  Hot flashes have stopped completely.  Has gained some weight, thinks this may be related to working out more.  Past Medical/Surgical History: Past Medical History:  Diagnosis Date   Anemia    Bronchitis    Cancer ([place])    Complication of anesthesia    Endometriosis    Family history of renal cancer 05/09/2021   History of anemia    Migraines    Osteoporosis    PONV (postoperative nausea and vomiting)     Past Surgical History:  Procedure Laterality Date   Green   and BTSP   COLONOSCOPY     DEBULKING N/A 07/02/2021   Procedure: TUMOR DEBULKING;  Surgeon: Lafonda Mosses, MD;  Location: [facility];  Service: Gynecology;  Laterality: N/A;   HYSTERECTOMY ABDOMINAL WITH SALPINGO-OOPHORECTOMY N/A 07/02/2021   Procedure: HYSTERECTOMY ABDOMINAL WITH BILATERAL SALPINGO-OOPHORECTOMY; OMENTECTOMY;  Surgeon: Lafonda Mosses, MD;  Location: [facility];  Service: Gynecology;  Laterality: N/A;   LAPAROSCOPY     for infertility-endometriosis   LAPAROSCOPY     2nd  for infertility   mole removed     precancerous mole right upper abd   ORIF WRIST FRACTURE Right 08/02/2020   Procedure: Right distal radius open reduction internal fixation;  Surgeon: Verner Mould, MD;  Location: [facility];  Service: Orthopedics;  Laterality: Right;  13min   TONSILLECTOMY AND ADENOIDECTOMY      Family History  Problem Relation Age of Onset   Renal cancer Father 46       Agent Orange exposure   Hypertension Sister    Hypothyroidism Sister    Other Sister        Wegener's syndrome   Hypertension Brother    Diabetes Maternal Grandmother    Heart attack Paternal Grandfather    Osteoporosis Other    Colon cancer Neg Hx    Breast cancer Neg Hx    Uterine cancer Neg Hx    Ovarian cancer Neg Hx    Endometrial cancer Neg Hx    Prostate cancer Neg Hx    Pancreatic cancer Neg Hx     Social History   Socioeconomic History  Marital status: Married    Spouse name: Abbe [place]   Number of children: 2   Years of education: Not on file   Highest education level: Not on file  Occupational History   Occupation: retired  Tobacco Use   Smoking status: Former    Types: Cigarettes    Quit date: 12/02/1983    Years since quitting: 39.2   Smokeless tobacco: Never   Tobacco comments:    quit 1985  Vaping Use   Vaping Use: Never used  Substance and Sexual Activity   Alcohol use: Yes    Comment: ocassional   Drug use: No   Sexual activity: Yes    Partners: Male    Birth control/protection: Surgical, Post-menopausal    Comment: BTL  Other Topics Concern   Not on file  Social History Narrative   Not on file   Social Determinants of Health   Financial Resource Strain: Not on file  Food Insecurity: Not on file  Transportation Needs: No Transportation Needs (08/12/2021)   PRAPARE - Hydrologist (Medical): No    Lack of Transportation (Non-Medical): No  Physical Activity: Sufficiently Active (08/12/2021)   Exercise Vital Sign    Days of  Exercise per Week: 4 days    Minutes of Exercise per Session: 60 min  Stress: No Stress Concern Present (08/12/2021)   [place]    Feeling of Stress : Not at all  Social Connections: [place] (08/12/2021)   Social Connection and Isolation Panel [NHANES]    Frequency of Communication with Friends and Family: More than three times a week    Frequency of Social Gatherings with Friends and Family: More than three times a week    Attends Religious Services: More than 4 times per year    Active Member of [employer] or Organizations: Yes    Attends Music therapist: More than 4 times per year    Marital Status: Married    Current Medications:  Current Outpatient Medications:    acetaminophen (TYLENOL) 500 MG tablet, Take 500 mg by mouth every 6 (six) hours as needed for moderate pain or headache., Disp: , Rfl:    Boswellia-Glucosamine-Vit D (OSTEO BI-FLEX ONE PER DAY PO), Take by mouth daily., Disp: , Rfl:    Calcium Carbonate-Vit D-Min (CALTRATE 600+D PLUS MINERALS PO), , Disp: , Rfl:    EPINEPHrine 0.3 mg/0.3 mL IJ SOAJ injection, Inject 0.3 mg as directed as needed (allergic reaction). , Disp: , Rfl:    ibuprofen (ADVIL) 600 MG tablet, Take 1 tablet (600 mg total) by mouth every 6 (six) hours as needed for moderate pain. For AFTER surgery, Disp: 30 tablet, Rfl: 0   Multiple Vitamins-Minerals (ADULT GUMMY PO), Take 2 capsules by mouth daily., Disp: , Rfl:    OVER THE COUNTER MEDICATION, Take 100 mg by mouth daily. Natures Made Collagen Gummy, Disp: , Rfl:    OVER THE COUNTER MEDICATION, Take 500 mcg by mouth daily at 2 PM. [place] Source K-2, Disp: , Rfl:    polyethylene glycol (MIRALAX / GLYCOLAX) 17 g packet, Take 17 g by mouth daily., Disp: , Rfl:    senna-docusate (SENOKOT-S) 8.6-50 MG tablet, Take 2 tablets by mouth at bedtime. For AFTER surgery, do not take if having diarrhea, Disp: 30 tablet, Rfl: 0    SUMAtriptan (IMITREX) 100 MG tablet, 1 tab with headache onset.  Can repeat in 2 hours.  Max dosage 200mg /24 hours. (  Patient taking differently: Take 100 mg by mouth every 2 (two) hours as needed for migraine. Can repeat in 2 hours.  Max dosage 200mg /24 hours.), Disp: 27 tablet, Rfl: 4   tamoxifen (NOLVADEX) 20 MG tablet, Take 1 tablet (20 mg total) by mouth daily., Disp: 60 tablet, Rfl: 6  Review of Systems: Denies appetite changes, fevers, chills, fatigue, unexplained weight changes. Denies hearing loss, neck lumps or masses, mouth sores, ringing in ears or voice changes. Denies cough or wheezing.  Denies shortness of breath. Denies chest pain or palpitations. Denies leg swelling. Denies abdominal distention, pain, blood in stools, constipation, diarrhea, nausea, vomiting, or early satiety. Denies pain with intercourse, dysuria, frequency, hematuria or incontinence. Denies hot flashes, pelvic pain, vaginal bleeding or vaginal discharge.   Denies joint pain, back pain or muscle pain/cramps. Denies itching, rash, or wounds. Denies dizziness, headaches, numbness or seizures. Denies swollen lymph nodes or glands, denies easy bruising or bleeding. Denies anxiety, depression, confusion, or decreased concentration.  Physical Exam: BP (!) 144/89 (BP Location: Left Arm, Patient Position: Sitting)   Pulse 70   Temp (!) 97.4 F (36.3 C) (Oral)   Wt 132 lb (59.9 kg)   LMP 10/31/2008   SpO2 100%   BMI 22.67 kg/m  General: Alert, oriented, no acute distress. HEENT: Normocephalic, atraumatic, sclera anicteric. Chest: Unlabored breathing on room air.  Lungs are clear to auscultation bilaterally, no wheezes or rhonchi. Cardiovascular: Regular rate and rhythm, no murmurs, rubs, or gallops appreciated. Abdomen: soft, nontender.  Normoactive bowel sounds.  No masses or hepatosplenomegaly appreciated.  Well-healed scar. Extremities: Grossly normal range of motion.  Warm, well perfused.  No edema  bilaterally. Skin: No rashes or lesions noted. Lymphatics: No cervical, supraclavicular, or inguinal adenopathy. GU: Normal appearing external genitalia without erythema, excoriation, or lesions.  Speculum exam reveals mildly atrophic vaginal mucosa, no lesions or masses, minimal physiologic discharge.  Bimanual exam reveals cuff intact, no nodularity or masses.  Rectovaginal exam confirms these findings.  Laboratory & Radiologic Studies:          Component Ref Range & Units 3 mo ago (10/28/22) 7 mo ago (07/21/22) 10 mo ago (04/25/22) 1 yr ago (01/27/22) 1 yr ago (10/14/21) 1 yr ago (07/17/21) 1 yr ago (06/04/21)  Cancer Antigen (CA) 125 0.0 - 38.1 U/mL 13.9 10.5 CM 10.7 CM 9.9 CM 11.9 CM 116.0 High  CM 963.0 High      10/2022: Bone density scan shows osteoporosis of multiple sites, 2-year follow-up recommended.  Assessment & Plan: Karen Winters is a 72 y.o. 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 Genetic testing shows no somatic or germline mutations. On Tamoxifen maintenance.   Patient is overall doing very well and is NED on exam.  CA-125 will be drawn today released to her MyChart tomorrow.   She continues to take calcium and vitamin D supplementation.  Based on last bone scan at the end of the year, we elected to stop letrozole and start tamoxifen. Hot flashes have stopped since coming off of the letrozole.   The patient will return for her next surveillance visit in 3 months.  We reviewed signs and symptoms that should prompt a phone call to be seen sooner than that visit.  20 minutes of total time was spent for this patient encounter, including preparation, face-to-face counseling with the patient and coordination of care,  and documentation of the encounter.  Jeral Pinch, MD  Division of Gynecologic Oncology  Department of  Obstetrics and Gynecology  [facility]

## 2023-02-21 LAB — CA 125: Cancer Antigen (CA) 125: 11.8 U/mL (ref 0.0–38.1)

## 2023-03-18 IMAGING — CT CT ABD-PELV W/ CM
2 of 5 series · 15 of 46 positions shown, 17 images · IV contrast (APPLIED)
Comparison: CT the abdomen and pelvis 04/05/2021.

CLINICAL DATA: 69-year-old female with history of ovarian cancer.
Evaluate for treatment response.

EXAM:
CT ABDOMEN AND PELVIS WITH CONTRAST
TECHNIQUE: Multidetector CT imaging of the abdomen and pelvis was performed
using the standard protocol following bolus administration of
intravenous contrast.
CONTRAST:  75mL OMNIPAQUE IOHEXOL 300 MG/ML  SOLN

[Series 2: abd pel w · axial · 0.62mm/px · z∈[+784,+1184]mm · 12 of 90 slices shown, 14 images]
[im 5/90  soft-tissue]
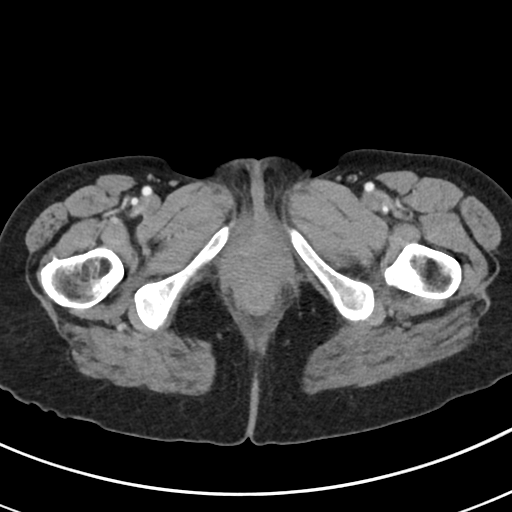
[im 5/90  bone]
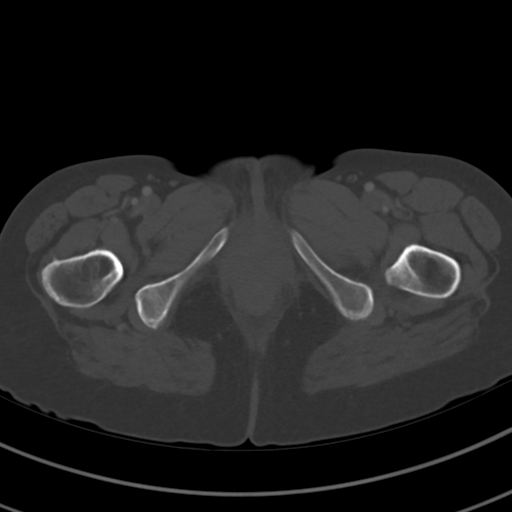
[im 15/90  soft-tissue]
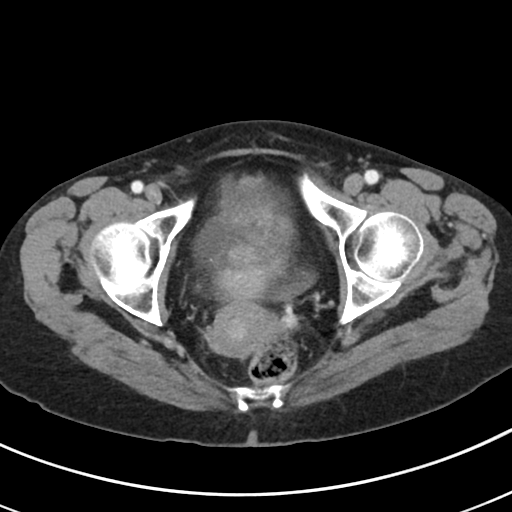
[im 20/90  soft-tissue]
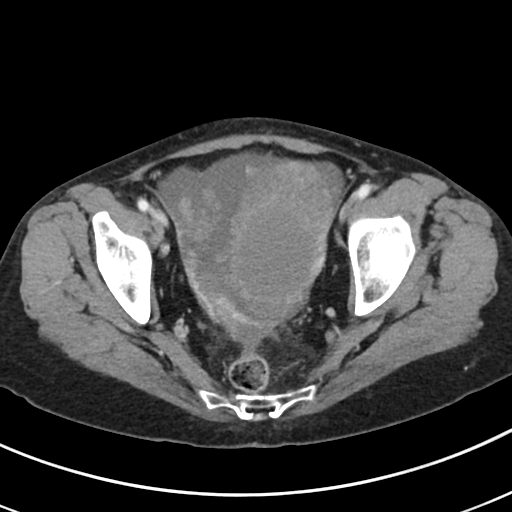
[im 25/90  soft-tissue]
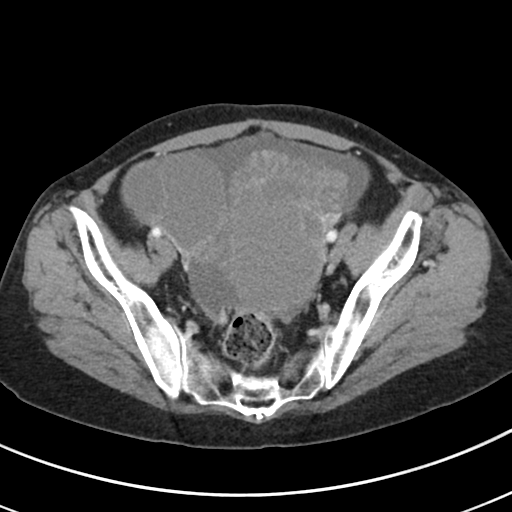
[im 35/90  soft-tissue]
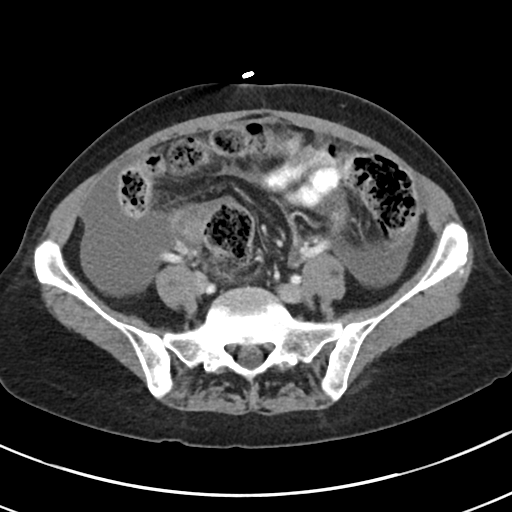
[im 40/90  soft-tissue]
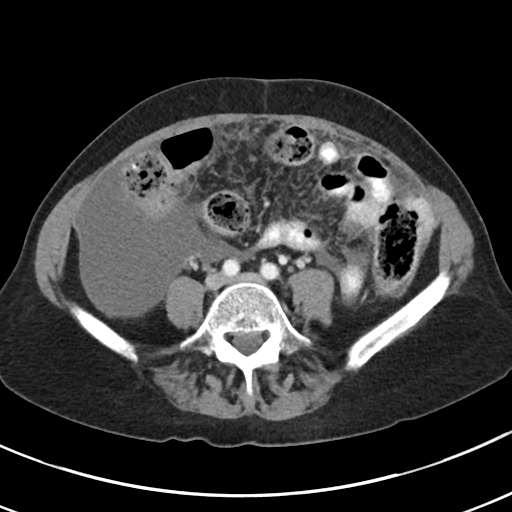
[im 50/90  soft-tissue]
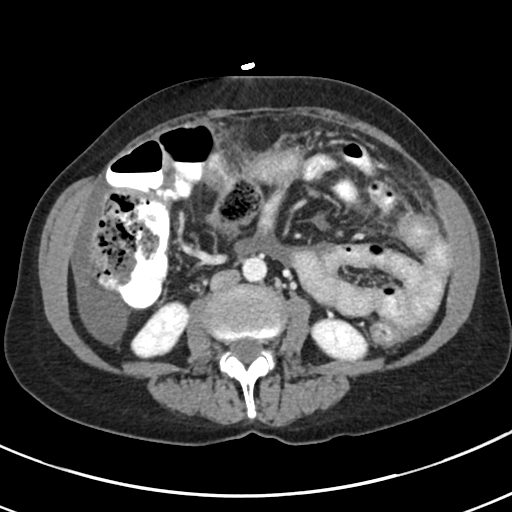
[im 55/90  soft-tissue]
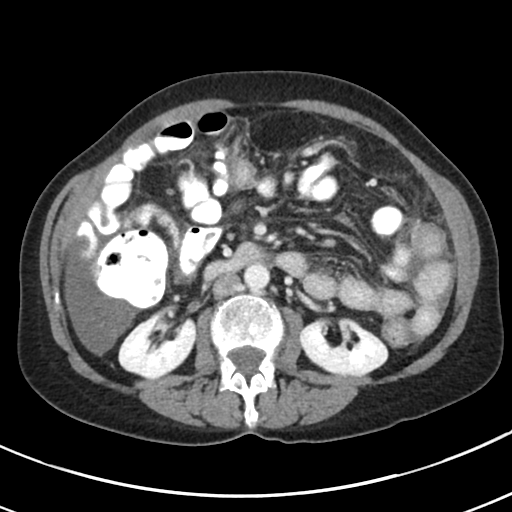
[im 65/90  soft-tissue]
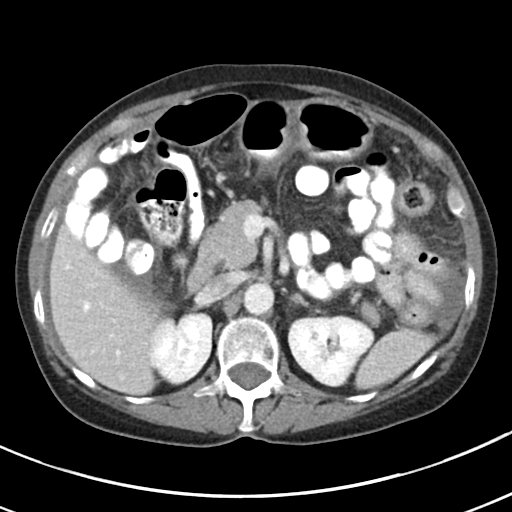
[im 65/90  bone]
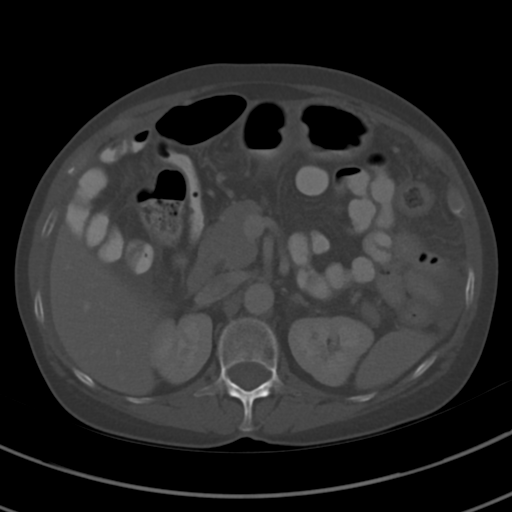
[im 70/90  soft-tissue]
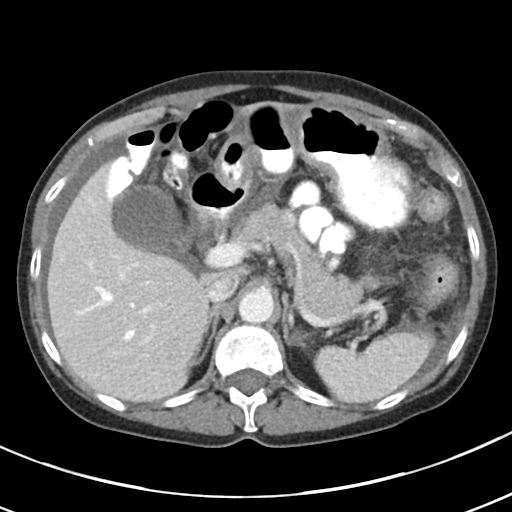
[im 75/90  soft-tissue]
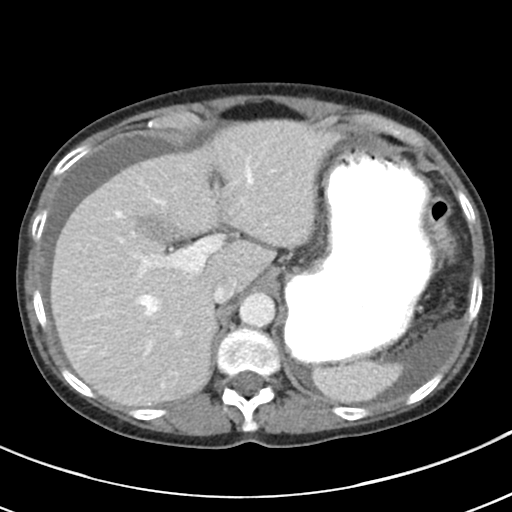
[im 85/90  soft-tissue]
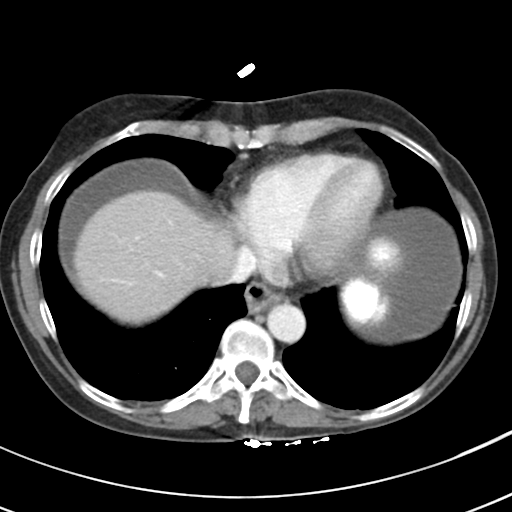

[Series 5: coronal · coronal · 0.68mm/px · 3 of 88 slices shown]
[im 30/88  soft-tissue]
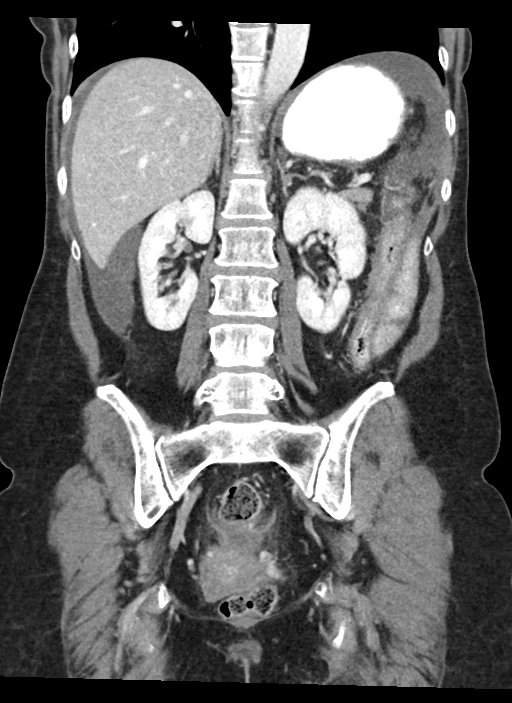
[im 39/88  soft-tissue]
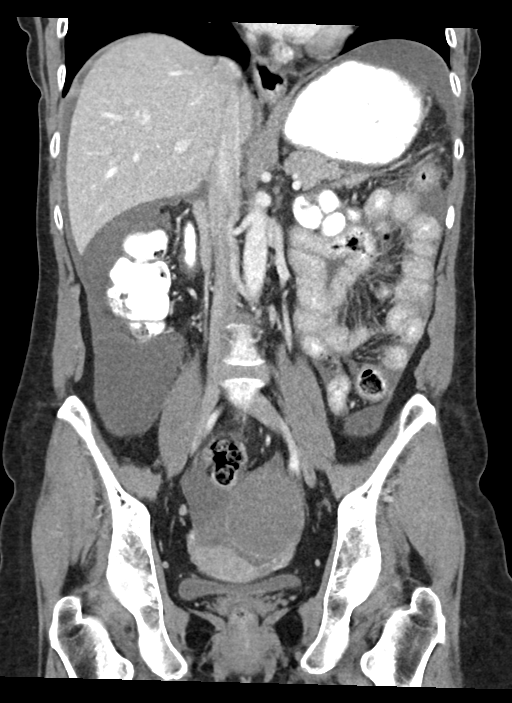
[im 49/88  soft-tissue]
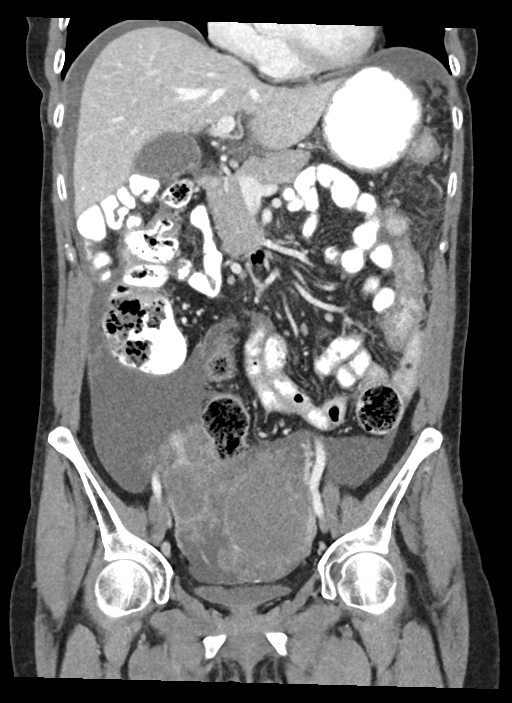

[15 of 46 positions shown; findings below may reference images not displayed]

FINDINGS: Lower chest: Trace right pleural effusion lying dependently.

Hepatobiliary: No suspicious cystic or solid hepatic lesions. No
intra or extrahepatic biliary ductal dilatation. Gallbladder is
normal in appearance.

Pancreas: No pancreatic mass. No pancreatic ductal dilatation. No
pancreatic or peripancreatic fluid collections or inflammatory
changes.

Spleen: Unremarkable.

Adrenals/Urinary Tract: Bilateral kidneys and adrenal glands are
normal in appearance. No hydroureteronephrosis. Urinary bladder is
nearly completely decompressed, but otherwise unremarkable in
appearance.

Stomach/Bowel: The appearance of the stomach is normal. There is no
pathologic dilatation of small bowel or colon. Mural thickening and
luminal narrowing noted in the transverse colon and descending
colon, which may be subjective in light of complete decompression of
these portions of the colon, although this may reflect serosal
implants. Appendix is not confidently identified.

Vascular/Lymphatic: No significant atherosclerotic disease, aneurysm
or dissection noted in the abdominal or pelvic vasculature. No
lymphadenopathy noted in the abdomen or pelvis.

Reproductive: Bilateral ovaries are markedly enlarged with multiple
complex lesions. Direct comparison with a prior noncontrast CT
examination is challenging, but the largest of these lesions appear
stable to minimally increased in size bilaterally, currently
measuring 6.6 x 5.6 x 5.7 cm on the right (previously 6.7 x 5.3 cm)
and 7.4 x 5.9 x 6.7 cm on the left (previously 7.2 x 5.8 cm).
Surrounding these dominant cystic lesions there are multiple smaller
solid appearing lesions in both ovaries which are more conspicuous
on today's contrast enhanced examination than the prior study.
Uterus is unremarkable in appearance.

Other: Moderate volume of ascites with areas of some very mild
peritoneal thickening and enhancement most evident in the right
pericolic gutter. No pneumoperitoneum.

Musculoskeletal: There are no aggressive appearing lytic or blastic
lesions noted in the visualized portions of the skeleton.
IMPRESSION: 1. Today's study demonstrates a centrally stable disease when
compared to prior examination from 04/05/2021, with large bilateral
ovarian lesions and moderate volume of malignant ascites, as
detailed above.
2. Trace right pleural effusion lying dependently.

## 2023-03-26 ENCOUNTER — Encounter (HOSPITAL_BASED_OUTPATIENT_CLINIC_OR_DEPARTMENT_OTHER): Payer: Medicare HMO | Admitting: Nurse Practitioner

## 2023-04-14 ENCOUNTER — Encounter: Payer: Self-pay | Admitting: Nurse Practitioner

## 2023-04-14 ENCOUNTER — Encounter (HOSPITAL_BASED_OUTPATIENT_CLINIC_OR_DEPARTMENT_OTHER): Payer: Medicare HMO | Admitting: Nurse Practitioner

## 2023-04-14 ENCOUNTER — Ambulatory Visit (INDEPENDENT_AMBULATORY_CARE_PROVIDER_SITE_OTHER): Payer: Medicare HMO | Admitting: Nurse Practitioner

## 2023-04-14 VITALS — BP 124/78 | HR 80 | Ht 65.0 in | Wt 134.6 lb

## 2023-04-14 DIAGNOSIS — Z23 Encounter for immunization: Secondary | ICD-10-CM

## 2023-04-14 DIAGNOSIS — G43009 Migraine without aura, not intractable, without status migrainosus: Secondary | ICD-10-CM | POA: Diagnosis not present

## 2023-04-14 DIAGNOSIS — M25551 Pain in right hip: Secondary | ICD-10-CM

## 2023-04-14 DIAGNOSIS — Z0000 Encounter for general adult medical examination without abnormal findings: Secondary | ICD-10-CM | POA: Diagnosis not present

## 2023-04-14 DIAGNOSIS — M25552 Pain in left hip: Secondary | ICD-10-CM

## 2023-04-14 DIAGNOSIS — T6391XS Toxic effect of contact with unspecified venomous animal, accidental (unintentional), sequela: Secondary | ICD-10-CM

## 2023-04-14 DIAGNOSIS — Z79899 Other long term (current) drug therapy: Secondary | ICD-10-CM

## 2023-04-14 HISTORY — DX: Pain in left hip: M25.551

## 2023-04-14 LAB — CBC WITH DIFFERENTIAL/PLATELET
Basos: 0 %
Hemoglobin: 13.9 g/dL (ref 11.1–15.9)
Immature Granulocytes: 0 %
Lymphs: 38 %
MCHC: 33.9 g/dL (ref 31.5–35.7)
MCV: 93 fL (ref 79–97)
Neutrophils Absolute: 3.4 10*3/uL (ref 1.4–7.0)
Platelets: 192 10*3/uL (ref 150–450)
RBC: 4.43 x10E6/uL (ref 3.77–5.28)

## 2023-04-14 LAB — VITAMIN D 25 HYDROXY (VIT D DEFICIENCY, FRACTURES)

## 2023-04-14 LAB — COMPREHENSIVE METABOLIC PANEL

## 2023-04-14 LAB — LIPID PANEL

## 2023-04-14 LAB — TSH

## 2023-04-14 MED ORDER — DICLOFENAC SODIUM 1 % EX GEL: 4.0000 g | Freq: Four times a day (QID) | CUTANEOUS | 3 refills | Status: DC | PRN

## 2023-04-14 MED ORDER — ZOSTER VAC RECOMB ADJUVANTED 50 MCG/0.5ML IM SUSR: 0.5000 mL | Freq: Once | INTRAMUSCULAR | 1 refills | Status: AC

## 2023-04-14 MED ORDER — EPINEPHRINE 0.3 MG/0.3ML IJ SOAJ: 0.3000 mg | INTRAMUSCULAR | 6 refills | Status: DC | PRN

## 2023-04-14 MED ORDER — SUMATRIPTAN SUCCINATE 100 MG PO TABS
ORAL_TABLET | ORAL | 4 refills | Status: AC
Start: 2023-04-14 — End: ?

## 2023-04-14 NOTE — Progress Notes (Signed)
Primary Care & Sports Medicine [facility] [address]  [address] [place], [place]  [address] [phone]   MEDICARE Karen Winters VISIT  04/14/2023  Subjective:  Karen Winters is a 72 y.o. female patient of Kanasia Gayman, Sung Amabile, NP who had a Medicare Annual Wellness Visit today. Karen Winters is Retired and lives with their spouse. she has adult children. she reports that she is socially active and does interact with friends/family regularly. she is markedly physically active and enjoys hiking, reading (4 book clubs), croquet, corresponding, running.   Patient Care Team: Vonna Brabson, Sung Amabile, NP as PCP - General (Nurse Practitioner)     04/14/2023    8:35 AM 04/18/2022    3:34 PM 10/09/2021   10:12 AM 07/02/2021    9:03 AM 06/25/2021   10:08 AM 06/05/2021   11:20 AM 08/01/2020    8:22 AM  Advanced Directives  Does Patient Have a Medical Advance Directive? Yes Yes Yes Yes Yes Yes Yes  Type of Estate agent of [place];Living will Living will Living will Living will;Healthcare Power of Attorney Living will;Healthcare Power of [employer] Power of [place];Living will Healthcare Power of [place];Living will  Does patient want to make changes to medical advance directive? Yes (ED - Information included in AVS) No - Patient declined No - Guardian declined   No - Patient declined No - Patient declined  Copy of Healthcare Power of Attorney in Chart? No - copy requested   No - copy requested  No - copy requested No - copy requested    Hospital Utilization Over the Past 12 Months: # of hospitalizations or ER visits: 0 # of surgeries: 0  Review of Systems    Patient reports that her overall health is unchanged when compared to last year.  Review of Systems: Negative except bilateral hip pain  All other systems negative.  Pain Assessment Hip Pain 3-4/10- "doesn't stop me from doing anything" Otherwise no pain   Current  Medications & Allergies (verified) Allergies as of 04/14/2023       Reactions   Bee Venom Swelling   Latex Other (See Comments)   Redness, itching   Mushroom Extract Complex Nausea And Vomiting, Other (See Comments)   Diarrhea   Penicillins Hives   Reaction:  72 years old   Tape Itching   Redness        Medication List        Accurate as of Apr 14, 2023  6:12 PM. If you have any questions, ask your nurse or doctor.          acetaminophen 500 MG tablet Commonly known as: TYLENOL Take 500 mg by mouth every 6 (six) hours as needed for moderate pain or headache.   ADULT GUMMY PO Take 2 capsules by mouth daily.   CALTRATE 600+D PLUS MINERALS PO   diclofenac Sodium 1 % Gel Commonly known as: VOLTAREN Apply 4 g topically 4 (four) times daily as needed. For hip pain. Started by: Tollie Eth, NP   EPINEPHrine 0.3 mg/0.3 mL Soaj injection Commonly known as: EPI-PEN Inject 0.3 mg into the muscle as needed (allergic reaction). What changed: how to take this Changed by: Tollie Eth, NP   ibuprofen 600 MG tablet Commonly known as: ADVIL Take 1 tablet (600 mg total) by mouth every 6 (six) hours as needed for moderate pain. For AFTER surgery   magnesium 30 MG tablet Take 30 mg by mouth 2 (two) times  daily.   OSTEO BI-FLEX ONE PER DAY PO Take by mouth daily.   OVER THE COUNTER MEDICATION Take 100 mg by mouth daily. Natures Made Collagen Gummy   OVER THE COUNTER MEDICATION Take 500 mcg by mouth daily at 2 PM. [employer] Source K-2   polyethylene glycol 17 g packet Commonly known as: MIRALAX / GLYCOLAX Take 17 g by mouth daily.   senna-docusate 8.6-50 MG tablet Commonly known as: Senokot-S Take 2 tablets by mouth at bedtime. For AFTER surgery, do not take if having diarrhea   SUMAtriptan 100 MG tablet Commonly known as: IMITREX 1 tab with headache onset.  Can repeat in 2 hours.  Max dosage 200mg /24 hours. What changed:  how much to take how to take this when  to take this reasons to take this additional instructions   tamoxifen 20 MG tablet Commonly known as: NOLVADEX Take 1 tablet (20 mg total) by mouth daily.   Zoster Vaccine Adjuvanted injection Commonly known as: SHINGRIX Inject 0.5 mLs into the muscle once for 1 dose. Repeat in 2-6 months. Please fax confirmation of vaccination to Shawna Clamp, DNP at [phone] Started by: Tollie Eth, NP       Physical Exam Vitals and nursing note reviewed.  Constitutional:      Appearance: Normal appearance.  HENT:     Head: Normocephalic.     Right Ear: Tympanic membrane normal.     Left Ear: Tympanic membrane normal.     Nose: Nose normal.     Mouth/Throat:     Mouth: Mucous membranes are moist.     Pharynx: Oropharynx is clear.  Eyes:     Extraocular Movements: Extraocular movements intact.     Pupils: Pupils are equal, round, and reactive to light.  Neck:     Vascular: No carotid bruit.  Cardiovascular:     Rate and Rhythm: Normal rate and regular rhythm.     Pulses: Normal pulses.     Heart sounds: Normal heart sounds.  Pulmonary:     Effort: Pulmonary effort is normal.     Breath sounds: Normal breath sounds.  Abdominal:     General: Bowel sounds are normal.     Palpations: Abdomen is soft.  Musculoskeletal:        General: Normal range of motion.     Cervical back: Normal range of motion.     Right lower leg: No edema.     Left lower leg: No edema.  Lymphadenopathy:     Cervical: No cervical adenopathy.  Skin:    General: Skin is warm and dry.     Capillary Refill: Capillary refill takes less than 2 seconds.  Neurological:     General: No focal deficit present.     Mental Status: She is alert and oriented to person, place, and time.  Psychiatric:        Mood and Affect: Mood normal.      History (reviewed): Past Medical History:  Diagnosis Date   Allergy 1964   Anemia    Bronchitis    Cancer (HCC)    Complication of anesthesia    Endometriosis     Family history of renal cancer 05/09/2021   History of anemia    Migraines    Osteoporosis    PONV (postoperative nausea and vomiting)    Past Surgical History:  Procedure Laterality Date   ABDOMINAL HYSTERECTOMY  July 02, 2021   CESAREAN SECTION  1986, 1990   and BTSP   COLONOSCOPY  DEBULKING N/A 07/02/2021   Procedure: TUMOR DEBULKING;  Surgeon: Carver Fila, MD;  Location: [facility];  Service: Gynecology;  Laterality: N/A;   FRACTURE SURGERY  Sept 2,2021   HYSTERECTOMY ABDOMINAL WITH SALPINGO-OOPHORECTOMY N/A 07/02/2021   Procedure: HYSTERECTOMY ABDOMINAL WITH BILATERAL SALPINGO-OOPHORECTOMY; OMENTECTOMY;  Surgeon: Carver Fila, MD;  Location: [facility];  Service: Gynecology;  Laterality: N/A;   LAPAROSCOPY     for infertility-endometriosis   LAPAROSCOPY     2nd for infertility   mole removed     precancerous mole right upper abd   ORIF WRIST FRACTURE Right 08/02/2020   Procedure: Right distal radius open reduction internal fixation;  Surgeon: Ernest Mallick, MD;  Location: [facility];  Service: Orthopedics;  Laterality: Right;    TONSILLECTOMY AND ADENOIDECTOMY     TUBAL LIGATION  O7/28/1989   Family History  Problem Relation Age of Onset   Renal cancer Father 36       Agent Orange exposure   Cancer Father    Hypertension Sister    Hypothyroidism Sister    Other Sister        Wegener's syndrome   Hypertension Brother    Diabetes Maternal Grandmother    Heart attack Paternal Grandfather    Osteoporosis Other    Colon cancer Neg Hx    Breast cancer Neg Hx    Uterine cancer Neg Hx    Ovarian cancer Neg Hx    Endometrial cancer Neg Hx    Prostate cancer Neg Hx    Pancreatic cancer Neg Hx    Social History   Socioeconomic History   Marital status: Married    Spouse name: Phil   Number of children: 2   Years of education: Not on file   Highest education level: Not on file  Occupational History   Occupation: retired  Tobacco Use   Smoking  status: Former    Packs/day: 1.00    Years: 14.00    Additional pack years: 0.00    Total pack years: 14.00    Types: Cigarettes    Quit date: 12/02/1983    Years since quitting: 39.3   Smokeless tobacco: Never   Tobacco comments:    I have not smoked in 37 years.  Vaping Use   Vaping Use: Never used  Substance and Sexual Activity   Alcohol use: Yes    Comment: Maybe 2 a month.   Drug use: No   Sexual activity: Yes    Partners: Male    Birth control/protection: Post-menopausal, Surgical    Comment: BTL  Other Topics Concern   Not on file  Social History Narrative   Not on file   Social Determinants of Health   Financial Resource Strain: Not on file  Food Insecurity: Not on file  Transportation Needs: No Transportation Needs (08/12/2021)   PRAPARE - Administrator, Civil Service (Medical): No    Lack of Transportation (Non-Medical): No  Physical Activity: Sufficiently Active (08/12/2021)   Exercise Vital Sign    Days of Exercise per Week: 4 days    Minutes of Exercise per Session: 60 min  Stress: No Stress Concern Present (08/12/2021)   [employer] of Occupational Health - Occupational Stress Questionnaire    Feeling of Stress : Not at all  Social Connections: Socially Integrated (08/12/2021)   Social Connection and Isolation Panel [NHANES]    Frequency of Communication with Friends and Family: More than three times a week  Frequency of Social Gatherings with Friends and Family: More than three times a week    Attends Religious Services: More than 4 times per year    Active Member of [employer] or Organizations: Yes    Attends Banker Meetings: More than 4 times per year    Marital Status: Married    Activities of Daily Living    04/14/2023    8:35 AM  In your present state of health, do you have any difficulty performing the following activities:  Hearing? 0  Vision? 0  Difficulty concentrating or making decisions? 0  Walking or climbing  stairs? 0  Dressing or bathing? 0  Doing errands, shopping? 0  Preparing Food and eating ? N  Using the Toilet? N  In the past six months, have you accidently leaked urine? N  Do you have problems with loss of bowel control? N  Managing your Medications? N  Managing your Finances? N  Housekeeping or managing your Housekeeping? N    Patient Education/Literacy    Exercise Current Exercise Habits: Home exercise routine;Structured exercise class, Type of exercise: strength training/weights;walking;stretching;Other - see comments (running), Time (Minutes): > 60 (240), Frequency (Times/Week): 7, Weekly Exercise (Minutes/Week): 0, Intensity: Moderate  Diet Patient reports consuming 3 meals a day and 1 snack(s) a day Patient reports that her primary diet is:  High Protein and High Calcium Patient reports that she does have regular access to food.   Depression Screen    04/14/2023    8:33 AM 03/24/2022   10:17 AM 08/12/2021   11:22 AM  PHQ 2/9 Scores  PHQ - 2 Score 0 0 0  PHQ- 9 Score   0  Exception Documentation  Medical reason      Fall Risk    04/14/2023    8:33 AM 03/24/2022   10:17 AM 08/12/2021   11:21 AM  Fall Risk   Falls in the past year? 0 0 0  Number falls in past yr: 0 0 0  Injury with Fall? 0 0 0  Risk for fall due to : No Fall Risks No Fall Risks No Fall Risks  Follow up Falls evaluation completed Falls evaluation completed;Education provided Falls evaluation completed;Falls prevention discussed     Objective:   BP 124/78   Pulse 80   Ht 5\' 5"  (1.651 m)   Wt 134 lb 9.6 oz (61.1 kg)   LMP 10/31/2008   BMI 22.40 kg/m   Last Weight  Most recent update: 04/14/2023  8:42 AM    Weight  61.1 kg (134 lb 9.6 oz)             Body mass index is 22.4 kg/m.  Hearing/Vision  Tannia did not have difficulty with hearing/understanding during the face-to-face interview Sherron did not have difficulty with her vision during the face-to-face interview Reports that she  has had a formal eye exam by an eye care professional within the past year Reports that she has not had a formal hearing evaluation within the past year  Cognitive Function:     No data to display          Normal Cognitive Function Screening: Yes (Normal:0-7, Significant for Dysfunction: >8)  Immunization & Health Maintenance Record Immunization History  Administered Date(s) Administered   Fluad Quad(high Dose 65+) 09/29/2019   Influenza Inj Mdck Quad Pf 10/07/2018   Influenza, High Dose Seasonal PF 09/29/2019   Influenza,inj,Quad PF,6+ Mos 10/07/2018   Influenza-Unspecified 10/07/2018, 09/07/2022   PFIZER Comirnaty(Gray Top)Covid-19  Tri-Sucrose Vaccine 12/22/2019, 01/12/2020, 10/09/2020   PFIZER(Purple Top)SARS-COV-2 Vaccination 12/22/2019, 01/12/2020   Pneumococcal Conjugate-13 09/20/2019, 09/21/2020   Tdap 08/15/2014    Health Maintenance  Topic Date Due   Zoster Vaccines- Shingrix (1 of 2) Never done   Pneumonia Vaccine 17+ Years old (2 of 2 - PPSV23 or PCV20) 11/16/2020   COVID-19 Vaccine (6 - 2023-24 season) 08/01/2022   INFLUENZA VACCINE  07/02/2023   Medicare Annual Wellness (AWV)  04/13/2024   DTaP/Tdap/Td (2 - Td or Tdap) 08/15/2024   MAMMOGRAM  09/23/2024   COLONOSCOPY (Pts 45-53yrs Insurance coverage will need to be confirmed)  01/03/2025   DEXA SCAN  Completed   Hepatitis C Screening  Completed   HPV VACCINES  Aged Out       Assessment  This is a routine wellness examination for Zaidy Zinno.  Health Maintenance: Due or Overdue Health Maintenance Due  Topic Date Due   Zoster Vaccines- Shingrix (1 of 2) Never done   Pneumonia Vaccine 63+ Years old (2 of 2 - PPSV23 or PCV20) 11/16/2020   COVID-19 Vaccine (6 - 2023-24 season) 08/01/2022    Otis Dials does not need a referral for Community Assistance: Care Management:   not applicable Social Work:    not applicable Prescription Assistance:  not applicable Nutrition/Diabetes Education:  not  applicable   Plan:  Personalized Goals  Goals Addressed             This Visit's Progress    Patient Stated       Get back to 129 lbs       Personalized Health Maintenance & Screening Recommendations  Shingles Vaccin  Lung Cancer Screening Recommended: no (Low Dose CT Chest recommended if Age 80-80 years, 30 pack-year currently smoking OR have quit w/in past 15 years) Hepatitis C Screening recommended: no HIV Screening recommended: no  Advanced Directives: Written information was not given per the patient's request.  Referrals & Orders Orders Placed This Encounter  Procedures   CBC with Differential/Platelet   Comprehensive metabolic panel   Lipid panel   TSH   Vitamin B12   VITAMIN D 25 Hydroxy (Vit-D Deficiency, Fractures)    Follow-up Plan Follow-up with Sly Parlee, Sung Amabile, NP as planned    I have personally reviewed and noted the following in the patient's chart:   Medical and social history Use of alcohol, tobacco or illicit drugs  Current medications and supplements Functional ability and status Nutritional status Physical activity Advanced directives List of other physicians Hospitalizations, surgeries, and ER visits in previous 12 months Vitals Screenings to include cognitive, depression, and falls Referrals and appointments  In addition, I have reviewed and discussed with patient certain preventive protocols, quality metrics, and best practice recommendations. A written personalized care plan for preventive services as well as general preventive health recommendations were provided to patient.     Tollie Eth, DNP, AGNP-c   04/14/2023

## 2023-04-14 NOTE — Assessment & Plan Note (Signed)
Epipen refill provided today for allergy to bee venom. No concerns present.

## 2023-04-15 LAB — COMPREHENSIVE METABOLIC PANEL
ALT: 16 IU/L (ref 0–32)
AST: 29 IU/L (ref 0–40)
Albumin/Globulin Ratio: 1.9 (ref 1.2–2.2)
Albumin: 4.3 g/dL (ref 3.9–4.9)
Bilirubin Total: 0.3 mg/dL (ref 0.0–1.2)
Calcium: 9.4 mg/dL (ref 8.7–10.3)
Chloride: 107 mmol/L — ABNORMAL HIGH (ref 96–106)
Creatinine, Ser: 0.78 mg/dL (ref 0.57–1.00)
Globulin, Total: 2.3 g/dL (ref 1.5–4.5)
Total Protein: 6.6 g/dL (ref 6.0–8.5)
eGFR: 82 mL/min/{1.73_m2} (ref >59)

## 2023-04-15 LAB — CBC WITH DIFFERENTIAL/PLATELET
Basophils Absolute: 0 10*3/uL (ref 0.0–0.2)
EOS (ABSOLUTE): 0.1 10*3/uL (ref 0.0–0.4)
Eos: 1 %
Hematocrit: 41 % (ref 34.0–46.6)
Immature Grans (Abs): 0 10*3/uL (ref 0.0–0.1)
Lymphocytes Absolute: 2.3 10*3/uL (ref 0.7–3.1)
MCH: 31.4 pg (ref 26.6–33.0)
Monocytes Absolute: 0.3 10*3/uL (ref 0.1–0.9)
Monocytes: 5 %
Neutrophils: 56 %
RDW: 12.3 % (ref 11.7–15.4)
WBC: 6.1 10*3/uL (ref 3.4–10.8)

## 2023-04-15 LAB — LIPID PANEL
Chol/HDL Ratio: 3.1 ratio (ref 0.0–4.4)
Cholesterol, Total: 181 mg/dL (ref 100–199)
HDL: 59 mg/dL (ref >39)
LDL Chol Calc (NIH): 103 mg/dL — ABNORMAL HIGH (ref 0–99)
VLDL Cholesterol Cal: 19 mg/dL (ref 5–40)

## 2023-04-15 LAB — VITAMIN B12: Vitamin B-12: 544 pg/mL (ref 232–1245)

## 2023-04-24 ENCOUNTER — Encounter: Payer: Self-pay | Admitting: Nurse Practitioner

## 2023-04-24 DIAGNOSIS — B029 Zoster without complications: Secondary | ICD-10-CM

## 2023-04-24 MED ORDER — VALACYCLOVIR HCL 1 G PO TABS
1000.0000 mg | ORAL_TABLET | Freq: Three times a day (TID) | ORAL | 0 refills | Status: AC
Start: 2023-04-24 — End: 2023-05-01

## 2023-04-24 NOTE — Addendum Note (Signed)
Addended by: Sophiarose Eades, Huntley Dec E on: 04/24/2023 12:46 PM   Modules accepted: Orders

## 2023-04-29 ENCOUNTER — Ambulatory Visit (HOSPITAL_BASED_OUTPATIENT_CLINIC_OR_DEPARTMENT_OTHER): Payer: Medicare HMO | Admitting: Obstetrics & Gynecology

## 2023-05-06 ENCOUNTER — Encounter (HOSPITAL_BASED_OUTPATIENT_CLINIC_OR_DEPARTMENT_OTHER): Payer: Self-pay | Admitting: Obstetrics & Gynecology

## 2023-05-06 ENCOUNTER — Ambulatory Visit (INDEPENDENT_AMBULATORY_CARE_PROVIDER_SITE_OTHER): Payer: Medicare HMO | Admitting: Obstetrics & Gynecology

## 2023-05-06 VITALS — BP 128/82 | HR 64 | Ht 65.0 in | Wt 133.8 lb

## 2023-05-06 DIAGNOSIS — Z01419 Encounter for gynecological examination (general) (routine) without abnormal findings: Secondary | ICD-10-CM | POA: Diagnosis not present

## 2023-05-06 DIAGNOSIS — M818 Other osteoporosis without current pathological fracture: Secondary | ICD-10-CM

## 2023-05-06 DIAGNOSIS — C563 Malignant neoplasm of bilateral ovaries: Secondary | ICD-10-CM | POA: Diagnosis not present

## 2023-05-06 NOTE — Progress Notes (Signed)
72 y.o. G2P2 Married White or Caucasian female here for breast and pelvic exam.  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  Doing well.  No GI issues.  Saw Dr. Pricilla Holm in 01/2022.  Ca-125 was normal.  She is on Tamoxifen right now.  Reports Dr. Pricilla Holm told her she actually didn't have to be on anything but she's not ready for that right now.    Denies vaginal bleeding.  Had shingles vaccine 04/15/2023 and then had a few blisters.  [place] Dec Early prescribed valtrex.  Wants to know about completing series and when to get next Covid vaccine.  Patient's last menstrual period was 10/31/2008.          Sexually active: Yes.    H/O STD:  no  Health Maintenance: PCP:  Enid Skeens.  Last wellness appt was 04/2023.  Did blood work at that appt:  yes.  Reviewed with pt. Vaccines are up to date:  yes Colonoscopy:  01/03/2015 MMG:  09/23/2022 BMD:  11/03/2022 Last pap smear:  04/03/2021.   H/o abnormal pap smear:  no    reports that she quit smoking about 39 years ago. Her smoking use included cigarettes. She has a 14.00 pack-year smoking history. She has never used smokeless tobacco. She reports current alcohol use. She reports that she does not use drugs.  Past Medical History:  Diagnosis Date   Allergy 1964   Anemia    Bronchitis    Cancer (HCC)    Complication of anesthesia    Endometriosis    Family history of renal cancer 05/09/2021   History of anemia    Migraines    Osteoporosis    PONV (postoperative nausea and vomiting)     Past Surgical History:  Procedure Laterality Date   ABDOMINAL HYSTERECTOMY  July 02, 2021   CESAREAN SECTION  1986, 1990   and BTSP   COLONOSCOPY     DEBULKING N/A 07/02/2021   Procedure: TUMOR DEBULKING;  Surgeon: Carver Fila, MD;  Location: [facility];  Service: Gynecology;  Laterality: N/A;   FRACTURE SURGERY  Sept 2,2021   HYSTERECTOMY ABDOMINAL WITH SALPINGO-OOPHORECTOMY N/A 07/02/2021    Procedure: HYSTERECTOMY ABDOMINAL WITH BILATERAL SALPINGO-OOPHORECTOMY; OMENTECTOMY;  Surgeon: Carver Fila, MD;  Location: [facility];  Service: Gynecology;  Laterality: N/A;   LAPAROSCOPY     for infertility-endometriosis   LAPAROSCOPY     2nd for infertility   mole removed     precancerous mole right upper abd   ORIF WRIST FRACTURE Right 08/02/2020   Procedure: Right distal radius open reduction internal fixation;  Surgeon: Ernest Mallick, MD;  Location: [facility];  Service: Orthopedics;  Laterality: Right;    TONSILLECTOMY AND ADENOIDECTOMY     TUBAL LIGATION  O7/28/1989    Current Outpatient Medications  Medication Sig Dispense Refill   acetaminophen (TYLENOL) 500 MG tablet Take 500 mg by mouth every 6 (six) hours as needed for moderate pain or headache.     Boswellia-Glucosamine-Vit D (OSTEO BI-FLEX ONE PER DAY PO) Take by mouth daily.     Calcium Carbonate-Vit D-Min (CALTRATE 600+D PLUS MINERALS PO)      diclofenac Sodium (VOLTAREN) 1 % GEL Apply 4 g topically 4 (four) times daily as needed. For hip pain. 150 g 3   EPINEPHrine 0.3 mg/0.3 mL IJ SOAJ injection Inject 0.3 mg into the muscle as needed (allergic reaction). 2 each 6   ibuprofen (ADVIL)  600 MG tablet Take 1 tablet (600 mg total) by mouth every 6 (six) hours as needed for moderate pain. For AFTER surgery 30 tablet 0   magnesium 30 MG tablet Take 30 mg by mouth 2 (two) times daily.     Multiple Vitamins-Minerals (ADULT GUMMY PO) Take 2 capsules by mouth daily.     OVER THE COUNTER MEDICATION Take 100 mg by mouth daily. Natures Made Collagen Gummy     OVER THE COUNTER MEDICATION Take 500 mcg by mouth daily at 2 PM. Superior Source K-2     polyethylene glycol (MIRALAX / GLYCOLAX) 17 g packet Take 17 g by mouth daily.     senna-docusate (SENOKOT-S) 8.6-50 MG tablet Take 2 tablets by mouth at bedtime. For AFTER surgery, do not take if having diarrhea 30 tablet 0   SUMAtriptan (IMITREX) 100 MG tablet 1 tab with  headache onset.  Can repeat in 2 hours.  Max dosage 200mg /24 hours. 36 tablet 4   tamoxifen (NOLVADEX) 20 MG tablet Take 1 tablet (20 mg total) by mouth daily. 60 tablet 6   No current facility-administered medications for this visit.    Family History  Problem Relation Age of Onset   Renal cancer Father 37       Agent Orange exposure   Cancer Father    Hypertension Sister    Hypothyroidism Sister    Other Sister        Wegener's syndrome   Hypertension Brother    Diabetes Maternal Grandmother    Heart attack Paternal Grandfather    Osteoporosis Other    Colon cancer Neg Hx    Breast cancer Neg Hx    Uterine cancer Neg Hx    Ovarian cancer Neg Hx    Endometrial cancer Neg Hx    Prostate cancer Neg Hx    Pancreatic cancer Neg Hx     Review of Systems  Constitutional: Negative.   Genitourinary: Negative.     Exam:   BP (!) 125/91 (BP Location: Right Arm, Patient Position: Sitting, Cuff Size: Normal)   Pulse 64   Ht 5\' 5"  (1.651 m) Comment: Reported  Wt 133 lb 12.8 oz (60.7 kg)   LMP 10/31/2008   BMI 22.27 kg/m   Height: 5\' 5"  (165.1 cm) (Reported)  General appearance: alert, cooperative and appears stated age Breasts: normal appearance, no masses or tenderness Abdomen: soft, non-tender; bowel sounds normal; no masses,  no organomegaly Lymph nodes: Cervical, supraclavicular, and axillary nodes normal.  No abnormal inguinal nodes palpated Neurologic: Grossly normal  Pelvic: External genitalia:  no lesions              Urethra:  normal appearing urethra with no masses, tenderness or lesions              Bartholins and Skenes: normal                 Vagina: normal appearing vagina with atrophic changes and no discharge, no lesions              Cervix: absent              Pap taken: No. Bimanual Exam:  Uterus:  uterus absent              Adnexa: no mass, fullness, tenderness               Rectovaginal: Confirms               Anus:  normal sphincter tone, no  lesions  Chaperone, Ina Homes, CMA, was present for exam.  Assessment/Plan: 1. Encntr for gyn exam (general) (routine) w/o abn findings - Pap smear not indicated - Mammogram 08/2022 - Colonoscopy 2016, f/u 10 years - Bone mineral density 2023 - lab work done with PCP,  [place] Dec Early - vaccines reviewed/updated.  We discussed shingrix vaccination and waiting about a year to complete the series.   2. Malignant neoplasm of both ovaries (HCC) - being seen every three months with Dr. Pricilla Holm.  Having ca-125 levels other levels done.   - she did have negative genetic tesitng  3. Other osteoporosis without current pathological fracture - on tamoxifen for now.  Will repeat in 2 years.  May need to consider medication.

## 2023-05-16 ENCOUNTER — Encounter (HOSPITAL_BASED_OUTPATIENT_CLINIC_OR_DEPARTMENT_OTHER): Payer: Self-pay | Admitting: Obstetrics & Gynecology

## 2023-05-26 ENCOUNTER — Encounter: Payer: Self-pay | Admitting: Gynecologic Oncology

## 2023-05-28 ENCOUNTER — Inpatient Hospital Stay: Payer: Medicare HMO | Attending: Gynecologic Oncology | Admitting: Gynecologic Oncology

## 2023-05-28 ENCOUNTER — Encounter: Payer: Self-pay | Admitting: Gynecologic Oncology

## 2023-05-28 ENCOUNTER — Other Ambulatory Visit: Payer: Self-pay

## 2023-05-28 ENCOUNTER — Inpatient Hospital Stay: Payer: Medicare HMO | Admitting: Gynecologic Oncology

## 2023-05-28 VITALS — BP 144/86 | HR 63 | Ht 64.96 in | Wt 133.8 lb

## 2023-05-28 DIAGNOSIS — Z9221 Personal history of antineoplastic chemotherapy: Secondary | ICD-10-CM | POA: Insufficient documentation

## 2023-05-28 DIAGNOSIS — C563 Malignant neoplasm of bilateral ovaries: Secondary | ICD-10-CM

## 2023-05-28 DIAGNOSIS — C569 Malignant neoplasm of unspecified ovary: Secondary | ICD-10-CM

## 2023-05-28 DIAGNOSIS — Z90710 Acquired absence of both cervix and uterus: Secondary | ICD-10-CM | POA: Insufficient documentation

## 2023-05-28 DIAGNOSIS — Z79810 Long term (current) use of selective estrogen receptor modulators (SERMs): Secondary | ICD-10-CM | POA: Diagnosis not present

## 2023-05-28 DIAGNOSIS — Z90722 Acquired absence of ovaries, bilateral: Secondary | ICD-10-CM | POA: Diagnosis not present

## 2023-05-28 NOTE — Progress Notes (Signed)
Gynecologic Oncology Return Clinic Visit  05/28/23  Reason for Visit: surveillance   Treatment History: Oncology History Overview Note  Borderline serous carcinoma   Ovarian cancer (HCC)  04/01/2021 Initial Diagnosis   She notes that she noticed an umbilical hernia.  She was scheduled to see a surgeon about possible repair.  She also endorses intermittent pain in around the area of her right hip that she thought was secondary to her exercise routine.  Over the last week, she has had a decreased appetite.  She denies any nausea or emesis but endorses several months of abdominal bloating and early satiety.  She notes normal bowel function, which she describes as bowel movements at least daily.  She has had some increased urinary frequency and around [place] developed stress urinary incontinence when she runs.     04/04/2021 Imaging   US pelvis  Septate uterus, otherwise unremarkable.   Normal endometrial complex.   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   Recommend further assessment by CT imaging with IV and oral contrast.   04/05/2021 Tumor Marker   Patient's tumor was tested for the following markers: CA-125 Results of the tumor marker test revealed 826.   04/05/2021 Imaging   1. Large bilateral solid-appearing adnexal masses are identified concerning for ovarian neoplasm. There is soft tissue infiltration into the surrounding peritoneal cavity which partially encases nonobstructed loops of small bowel with probable serosal involvement. 2. Moderate volume of ascites identified within the abdomen and pelvis likely secondary to peritoneal carcinomatosis. Diagnostic paracentesis may be helpful for further workup. 3. Mild increased caliber of small bowel loops with a few air-fluid levels. However, there are no signs to suggest a bowel obstruction as enteric contrast material is noted up to  the level of the hepatic flexure.   04/10/2021 Initial Diagnosis   Ovarian cancer (HCC)   04/10/2021 Procedure   Successful ultrasound-guided paracentesis yielding 1.4 liters of peritoneal fluid.   04/11/2021 Cancer Staging   Staging form: Ovary, Fallopian Tube, and Primary Peritoneal Carcinoma, AJCC 8th Edition - Clinical stage from 04/11/2021: Stage IIIC (cT3c, cN0, cM0) - Signed by Artis Delay, MD on 04/11/2021 Stage prefix: Initial diagnosis   04/18/2021 Tumor Marker   Patient's tumor was tested for the following markers: CA-125. Results of the tumor marker test revealed 771   04/22/2021 - 06/05/2021 Chemotherapy         05/14/2021 Tumor Marker   Patient's tumor was tested for the following markers: CA-125. Results of the tumor marker test revealed 877.   06/10/2021 Imaging   1. Today's study demonstrates a centrally stable disease when compared to prior examination from 04/05/2021, with large bilateral ovarian lesions and moderate volume of malignant ascites, as detailed above. 2. Trace right pleural effusion lying dependently.     07/02/2021 Surgery     Preoperative Diagnosis: Malignancy presumed to be of gyn origin based on IHC, lack of clinical response to NACT   Postoperative Diagnosis: Stage IIIC presumed ovarian cancer    Procedure(s) Performed: Exploratory laparotomy with total hysterectomy bilateral salpingo-oophorectomy, total abdominal hysterectomy, tumor debulking including omentectomy and excision of several tumor plaques versus treated disease, oversew of bladder peritoneum   Surgeon: Carin Hock, MD    Specimens: Uterus Cervix, Bilateral tubes / ovaries, peritoneal plaques (sent with uterus and cervix), omentum   Estimated Blood Loss: 250 mL.     Ascites: approximately 2L upon entry and produced during surgery  Operative Findings: Small mobile uterus, nodular masses filling the cul de sac. On intra-abdominal entry, approximately 1.6L of green-tinged ascites was  encountered. Normal liver edge, diaphragm, and stomach. Infracolic omentum with a 6x4cm nodular area and several smaller (1-3cm) deposits c/w tumor. Otherwise, omentum without evidence of disease. Small fibrinous debris on the small bowel, no obvious tumor implants. Mesentery free of disease. No appreciable adenopathy. Bilateral ovaries replaced with vesicular tumors (L>R), measuring approximately 8cm and 12cm. Several areas of plaque (appeared more consistent with treated tumor than active cancer - right pelvic sidewall, posterior cul de sac, and anterior cul de sac) noted, most excised. Uterus 4-6cm in size. Dense adhesions of the bladder to the LUS and cervix, likely combination of prior c-section and treatment effect. R0 resection at the end of surgery if peritoneal plaques not c/w tumor. No clear umbilical hernia.    07/02/2021 Pathology Results   A. 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   - Surface involvement present.  - Fallopian tube involvement present, non-invasive.   B. OVARY AND FALLOPIAN TUBE, LEFT, SALPINGO OOPHORECTOMY:  - Serous borderline tumor.  - Surface involvement present, non-invasive.  - Fallopian tube involvement present, non-invasive.   C. UTERUS AND CERVIX, HYSTERECTOMY:  - Uterus:       Endometrium: Benign endometrial type polyp. Inactive endometrium.  No hyperplasia or malignancy.       Myometrium: Unremarkable. No malignancy.       Serosa: Invasive serous implant.  - Cervix: Benign squamous and endocervical mucosa. No dysplasia or  malignancy.  - Bilateral fallopian tubes: Surface involvement present.   D. OMENTUM:  - Non-invasive serous implant.  - One of one lymph nodes negative for carcinoma (0/1).   ONCOLOGY TABLE:   OVARY or FALLOPIAN TUBE or PRIMARY PERITONEUM: Resection   Procedure: Hysterectomy with bilateral salpingo-oophorectomy and omental  resection.   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  Other Tissue/ Organ Involvement: Not applicable  Largest Extrapelvic Peritoneal Focus: 2 mm  Peritoneal/Ascitic Fluid Involvement: Not applicable  Chemotherapy Response Score (CRS): Cannot be determined  Regional Lymph Nodes:       Number of Nodes with Metastasis Greater than 10 mm: 0       Number of Nodes with Metastasis 10 mm or Less (excludes isolated  tumor cells): 0       Number of Nodes with Isolated Tumor Cells (0.2 mm or less): 0       Number of Lymph Nodes Examined: 1  Distant Metastasis:       Distant Site(s) Involved: Not applicable  Pathologic Stage Classification (pTNM, AJCC 8th Edition): ypT3a, ypN0  Ancillary Studies: Can be performed upon request  Representative Tumor Block: A7  Comment(s): Given extensive involvement and neoadjuvant therapy it is  difficult to determine the exact primary location, but the right ovary  is slightly favored. Dr. Luisa Hart has reviewed the case.    07/02/2021 Pathology Results   FINAL MICROSCOPIC DIAGNOSIS:   A. 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   -  Surface involvement present.  - Fallopian tube involvement present, non-invasive.   B. OVARY AND FALLOPIAN TUBE, LEFT, SALPINGO OOPHORECTOMY:  - Serous borderline tumor.  - Surface involvement present, non-invasive.  - Fallopian tube involvement present, non-invasive.   C. UTERUS AND CERVIX, HYSTERECTOMY:  - Uterus:       Endometrium:  Benign endometrial type polyp. Inactive endometrium.  No hyperplasia or malignancy.       Myometrium: Unremarkable. No malignancy.       Serosa: Invasive serous implant.  - Cervix: Benign squamous and endocervical mucosa. No dysplasia or malignancy.  - Bilateral fallopian tubes: Surface involvement present.   D. OMENTUM:  - Non-invasive serous implant.  - One of one lymph nodes negative for carcinoma (0/1).   ONCOLOGY TABLE:   OVARY or FALLOPIAN TUBE or PRIMARY PERITONEUM: Resection   Procedure: Hysterectomy with bilateral salpingo-oophorectomy and omental resection.  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  Other Tissue/ Organ Involvement: Not applicable  Largest Extrapelvic Peritoneal Focus: 2 mm  Peritoneal/Ascitic Fluid Involvement: Not applicable  Chemotherapy Response Score (CRS): Cannot be determined  Regional Lymph Nodes:       Number of Nodes with Metastasis Greater than 10 mm: 0       Number of Nodes with Metastasis 10 mm or Less (excludes isolated  tumor cells): 0       Number of Nodes with Isolated Tumor Cells (0.2 mm or less): 0       Number of Lymph Nodes Examined: 1  Distant Metastasis:       Distant Site(s) Involved: Not applicable  Pathologic Stage Classification (pTNM, AJCC 8th Edition): ypT3a, ypN0  Ancillary Studies: Can be performed upon request  Representative Tumor Block: A7  Comment(s): Given extensive involvement and neoadjuvant therapy it is difficult to determine the exact primary location, but the right ovary is slightly favored. Dr. Saralyn Pilar  has reviewed the case.    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     Ascites: approximately 2L upon entry and produced during surgery Operative Findings: Small mobile uterus, nodular masses filling the cul de sac. On intra-abdominal entry, approximately 1.6L of green-tinged ascites was encountered. Normal liver edge, diaphragm, and stomach. Infracolic omentum with a [id number] nodular area and several smaller (1-3cm) deposits c/w tumor. Otherwise, omentum without evidence of disease. Small fibrinous debris on the small bowel, no obvious tumor implants. Mesentery free of disease. No appreciable adenopathy. Bilateral ovaries replaced with vesicular tumors (L>R), measuring approximately 8cm and 12cm. Several areas of plaque (appeared more consistent with treated tumor than active cancer - right pelvic sidewall, posterior cul de sac, and anterior cul de sac) noted, most  excised. Uterus 4-6cm in size. Dense adhesions of the bladder to the LUS and cervix, likely combination of prior c-section and treatment effect. R0 resection at the end of surgery if peritoneal plaques not c/w tumor. No clear umbilical hernia.    07/17/2021 Tumor Marker   Patient's tumor was tested for the following markers: CA-125. Results of the tumor marker test revealed 116.   08/06/2021 Genetic Testing   Negative genetic testing:  no pathogenic variants detected in [facility] TumorNext-HRD + CancerNext Panel.  The report date is August 06, 2021.    The CancerNext gene panel offered by [employer] includes sequencing and rearrangement analysis for the following 37 genes:   APC, ATM, AXIN2 BARD1, BMPR1A, BRCA1, BRCA2, BRIP1, CDH1, CDK4, CDKN2A, CHEK2, DICER1, HOXB13, EPCAM, GREM1, MLH1, MRE11A, MSH2, MSH3, MSH6, MUTYH, NBN, NF1, PALB2, PMS2, POLD1, POLE, PTEN, RAD50, RAD51C, RAD51D, RECQL, SMAD4, SMARCA4, STK11, and TP53. TumorNext-HRD is a paired tumor and germline analysis of BRCA1 and BRCA2 plus 9 additional genes in the homologous recombination repair pathway (ATM, BARD1, BRCA1, BRCA2, BRIP1, CHEK2, MRE11A, NBN, PALB2, RAD51C, RAD51D).     Interval History: Doing well.  Endorses normal bowel bladder function.  Had 1 episode of right lower quadrant pain, otherwise denies any abdominal or pelvic pain.  Recently had a tick bite.  Continues to do well on tamoxifen without significant side effects.  Denies any vaginal bleeding.  She is looking forward to a road trip with her husband where they will be visiting [place], [place], and [place].  Past Medical/Surgical History: Past Medical History:  Diagnosis Date   Allergy 1964   Anemia    Bronchitis    Cancer (HCC)    Complication of anesthesia    Endometriosis    Family history of renal cancer 05/09/2021   History of anemia    Migraines    Osteoporosis    PONV (postoperative nausea and vomiting)     Past Surgical History:  Procedure Laterality Date   CESAREAN SECTION  1986, 1990   and BTSP   COLONOSCOPY     DEBULKING N/A 07/02/2021   Procedure: TUMOR DEBULKING;  Surgeon: Carver Fila, MD;  Location: [facility];  Service: Gynecology;  Laterality: N/A;   FRACTURE SURGERY  Sept 2,2021   HYSTERECTOMY ABDOMINAL WITH SALPINGO-OOPHORECTOMY N/A 07/02/2021   Procedure: HYSTERECTOMY ABDOMINAL WITH BILATERAL SALPINGO-OOPHORECTOMY; OMENTECTOMY;  Surgeon: Carver Fila, MD;  Location: [facility];  Service: Gynecology;  Laterality: N/A;   LAPAROSCOPY     for infertility-endometriosis   LAPAROSCOPY     2nd for infertility   mole removed     precancerous mole right upper abd   ORIF WRIST FRACTURE Right 08/02/2020   Procedure: Right distal radius open reduction internal fixation;  Surgeon: Ernest Mallick, MD;  Location: [facility];  Service: Orthopedics;  Laterality: Right;    TONSILLECTOMY AND ADENOIDECTOMY     TUBAL LIGATION  O7/28/1989    Family History  Problem Relation Age of Onset   Renal cancer Father 49       Agent Orange exposure   Cancer Father    Hypertension Sister    Hypothyroidism Sister    Other Sister        Wegener's syndrome   Hypertension Brother    Diabetes Maternal Grandmother    Heart attack Paternal Grandfather    Osteoporosis Other    Colon cancer Neg Hx    Breast cancer Neg Hx    Uterine cancer Neg Hx  Ovarian cancer Neg Hx    Endometrial cancer Neg Hx    Prostate cancer Neg Hx    Pancreatic cancer Neg Hx     Social History   Socioeconomic History   Marital status: Married    Spouse name: Phil   Number of children: 2   Years of education: Not on file   Highest education level: Not on file  Occupational History   Occupation: retired  Tobacco Use   Smoking status: Former    Packs/day: 1.00    Years: 14.00    Additional pack years: 0.00    Total pack years: 14.00    Types: Cigarettes    Quit date: 12/02/1983    Years since quitting: 39.5   Smokeless tobacco: Never   Tobacco comments:    I have not smoked in 37 years.  Vaping Use   Vaping Use: Never used  Substance and Sexual Activity   Alcohol use: Yes    Comment: Maybe 2 a month.   Drug use: No   Sexual activity: Yes    Partners: Male    Birth control/protection: Post-menopausal, Surgical    Comment: BTL  Other Topics Concern   Not on file  Social History Narrative   Not on file   Social Determinants of Health   Financial  Resource Strain: Not on file  Food Insecurity: Not on file  Transportation Needs: No Transportation Needs (08/12/2021)   PRAPARE - Administrator, Civil Service (Medical): No    Lack of Transportation (Non-Medical): No  Physical Activity: Sufficiently Active (08/12/2021)   Exercise Vital Sign    Days of Exercise per Week: 4 days    Minutes of Exercise per Session: 60 min  Stress: No Stress Concern Present (08/12/2021)   [employer] of Occupational Health - Occupational Stress Questionnaire    Feeling of Stress : Not at all  Social Connections: Socially Integrated (08/12/2021)   Social Connection and Isolation Panel [NHANES]    Frequency of Communication with Friends and Family: More than three times a week    Frequency of Social Gatherings with Friends and Family: More than three times a week    Attends Religious Services: More than 4 times per year    Active Member of [employer] or Organizations: Yes    Attends Engineer, structural: More than 4 times per year    Marital Status: Married    Current Medications:  Current Outpatient Medications:    acetaminophen (TYLENOL) 500 MG tablet, Take 500 mg by mouth every 6 (six) hours as needed for moderate pain or headache., Disp: , Rfl:    Boswellia-Glucosamine-Vit D (OSTEO BI-FLEX ONE PER DAY PO), Take by mouth daily., Disp: , Rfl:    Calcium Carbonate-Vit D-Min (CALTRATE 600+D PLUS MINERALS PO), , Disp: , Rfl:    EPINEPHrine 0.3 mg/0.3 mL IJ SOAJ injection, Inject 0.3 mg into the muscle as needed (allergic reaction)., Disp: 2 each, Rfl: 6   ibuprofen (ADVIL) 600 MG tablet, Take 1 tablet (600 mg total) by mouth every 6 (six) hours as needed for moderate pain. For AFTER surgery, Disp: 30 tablet, Rfl: 0   magnesium 30 MG tablet, Take 30 mg by mouth 2 (two) times daily., Disp: , Rfl:    Multiple Vitamins-Minerals (ADULT GUMMY PO), Take 2 capsules by mouth daily., Disp: , Rfl:    OVER THE COUNTER MEDICATION, Take 100 mg by mouth  daily. Natures Made Collagen Gummy, Disp: , Rfl:    OVER THE  COUNTER MEDICATION, Take 500 mcg by mouth daily at 2 PM. Superior Source K-2, Disp: , Rfl:    polyethylene glycol (MIRALAX / GLYCOLAX) 17 g packet, Take 17 g by mouth daily., Disp: , Rfl:    senna-docusate (SENOKOT-S) 8.6-50 MG tablet, Take 2 tablets by mouth at bedtime. For AFTER surgery, do not take if having diarrhea, Disp: 30 tablet, Rfl: 0   SUMAtriptan (IMITREX) 100 MG tablet, 1 tab with headache onset.  Can repeat in 2 hours.  Max dosage 200mg /24 hours., Disp: 36 tablet, Rfl: 4   tamoxifen (NOLVADEX) 20 MG tablet, Take 1 tablet (20 mg total) by mouth daily., Disp: 60 tablet, Rfl: 6   diclofenac Sodium (VOLTAREN) 1 % GEL, Apply 4 g topically 4 (four) times daily as needed. For hip pain. (Patient not taking: Reported on 05/26/2023), Disp: 150 g, Rfl: 3  Review of Systems: Denies appetite changes, fevers, chills, fatigue, unexplained weight changes. Denies hearing loss, neck lumps or masses, mouth sores, ringing in ears or voice changes. Denies cough or wheezing.  Denies shortness of breath. Denies chest pain or palpitations. Denies leg swelling. Denies abdominal distention, pain, blood in stools, constipation, diarrhea, nausea, vomiting, or early satiety. Denies pain with intercourse, dysuria, frequency, hematuria or incontinence. Denies hot flashes, pelvic pain, vaginal bleeding or vaginal discharge.   Denies joint pain, back pain or muscle pain/cramps. Denies itching, rash, or wounds. Denies dizziness, headaches, numbness or seizures. Denies swollen lymph nodes or glands, denies easy bruising or bleeding. Denies anxiety, depression, confusion, or decreased concentration.  Physical Exam: BP (!) 144/86 (BP Location: Left Arm, Patient Position: Sitting)   Pulse 63   Ht 5' 4.96" (1.65 m)   Wt 133 lb 12.8 oz (60.7 kg)   LMP 10/31/2008   SpO2 100%   BMI 22.29 kg/m  General: Alert, oriented, no acute distress. HEENT:  Normocephalic, atraumatic, sclera anicteric. Chest: Unlabored breathing on room air.  Lungs are clear to auscultation bilaterally, no wheezes or rhonchi. Cardiovascular: Regular rate and rhythm, no murmurs, rubs, or gallops appreciated. Abdomen: soft, nontender.  Normoactive bowel sounds.  No masses or hepatosplenomegaly appreciated.  Well-healed scar. Extremities: Grossly normal range of motion.  Warm, well perfused.  No edema bilaterally. Skin: No rashes or lesions noted. Lymphatics: No cervical, supraclavicular, or inguinal adenopathy. GU: Normal appearing external genitalia without erythema, excoriation, or lesions.  Speculum exam reveals mildly atrophic vaginal mucosa, no lesions or masses, minimal physiologic discharge.  Bimanual exam reveals cuff intact, no nodularity or masses.  Rectovaginal exam confirms these findings.  Laboratory & Radiologic Studies: Component Ref Range & Units 3 mo ago (02/20/23) 7 mo ago (10/28/22) 10 mo ago (07/21/22) 1 yr ago (04/25/22) 1 yr ago (01/27/22) 1 yr ago (10/14/21) 1 yr ago (07/17/21)  Cancer Antigen (CA) 125 0.0 - 38.1 U/mL 11.8 13.9 CM 10.5 CM 10.7 CM 9.9 CM 11.9 CM 116.0 High  CM   Assessment & Plan: Dejana Jacquiline Zurcher is a 72 y.o. 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 Surgery in 07/2021. Genetic testing shows no somatic or germline mutations. On Tamoxifen maintenance.   Patient continues to do well and is NED on exam.  CA-125 will be drawn today released to her MyChart tomorrow.   She takes calcium and vitamin D supplementation.  Based on last bone scan at the end of the 2023, we  elected to stop letrozole and start tamoxifen. She is doing very well on Tamoxifen.   The patient will return for her next surveillance visit in 3 months.  We reviewed signs and symptoms that should prompt a phone  call to be seen sooner than that visit.  20 minutes of total time was spent for this patient encounter, including preparation, face-to-face counseling with the patient and coordination of care, and documentation of the encounter.  Eugene Garnet, MD  Division of Gynecologic Oncology  Department of Obstetrics and Gynecology  [facility]

## 2023-05-28 NOTE — Patient Instructions (Signed)
It was good to see you today.  I do not see or feel any evidence of cancer recurrence on your exam.  I will see you for follow-up in 3 months.  As always, if you develop any new and concerning symptoms before your next visit, please call to see me sooner.  

## 2023-05-30 LAB — CA 125: Cancer Antigen (CA) 125: 10.6 U/mL (ref 0.0–38.1)

## 2023-08-28 ENCOUNTER — Inpatient Hospital Stay: Payer: Medicare HMO | Attending: Gynecologic Oncology

## 2023-08-28 ENCOUNTER — Encounter: Payer: Self-pay | Admitting: Gynecologic Oncology

## 2023-08-28 ENCOUNTER — Inpatient Hospital Stay: Payer: Medicare HMO | Admitting: Gynecologic Oncology

## 2023-08-28 VITALS — BP 139/80 | HR 64 | Temp 97.8°F | Resp 20 | Wt 135.2 lb

## 2023-08-28 DIAGNOSIS — C569 Malignant neoplasm of unspecified ovary: Secondary | ICD-10-CM

## 2023-08-28 DIAGNOSIS — Z87891 Personal history of nicotine dependence: Secondary | ICD-10-CM | POA: Insufficient documentation

## 2023-08-28 DIAGNOSIS — Z9221 Personal history of antineoplastic chemotherapy: Secondary | ICD-10-CM | POA: Diagnosis not present

## 2023-08-28 DIAGNOSIS — Z79810 Long term (current) use of selective estrogen receptor modulators (SERMs): Secondary | ICD-10-CM

## 2023-08-28 DIAGNOSIS — Z8059 Family history of malignant neoplasm of other urinary tract organ: Secondary | ICD-10-CM | POA: Insufficient documentation

## 2023-08-28 DIAGNOSIS — C561 Malignant neoplasm of right ovary: Secondary | ICD-10-CM

## 2023-08-28 NOTE — Patient Instructions (Signed)
It was good to see you today.  I do not see or feel any evidence of cancer recurrence on your exam.  I will see you for follow-up in 4 months.  As always, if you develop any new and concerning symptoms before your next visit, please call to see me sooner.  

## 2023-08-28 NOTE — Progress Notes (Signed)
Gynecologic Oncology Return Clinic Visit  08/28/23  Reason for Visit: surveillance   Treatment History: Oncology History Overview Note  Borderline serous carcinoma   Ovarian cancer (HCC)  04/01/2021 Initial Diagnosis   She notes that she noticed an umbilical hernia.  She was scheduled to see a surgeon about possible repair.  She also endorses intermittent pain in around the area of her right hip that she thought was secondary to her exercise routine.  Over the last week, she has had a decreased appetite.  She denies any nausea or emesis but endorses several months of abdominal bloating and early satiety.  She notes normal bowel function, which she describes as bowel movements at least daily.  She has had some increased urinary frequency and around [place] developed stress urinary incontinence when she runs.     04/04/2021 Imaging   US pelvis  Septate uterus, otherwise unremarkable.   Normal endometrial complex.   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   Recommend further assessment by CT imaging with IV and oral contrast.   04/05/2021 Tumor Marker   Patient's tumor was tested for the following markers: CA-125 Results of the tumor marker test revealed 826.   04/05/2021 Imaging   1. Large bilateral solid-appearing adnexal masses are identified concerning for ovarian neoplasm. There is soft tissue infiltration into the surrounding peritoneal cavity which partially encases nonobstructed loops of small bowel with probable serosal involvement. 2. Moderate volume of ascites identified within the abdomen and pelvis likely secondary to peritoneal carcinomatosis. Diagnostic paracentesis may be helpful for further workup. 3. Mild increased caliber of small bowel loops with a few air-fluid levels. However, there are no signs to suggest a bowel obstruction as enteric contrast material is noted up to  the level of the hepatic flexure.   04/10/2021 Initial Diagnosis   Ovarian cancer (HCC)   04/10/2021 Procedure   Successful ultrasound-guided paracentesis yielding 1.4 liters of peritoneal fluid.   04/11/2021 Cancer Staging   Staging form: Ovary, Fallopian Tube, and Primary Peritoneal Carcinoma, AJCC 8th Edition - Clinical stage from 04/11/2021: Stage IIIC (cT3c, cN0, cM0) - Signed by Artis Delay, MD on 04/11/2021 Stage prefix: Initial diagnosis   04/18/2021 Tumor Marker   Patient's tumor was tested for the following markers: CA-125. Results of the tumor marker test revealed 771   04/22/2021 - 06/05/2021 Chemotherapy         05/14/2021 Tumor Marker   Patient's tumor was tested for the following markers: CA-125. Results of the tumor marker test revealed 877.   06/10/2021 Imaging   1. Today's study demonstrates a centrally stable disease when compared to prior examination from 04/05/2021, with large bilateral ovarian lesions and moderate volume of malignant ascites, as detailed above. 2. Trace right pleural effusion lying dependently.     07/02/2021 Surgery     Preoperative Diagnosis: Malignancy presumed to be of gyn origin based on IHC, lack of clinical response to NACT   Postoperative Diagnosis: Stage IIIC presumed ovarian cancer    Procedure(s) Performed: Exploratory laparotomy with total hysterectomy bilateral salpingo-oophorectomy, total abdominal hysterectomy, tumor debulking including omentectomy and excision of several tumor plaques versus treated disease, oversew of bladder peritoneum   Surgeon: Carin Hock, MD    Specimens: Uterus Cervix, Bilateral tubes / ovaries, peritoneal plaques (sent with uterus and cervix), omentum   Estimated Blood Loss: 250 mL.     Ascites: approximately 2L upon entry and produced during surgery  Operative Findings: Small mobile uterus, nodular masses filling the cul de sac. On intra-abdominal entry, approximately 1.6L of green-tinged ascites was  encountered. Normal liver edge, diaphragm, and stomach. Infracolic omentum with a 6x4cm nodular area and several smaller (1-3cm) deposits c/w tumor. Otherwise, omentum without evidence of disease. Small fibrinous debris on the small bowel, no obvious tumor implants. Mesentery free of disease. No appreciable adenopathy. Bilateral ovaries replaced with vesicular tumors (L>R), measuring approximately 8cm and 12cm. Several areas of plaque (appeared more consistent with treated tumor than active cancer - right pelvic sidewall, posterior cul de sac, and anterior cul de sac) noted, most excised. Uterus 4-6cm in size. Dense adhesions of the bladder to the LUS and cervix, likely combination of prior c-section and treatment effect. R0 resection at the end of surgery if peritoneal plaques not c/w tumor. No clear umbilical hernia.    07/02/2021 Pathology Results   A. 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   - Surface involvement present.  - Fallopian tube involvement present, non-invasive.   B. OVARY AND FALLOPIAN TUBE, LEFT, SALPINGO OOPHORECTOMY:  - Serous borderline tumor.  - Surface involvement present, non-invasive.  - Fallopian tube involvement present, non-invasive.   C. UTERUS AND CERVIX, HYSTERECTOMY:  - Uterus:       Endometrium: Benign endometrial type polyp. Inactive endometrium.  No hyperplasia or malignancy.       Myometrium: Unremarkable. No malignancy.       Serosa: Invasive serous implant.  - Cervix: Benign squamous and endocervical mucosa. No dysplasia or  malignancy.  - Bilateral fallopian tubes: Surface involvement present.   D. OMENTUM:  - Non-invasive serous implant.  - One of one lymph nodes negative for carcinoma (0/1).   ONCOLOGY TABLE:   OVARY or FALLOPIAN TUBE or PRIMARY PERITONEUM: Resection   Procedure: Hysterectomy with bilateral salpingo-oophorectomy and omental  resection.   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  Other Tissue/ Organ Involvement: Not applicable  Largest Extrapelvic Peritoneal Focus: 2 mm  Peritoneal/Ascitic Fluid Involvement: Not applicable  Chemotherapy Response Score (CRS): Cannot be determined  Regional Lymph Nodes:       Number of Nodes with Metastasis Greater than 10 mm: 0       Number of Nodes with Metastasis 10 mm or Less (excludes isolated  tumor cells): 0       Number of Nodes with Isolated Tumor Cells (0.2 mm or less): 0       Number of Lymph Nodes Examined: 1  Distant Metastasis:       Distant Site(s) Involved: Not applicable  Pathologic Stage Classification (pTNM, AJCC 8th Edition): ypT3a, ypN0  Ancillary Studies: Can be performed upon request  Representative Tumor Block: A7  Comment(s): Given extensive involvement and neoadjuvant therapy it is  difficult to determine the exact primary location, but the right ovary  is slightly favored. Dr. Luisa Hart has reviewed the case.    07/02/2021 Pathology Results   FINAL MICROSCOPIC DIAGNOSIS:   A. 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   -  Surface involvement present.  - Fallopian tube involvement present, non-invasive.   B. OVARY AND FALLOPIAN TUBE, LEFT, SALPINGO OOPHORECTOMY:  - Serous borderline tumor.  - Surface involvement present, non-invasive.  - Fallopian tube involvement present, non-invasive.   C. UTERUS AND CERVIX, HYSTERECTOMY:  - Uterus:       Endometrium:  Benign endometrial type polyp. Inactive endometrium.  No hyperplasia or malignancy.       Myometrium: Unremarkable. No malignancy.       Serosa: Invasive serous implant.  - Cervix: Benign squamous and endocervical mucosa. No dysplasia or malignancy.  - Bilateral fallopian tubes: Surface involvement present.   D. OMENTUM:  - Non-invasive serous implant.  - One of one lymph nodes negative for carcinoma (0/1).   ONCOLOGY TABLE:   OVARY or FALLOPIAN TUBE or PRIMARY PERITONEUM: Resection   Procedure: Hysterectomy with bilateral salpingo-oophorectomy and omental resection.  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  Other Tissue/ Organ Involvement: Not applicable  Largest Extrapelvic Peritoneal Focus: 2 mm  Peritoneal/Ascitic Fluid Involvement: Not applicable  Chemotherapy Response Score (CRS): Cannot be determined  Regional Lymph Nodes:       Number of Nodes with Metastasis Greater than 10 mm: 0       Number of Nodes with Metastasis 10 mm or Less (excludes isolated  tumor cells): 0       Number of Nodes with Isolated Tumor Cells (0.2 mm or less): 0       Number of Lymph Nodes Examined: 1  Distant Metastasis:       Distant Site(s) Involved: Not applicable  Pathologic Stage Classification (pTNM, AJCC 8th Edition): ypT3a, ypN0  Ancillary Studies: Can be performed upon request  Representative Tumor Block: A7  Comment(s): Given extensive involvement and neoadjuvant therapy it is difficult to determine the exact primary location, but the right ovary is slightly favored. Dr. Luisa Hart  has reviewed the case.    07/02/2021 Surgery   Date of Service: 07/02/21   Preoperative Diagnosis: Malignancy presumed to be of gyn origin based on IHC, lack of clinical response to NACT   Postoperative Diagnosis: Stage IIIC presumed ovarian cancer    Procedure(s) Performed: Exploratory laparotomy with total hysterectomy bilateral salpingo-oophorectomy, total abdominal hysterectomy, tumor debulking including omentectomy and excision of several tumor plaques versus treated disease, oversew of bladder peritoneum   Surgeon: Carin Hock, MD Specimens: Uterus Cervix, Bilateral tubes / ovaries, peritoneal plaques (sent with uterus and cervix), omentum   Estimated Blood Loss: 250 mL.     Ascites: approximately 2L upon entry and produced during surgery Operative Findings: Small mobile uterus, nodular masses filling the cul de sac. On intra-abdominal entry, approximately 1.6L of green-tinged ascites was encountered. Normal liver edge, diaphragm, and stomach. Infracolic omentum with a 6x4cm nodular area and several smaller (1-3cm) deposits c/w tumor. Otherwise, omentum without evidence of disease. Small fibrinous debris on the small bowel, no obvious tumor implants. Mesentery free of disease. No appreciable adenopathy. Bilateral ovaries replaced with vesicular tumors (L>R), measuring approximately 8cm and 12cm. Several areas of plaque (appeared more consistent with treated tumor than active cancer - right pelvic sidewall, posterior cul de sac, and anterior cul de sac) noted, most  excised. Uterus 4-6cm in size. Dense adhesions of the bladder to the LUS and cervix, likely combination of prior c-section and treatment effect. R0 resection at the end of surgery if peritoneal plaques not c/w tumor. No clear umbilical hernia.    07/17/2021 Tumor Marker   Patient's tumor was tested for the following markers: CA-125. Results of the tumor marker test revealed 116.   08/06/2021 Genetic Testing   Negative genetic testing:  no pathogenic variants detected in [facility] TumorNext-HRD + CancerNext Panel.  The report date is August 06, 2021.    The CancerNext gene panel offered by [employer] includes sequencing and rearrangement analysis for the following 37 genes:   APC, ATM, AXIN2 BARD1, BMPR1A, BRCA1, BRCA2, BRIP1, CDH1, CDK4, CDKN2A, CHEK2, DICER1, HOXB13, EPCAM, GREM1, MLH1, MRE11A, MSH2, MSH3, MSH6, MUTYH, NBN, NF1, PALB2, PMS2, POLD1, POLE, PTEN, RAD50, RAD51C, RAD51D, RECQL, SMAD4, SMARCA4, STK11, and TP53. TumorNext-HRD is a paired tumor and germline analysis of BRCA1 and BRCA2 plus 9 additional genes in the homologous recombination repair pathway (ATM, BARD1, BRCA1, BRCA2, BRIP1, CHEK2, MRE11A, NBN, PALB2, RAD51C, RAD51D).     Interval History: Doing well.  Notes some constipation with recent travel.  Denies any abdominal or pelvic pain.  Denies any vaginal bleeding or discharge.  Reports baseline urinary function.  Past Medical/Surgical History: Past Medical History:  Diagnosis Date   Allergy 1964   Anemia    Bronchitis    Cancer (HCC)    Complication of anesthesia    Endometriosis    Family history of renal cancer 05/09/2021   History of anemia    Migraines    Osteoporosis    PONV (postoperative nausea and vomiting)     Past Surgical History:  Procedure Laterality Date   CESAREAN SECTION  1986, 1990   and BTSP   COLONOSCOPY     DEBULKING N/A 07/02/2021   Procedure: TUMOR DEBULKING;  Surgeon: Carver Fila, MD;  Location: [facility];  Service: Gynecology;  Laterality: N/A;   FRACTURE SURGERY  Sept 2,2021   HYSTERECTOMY ABDOMINAL WITH SALPINGO-OOPHORECTOMY N/A 07/02/2021   Procedure: HYSTERECTOMY ABDOMINAL WITH BILATERAL SALPINGO-OOPHORECTOMY; OMENTECTOMY;  Surgeon: Carver Fila, MD;  Location: [facility];  Service: Gynecology;  Laterality: N/A;   LAPAROSCOPY     for infertility-endometriosis   LAPAROSCOPY     2nd for infertility   mole removed     precancerous mole right upper abd    ORIF WRIST FRACTURE Right 08/02/2020   Procedure: Right distal radius open reduction internal fixation;  Surgeon: Ernest Mallick, MD;  Location: [facility];  Service: Orthopedics;  Laterality: Right;    TONSILLECTOMY AND ADENOIDECTOMY     TUBAL LIGATION  O7/28/1989    Family History  Problem Relation Age of Onset   Renal cancer Father 53       Agent Orange exposure   Cancer Father    Hypertension Sister    Hypothyroidism Sister    Other Sister        Wegener's syndrome   Hypertension Brother    Diabetes Maternal Grandmother    Heart attack Paternal Grandfather    Osteoporosis Other    Colon cancer Neg Hx    Breast cancer Neg Hx    Uterine cancer Neg Hx    Ovarian cancer Neg Hx    Endometrial cancer Neg Hx    Prostate cancer Neg Hx    Pancreatic cancer Neg Hx     Social History   Socioeconomic History  Marital status: Married    Spouse name: Michele Mcalpine   Number of children: 2   Years of education: Not on file   Highest education level: Not on file  Occupational History   Occupation: retired  Tobacco Use   Smoking status: Former    Current packs/day: 0.00    Average packs/day: 1 pack/day for 14.0 years (14.0 ttl pk-yrs)    Types: Cigarettes    Start date: 12/01/1969    Quit date: 12/02/1983    Years since quitting: 39.7   Smokeless tobacco: Never   Tobacco comments:    I have not smoked in 37 years.  Vaping Use   Vaping status: Never Used  Substance and Sexual Activity   Alcohol use: Yes    Comment: Maybe 2 a month.   Drug use: No   Sexual activity: Yes    Partners: Male    Birth control/protection: Post-menopausal, Surgical    Comment: BTL  Other Topics Concern   Not on file  Social History Narrative   Not on file   Social Determinants of Health   Financial Resource Strain: Not on file  Food Insecurity: Not on file  Transportation Needs: No Transportation Needs (08/12/2021)   PRAPARE - Administrator, Civil Service (Medical): No     Lack of Transportation (Non-Medical): No  Physical Activity: Sufficiently Active (08/12/2021)   Exercise Vital Sign    Days of Exercise per Week: 4 days    Minutes of Exercise per Session: 60 min  Stress: No Stress Concern Present (08/12/2021)   [employer] of Occupational Health - Occupational Stress Questionnaire    Feeling of Stress : Not at all  Social Connections: Socially Integrated (08/12/2021)   Social Connection and Isolation Panel [NHANES]    Frequency of Communication with Friends and Family: More than three times a week    Frequency of Social Gatherings with Friends and Family: More than three times a week    Attends Religious Services: More than 4 times per year    Active Member of [employer] or Organizations: Yes    Attends Engineer, structural: More than 4 times per year    Marital Status: Married    Current Medications:  Current Outpatient Medications:    acetaminophen (TYLENOL) 500 MG tablet, Take 500 mg by mouth every 6 (six) hours as needed for moderate pain or headache., Disp: , Rfl:    Boswellia-Glucosamine-Vit D (OSTEO BI-FLEX ONE PER DAY PO), Take by mouth daily., Disp: , Rfl:    Calcium Carbonate-Vit D-Min (CALTRATE 600+D PLUS MINERALS PO), , Disp: , Rfl:    EPINEPHrine 0.3 mg/0.3 mL IJ SOAJ injection, Inject 0.3 mg into the muscle as needed (allergic reaction)., Disp: 2 each, Rfl: 6   ibuprofen (ADVIL) 600 MG tablet, Take 1 tablet (600 mg total) by mouth every 6 (six) hours as needed for moderate pain. For AFTER surgery, Disp: 30 tablet, Rfl: 0   magnesium 30 MG tablet, Take 30 mg by mouth 2 (two) times daily., Disp: , Rfl:    Multiple Vitamins-Minerals (ADULT GUMMY PO), Take 2 capsules by mouth daily., Disp: , Rfl:    OVER THE COUNTER MEDICATION, Take 100 mg by mouth daily. Natures Made Collagen Gummy, Disp: , Rfl:    OVER THE COUNTER MEDICATION, Take 500 mcg by mouth daily at 2 PM. Superior Source K-2, Disp: , Rfl:    senna-docusate (SENOKOT-S) 8.6-50  MG tablet, Take 2 tablets by mouth at bedtime. For AFTER surgery, do  not take if having diarrhea, Disp: 30 tablet, Rfl: 0   SUMAtriptan (IMITREX) 100 MG tablet, 1 tab with headache onset.  Can repeat in 2 hours.  Max dosage 200mg /24 hours., Disp: 36 tablet, Rfl: 4   tamoxifen (NOLVADEX) 20 MG tablet, Take 1 tablet (20 mg total) by mouth daily., Disp: 60 tablet, Rfl: 6   diclofenac Sodium (VOLTAREN) 1 % GEL, Apply 4 g topically 4 (four) times daily as needed. For hip pain. (Patient not taking: Reported on 05/26/2023), Disp: 150 g, Rfl: 3   polyethylene glycol (MIRALAX / GLYCOLAX) 17 g packet, Take 17 g by mouth daily. (Patient not taking: Reported on 08/25/2023), Disp: , Rfl:   Review of Systems: Denies appetite changes, fevers, chills, fatigue, unexplained weight changes. Denies hearing loss, neck lumps or masses, mouth sores, ringing in ears or voice changes. Denies cough or wheezing.  Denies shortness of breath. Denies chest pain or palpitations. Denies leg swelling. Denies abdominal distention, pain, blood in stools, diarrhea, nausea, vomiting, or early satiety. Denies pain with intercourse, dysuria, frequency, hematuria or incontinence. Denies hot flashes, pelvic pain, vaginal bleeding or vaginal discharge.   Denies joint pain, back pain or muscle pain/cramps. Denies itching, rash, or wounds. Denies dizziness, headaches, numbness or seizures. Denies swollen lymph nodes or glands, denies easy bruising or bleeding. Denies anxiety, depression, confusion, or decreased concentration.  Physical Exam: BP 139/80   Pulse 64   Temp 97.8 F (36.6 C)   Resp 20   Wt 135 lb 3.2 oz (61.3 kg)   LMP 10/31/2008   SpO2 100%   BMI 22.53 kg/m  General: Alert, oriented, no acute distress. HEENT: Normocephalic, atraumatic, sclera anicteric. Chest: Unlabored breathing on room air.  Lungs are clear to auscultation bilaterally, no wheezes or rhonchi. Cardiovascular: Regular rate and rhythm, no murmurs,  rubs, or gallops appreciated. Abdomen: soft, nontender.  Normoactive bowel sounds.  No masses or hepatosplenomegaly appreciated.  Well-healed scar. Extremities: Grossly normal range of motion.  Warm, well perfused.  No edema bilaterally. Skin: No rashes or lesions noted. Lymphatics: No cervical, supraclavicular, or inguinal adenopathy. GU: Normal appearing external genitalia without erythema, excoriation, or lesions.  Speculum exam reveals mildly atrophic vaginal mucosa, no lesions or masses, minimal physiologic discharge.  Bimanual exam reveals cuff intact, no nodularity or masses.  Rectovaginal exam confirms these findings.  Laboratory & Radiologic Studies: Component Ref Range & Units 3 mo ago (05/28/23) 6 mo ago (02/20/23) 10 mo ago (10/28/22) 1 yr ago (07/21/22) 1 yr ago (04/25/22) 1 yr ago (01/27/22) 1 yr ago (10/14/21)  Cancer Antigen (CA) 125 0.0 - 38.1 U/mL 10.6 11.8 CM 13.9 CM 10.5 CM 10.7 CM 9.9 CM 11.9 CM   Assessment & Plan: Karen Winters is a 72 y.o. 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 Surgery in 07/2021. Genetic testing shows no somatic or germline mutations. On Tamoxifen maintenance.   Patient continues to do well and is NED on exam.  CA-125 will be drawn today released to her MyChart tomorrow.  Patient has had some recent change in bowel function which she thinks is related to travel and eating differently.  She is about to leave for [place].  I have asked her to call me if her bowel function does not normalize when she returns and is back to her normal diet.   She takes calcium and  vitamin D supplementation.  Based on last bone scan at the end of the 2023, we elected to stop letrozole and start tamoxifen. She is doing very well on Tamoxifen.   Patient is now 2 years out from her initial surgery.  We will  transition to visits every 3-6 months.  We reviewed signs and symptoms that should prompt a phone call to be seen sooner than that visit.  20 minutes of total time was spent for this patient encounter, including preparation, face-to-face counseling with the patient and coordination of care, and documentation of the encounter.  Eugene Garnet, MD  Division of Gynecologic Oncology  Department of Obstetrics and Gynecology  [facility]

## 2023-08-29 LAB — CA 125: Cancer Antigen (CA) 125: 11.3 U/mL (ref 0.0–38.1)

## 2023-09-20 ENCOUNTER — Encounter: Payer: Self-pay | Admitting: Gynecologic Oncology

## 2023-10-02 ENCOUNTER — Encounter (HOSPITAL_BASED_OUTPATIENT_CLINIC_OR_DEPARTMENT_OTHER): Payer: Self-pay | Admitting: *Deleted

## 2023-10-16 ENCOUNTER — Other Ambulatory Visit: Payer: Self-pay

## 2023-10-16 ENCOUNTER — Encounter: Payer: Self-pay | Admitting: Nurse Practitioner

## 2023-10-16 MED ORDER — VALACYCLOVIR HCL 1 G PO TABS: 1000.0000 mg | ORAL_TABLET | Freq: Three times a day (TID) | ORAL | 0 refills | Status: AC

## 2023-11-01 ENCOUNTER — Encounter: Payer: Self-pay | Admitting: Gynecologic Oncology

## 2023-11-03 ENCOUNTER — Other Ambulatory Visit: Payer: Self-pay | Admitting: Gynecologic Oncology

## 2023-11-03 ENCOUNTER — Telehealth: Payer: Self-pay

## 2023-11-03 DIAGNOSIS — C569 Malignant neoplasm of unspecified ovary: Secondary | ICD-10-CM

## 2023-11-03 MED ORDER — TAMOXIFEN CITRATE 20 MG PO TABS: 20.0000 mg | ORAL_TABLET | Freq: Every day | ORAL | 3 refills | Status: DC

## 2023-11-03 NOTE — Telephone Encounter (Signed)
Pt sent a Mychart message stating she needs a refill on her Tamoxifen.   Pt is aware of prescription being sent to pharmacy. She was thankful for the call.  Follow up appointment with Dr. Pricilla Holm on 12/11/23

## 2023-11-10 ENCOUNTER — Encounter: Payer: Self-pay | Admitting: Nurse Practitioner

## 2023-12-11 ENCOUNTER — Inpatient Hospital Stay: Payer: Medicare HMO

## 2023-12-11 ENCOUNTER — Inpatient Hospital Stay: Payer: Medicare HMO | Attending: Gynecologic Oncology | Admitting: Gynecologic Oncology

## 2023-12-11 ENCOUNTER — Encounter: Payer: Self-pay | Admitting: Gynecologic Oncology

## 2023-12-11 VITALS — BP 142/80 | HR 60 | Temp 97.5°F | Resp 19 | Wt 136.6 lb

## 2023-12-11 DIAGNOSIS — D3910 Neoplasm of uncertain behavior of unspecified ovary: Secondary | ICD-10-CM

## 2023-12-11 DIAGNOSIS — Z9079 Acquired absence of other genital organ(s): Secondary | ICD-10-CM | POA: Insufficient documentation

## 2023-12-11 DIAGNOSIS — Z90710 Acquired absence of both cervix and uterus: Secondary | ICD-10-CM | POA: Insufficient documentation

## 2023-12-11 DIAGNOSIS — Z90722 Acquired absence of ovaries, bilateral: Secondary | ICD-10-CM | POA: Insufficient documentation

## 2023-12-11 DIAGNOSIS — Z79810 Long term (current) use of selective estrogen receptor modulators (SERMs): Secondary | ICD-10-CM | POA: Diagnosis not present

## 2023-12-11 DIAGNOSIS — C569 Malignant neoplasm of unspecified ovary: Secondary | ICD-10-CM | POA: Insufficient documentation

## 2023-12-11 DIAGNOSIS — Z9221 Personal history of antineoplastic chemotherapy: Secondary | ICD-10-CM | POA: Insufficient documentation

## 2023-12-11 DIAGNOSIS — M818 Other osteoporosis without current pathological fracture: Secondary | ICD-10-CM

## 2023-12-11 MED ORDER — TAMOXIFEN CITRATE 20 MG PO TABS
20.0000 mg | ORAL_TABLET | Freq: Every day | ORAL | 12 refills | Status: DC
Start: 2023-12-11 — End: 2024-10-13

## 2023-12-11 NOTE — Patient Instructions (Signed)
 It was good to see you today.  I do not see or feel any evidence of cancer recurrence on your exam.  I will see you for follow-up in 5 months.  As always, if you develop any new and concerning symptoms before your next visit, please call to see me sooner.

## 2023-12-11 NOTE — Progress Notes (Signed)
 Gynecologic Oncology Return Clinic Visit  12/11/23  Reason for Visit: surveillance   Treatment History: Oncology History Overview Note  Borderline serous carcinoma   Ovarian cancer (HCC)  04/01/2021 Initial Diagnosis   She notes that she noticed an umbilical hernia.  She was scheduled to see a surgeon about possible repair.  She also endorses intermittent pain in around the area of her right hip that she thought was secondary to her exercise routine.  Over the last week, she has had a decreased appetite.  She denies any nausea or emesis but endorses several months of abdominal bloating and early satiety.  She notes normal bowel function, which she describes as bowel movements at least daily.  She has had some increased urinary frequency and around Easter developed stress urinary incontinence when she runs.     04/04/2021 Imaging   US  pelvis  Septate uterus, otherwise unremarkable.   Normal endometrial complex.   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   Recommend further assessment by CT imaging with IV and oral contrast.   04/05/2021 Tumor Marker   Patient's tumor was tested for the following markers: CA-125 Results of the tumor marker test revealed 826.   04/05/2021 Imaging   1. Large bilateral solid-appearing adnexal masses are identified concerning for ovarian neoplasm. There is soft tissue infiltration into the surrounding peritoneal cavity which partially encases nonobstructed loops of small bowel with probable serosal involvement. 2. Moderate volume of ascites identified within the abdomen and pelvis likely secondary to peritoneal carcinomatosis. Diagnostic paracentesis may be helpful for further workup. 3. Mild increased caliber of small bowel loops with a few air-fluid levels. However, there are no signs to suggest a bowel obstruction as enteric contrast material is noted up to  the level of the hepatic flexure.   04/10/2021 Initial Diagnosis   Ovarian cancer (HCC)   04/10/2021 Procedure   Successful ultrasound-guided paracentesis yielding 1.4 liters of peritoneal fluid.   04/11/2021 Cancer Staging   Staging form: Ovary, Fallopian Tube, and Primary Peritoneal Carcinoma, AJCC 8th Edition - Clinical stage from 04/11/2021: Stage IIIC (cT3c, cN0, cM0) - Signed by Lonn Hicks, MD on 04/11/2021 Stage prefix: Initial diagnosis   04/18/2021 Tumor Marker   Patient's tumor was tested for the following markers: CA-125. Results of the tumor marker test revealed 771   04/22/2021 - 06/05/2021 Chemotherapy         05/14/2021 Tumor Marker   Patient's tumor was tested for the following markers: CA-125. Results of the tumor marker test revealed 877.   06/10/2021 Imaging   1. Today's study demonstrates a centrally stable disease when compared to prior examination from 04/05/2021, with large bilateral ovarian lesions and moderate volume of malignant ascites, as detailed above. 2. Trace right pleural effusion lying dependently.     07/02/2021 Surgery     Preoperative Diagnosis: Malignancy presumed to be of gyn origin based on IHC, lack of clinical response to NACT   Postoperative Diagnosis: Stage IIIC presumed ovarian cancer    Procedure(s) Performed: Exploratory laparotomy with total hysterectomy bilateral salpingo-oophorectomy, total abdominal hysterectomy, tumor debulking including omentectomy and excision of several tumor plaques versus treated disease, oversew of bladder peritoneum   Surgeon: Odean Dollar, MD    Specimens: Uterus Cervix, Bilateral tubes / ovaries, peritoneal plaques (sent with uterus and cervix), omentum   Estimated Blood Loss: 250 mL.     Ascites: approximately 2L upon entry and produced during surgery  Operative Findings: Small mobile uterus, nodular masses filling the cul de sac. On intra-abdominal entry, approximately 1.6L of green-tinged ascites was  encountered. Normal liver edge, diaphragm, and stomach. Infracolic omentum with a 6x4cm nodular area and several smaller (1-3cm) deposits c/w tumor. Otherwise, omentum without evidence of disease. Small fibrinous debris on the small bowel, no obvious tumor implants. Mesentery free of disease. No appreciable adenopathy. Bilateral ovaries replaced with vesicular tumors (L>R), measuring approximately 8cm and 12cm. Several areas of plaque (appeared more consistent with treated tumor than active cancer - right pelvic sidewall, posterior cul de sac, and anterior cul de sac) noted, most excised. Uterus 4-6cm in size. Dense adhesions of the bladder to the LUS and cervix, likely combination of prior c-section and treatment effect. R0 resection at the end of surgery if peritoneal plaques not c/w tumor. No clear umbilical hernia.    07/02/2021 Pathology Results   A. 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   - Surface involvement present.  - Fallopian tube involvement present, non-invasive.   B. OVARY AND FALLOPIAN TUBE, LEFT, SALPINGO OOPHORECTOMY:  - Serous borderline tumor.  - Surface involvement present, non-invasive.  - Fallopian tube involvement present, non-invasive.   C. UTERUS AND CERVIX, HYSTERECTOMY:  - Uterus:       Endometrium: Benign endometrial type polyp. Inactive endometrium.  No hyperplasia or malignancy.       Myometrium: Unremarkable. No malignancy.       Serosa: Invasive serous implant.  - Cervix: Benign squamous and endocervical mucosa. No dysplasia or  malignancy.  - Bilateral fallopian tubes: Surface involvement present.   D. OMENTUM:  - Non-invasive serous implant.  - One of one lymph nodes negative for carcinoma (0/1).   ONCOLOGY TABLE:   OVARY or FALLOPIAN TUBE or PRIMARY PERITONEUM: Resection   Procedure: Hysterectomy with bilateral salpingo-oophorectomy and omental  resection.   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  Other Tissue/ Organ Involvement: Not applicable  Largest Extrapelvic Peritoneal Focus: 2 mm  Peritoneal/Ascitic Fluid Involvement: Not applicable  Chemotherapy Response Score (CRS): Cannot be determined  Regional Lymph Nodes:       Number of Nodes with Metastasis Greater than 10 mm: 0       Number of Nodes with Metastasis 10 mm or Less (excludes isolated  tumor cells): 0       Number of Nodes with Isolated Tumor Cells (0.2 mm or less): 0       Number of Lymph Nodes Examined: 1  Distant Metastasis:       Distant Site(s) Involved: Not applicable  Pathologic Stage Classification (pTNM, AJCC 8th Edition): ypT3a, ypN0  Ancillary Studies: Can be performed upon request  Representative Tumor Block: A7  Comment(s): Given extensive involvement and neoadjuvant therapy it is  difficult to determine the exact primary location, but the right ovary  is slightly favored. Dr. Belvie has reviewed the case.    07/02/2021 Pathology Results   FINAL MICROSCOPIC DIAGNOSIS:   A. 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   -  Surface involvement present.  - Fallopian tube involvement present, non-invasive.   B. OVARY AND FALLOPIAN TUBE, LEFT, SALPINGO OOPHORECTOMY:  - Serous borderline tumor.  - Surface involvement present, non-invasive.  - Fallopian tube involvement present, non-invasive.   C. UTERUS AND CERVIX, HYSTERECTOMY:  - Uterus:       Endometrium:  Benign endometrial type polyp. Inactive endometrium.  No hyperplasia or malignancy.       Myometrium: Unremarkable. No malignancy.       Serosa: Invasive serous implant.  - Cervix: Benign squamous and endocervical mucosa. No dysplasia or malignancy.  - Bilateral fallopian tubes: Surface involvement present.   D. OMENTUM:  - Non-invasive serous implant.  - One of one lymph nodes negative for carcinoma (0/1).   ONCOLOGY TABLE:   OVARY or FALLOPIAN TUBE or PRIMARY PERITONEUM: Resection   Procedure: Hysterectomy with bilateral salpingo-oophorectomy and omental resection.  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  Other Tissue/ Organ Involvement: Not applicable  Largest Extrapelvic Peritoneal Focus: 2 mm  Peritoneal/Ascitic Fluid Involvement: Not applicable  Chemotherapy Response Score (CRS): Cannot be determined  Regional Lymph Nodes:       Number of Nodes with Metastasis Greater than 10 mm: 0       Number of Nodes with Metastasis 10 mm or Less (excludes isolated  tumor cells): 0       Number of Nodes with Isolated Tumor Cells (0.2 mm or less): 0       Number of Lymph Nodes Examined: 1  Distant Metastasis:       Distant Site(s) Involved: Not applicable  Pathologic Stage Classification (pTNM, AJCC 8th Edition): ypT3a, ypN0  Ancillary Studies: Can be performed upon request  Representative Tumor Block: A7  Comment(s): Given extensive involvement and neoadjuvant therapy it is difficult to determine the exact primary location, but the right ovary is slightly favored. Dr. Belvie  has reviewed the case.    07/02/2021 Surgery   Date of Service: 07/02/21   Preoperative Diagnosis: Malignancy presumed to be of gyn origin based on IHC, lack of clinical response to NACT   Postoperative Diagnosis: Stage IIIC presumed ovarian cancer    Procedure(s) Performed: Exploratory laparotomy with total hysterectomy bilateral salpingo-oophorectomy, total abdominal hysterectomy, tumor debulking including omentectomy and excision of several tumor plaques versus treated disease, oversew of bladder peritoneum   Surgeon: Odean Dollar, MD Specimens: Uterus Cervix, Bilateral tubes / ovaries, peritoneal plaques (sent with uterus and cervix), omentum   Estimated Blood Loss: 250 mL.     Ascites: approximately 2L upon entry and produced during surgery Operative Findings: Small mobile uterus, nodular masses filling the cul de sac. On intra-abdominal entry, approximately 1.6L of green-tinged ascites was encountered. Normal liver edge, diaphragm, and stomach. Infracolic omentum with a 6x4cm nodular area and several smaller (1-3cm) deposits c/w tumor. Otherwise, omentum without evidence of disease. Small fibrinous debris on the small bowel, no obvious tumor implants. Mesentery free of disease. No appreciable adenopathy. Bilateral ovaries replaced with vesicular tumors (L>R), measuring approximately 8cm and 12cm. Several areas of plaque (appeared more consistent with treated tumor than active cancer - right pelvic sidewall, posterior cul de sac, and anterior cul de sac) noted, most  excised. Uterus 4-6cm in size. Dense adhesions of the bladder to the LUS and cervix, likely combination of prior c-section and treatment effect. R0 resection at the end of surgery if peritoneal plaques not c/w tumor. No clear umbilical hernia.    07/17/2021 Tumor Marker   Patient's tumor was tested for the following markers: CA-125. Results of the tumor marker test revealed 116.   08/06/2021 Genetic Testing   Negative genetic testing:  no pathogenic variants detected in [facility] TumorNext-HRD + CancerNext Panel.  The report date is August 06, 2021.    The CancerNext gene panel offered by [employer] includes sequencing and rearrangement analysis for the following 37 genes:   APC, ATM, AXIN2 BARD1, BMPR1A, BRCA1, BRCA2, BRIP1, CDH1, CDK4, CDKN2A, CHEK2, DICER1, HOXB13, EPCAM, GREM1, MLH1, MRE11A, MSH2, MSH3, MSH6, MUTYH, NBN, NF1, PALB2, PMS2, POLD1, POLE, PTEN, RAD50, RAD51C, RAD51D, RECQL, SMAD4, SMARCA4, STK11, and TP53. TumorNext-HRD is a paired tumor and germline analysis of BRCA1 and BRCA2 plus 9 additional genes in the homologous recombination repair pathway (ATM, BARD1, BRCA1, BRCA2, BRIP1, CHEK2, MRE11A, NBN, PALB2, RAD51C, RAD51D).     Interval History: Doing well.  Has a lovely trip to [place] since I last saw her.  Had a mammo plus heart evaluation in October that does not indicate the presence of breast arterial calcifications.  Denies any abdominal or pelvic pain.  Reports excellent bowel function that normalized after her last visit with me.  Denies any urinary symptoms.  Denies vaginal bleeding.  Had shingles immediately after her second shingles vaccine and again shortly after.  Past Medical/Surgical History: Past Medical History:  Diagnosis Date   Allergy 1964   Anemia    Bronchitis    Cancer (HCC)    Complication of anesthesia    Endometriosis    Family history of renal cancer 05/09/2021   History of anemia    Migraines    Osteoporosis    PONV (postoperative nausea and vomiting)     Past Surgical History:  Procedure Laterality Date   CESAREAN SECTION  1986, 1990   and BTSP   COLONOSCOPY     DEBULKING N/A 07/02/2021   Procedure: TUMOR DEBULKING;  Surgeon: Viktoria Comer SAUNDERS, MD;  Location: [facility];  Service: Gynecology;  Laterality: N/A;   FRACTURE SURGERY  Sept 2,2021   HYSTERECTOMY ABDOMINAL WITH SALPINGO-OOPHORECTOMY N/A 07/02/2021   Procedure: HYSTERECTOMY ABDOMINAL WITH BILATERAL  SALPINGO-OOPHORECTOMY; OMENTECTOMY;  Surgeon: Viktoria Comer SAUNDERS, MD;  Location: [facility];  Service: Gynecology;  Laterality: N/A;   LAPAROSCOPY     for infertility-endometriosis   LAPAROSCOPY     2nd for infertility   mole removed     precancerous mole right upper abd   ORIF WRIST FRACTURE Right 08/02/2020   Procedure: Right distal radius open reduction internal fixation;  Surgeon: Carolee Lynwood JINNY DOUGLAS, MD;  Location: [facility];  Service: Orthopedics;  Laterality: Right;    TONSILLECTOMY AND ADENOIDECTOMY     TUBAL LIGATION  O7/28/1989    Family History  Problem Relation Age of Onset   Renal cancer Father 26       Agent Orange exposure   Cancer Father    Hypertension Sister    Hypothyroidism Sister    Other Sister        Wegener's syndrome   Hypertension Brother    Diabetes Maternal Grandmother    Heart attack Paternal Grandfather    Osteoporosis Other    Colon cancer Neg Hx    Breast cancer Neg Hx  Uterine cancer Neg Hx    Ovarian cancer Neg Hx    Endometrial cancer Neg Hx    Prostate cancer Neg Hx    Pancreatic cancer Neg Hx     Social History   Socioeconomic History   Marital status: Married    Spouse name: Phil   Number of children: 2   Years of education: Not on file   Highest education level: Not on file  Occupational History   Occupation: retired  Tobacco Use   Smoking status: Former    Current packs/day: 0.00    Average packs/day: 1 pack/day for 14.0 years (14.0 ttl pk-yrs)    Types: Cigarettes    Start date: 12/01/1969    Quit date: 12/02/1983    Years since quitting: 40.0   Smokeless tobacco: Never   Tobacco comments:    I have not smoked in 37 years.  Vaping Use   Vaping status: Never Used  Substance and Sexual Activity   Alcohol use: Yes    Comment: Maybe 2 a month.   Drug use: No   Sexual activity: Yes    Partners: Male    Birth control/protection: Post-menopausal, Surgical    Comment: BTL  Other Topics Concern   Not on file  Social  History Narrative   Not on file   Social Drivers of Health   Financial Resource Strain: Not on file  Food Insecurity: Not on file  Transportation Needs: No Transportation Needs (08/12/2021)   PRAPARE - Administrator, Civil Service (Medical): No    Lack of Transportation (Non-Medical): No  Physical Activity: Sufficiently Active (08/12/2021)   Exercise Vital Sign    Days of Exercise per Week: 4 days    Minutes of Exercise per Session: 60 min  Stress: No Stress Concern Present (08/12/2021)   [employer] of Occupational Health - Occupational Stress Questionnaire    Feeling of Stress : Not at all  Social Connections: Socially Integrated (08/12/2021)   Social Connection and Isolation Panel [NHANES]    Frequency of Communication with Friends and Family: More than three times a week    Frequency of Social Gatherings with Friends and Family: More than three times a week    Attends Religious Services: More than 4 times per year    Active Member of [employer] or Organizations: Yes    Attends Engineer, Structural: More than 4 times per year    Marital Status: Married    Current Medications:  Current Outpatient Medications:    acetaminophen  (TYLENOL ) 500 MG tablet, Take 500 mg by mouth every 6 (six) hours as needed for moderate pain or headache., Disp: , Rfl:    Boswellia-Glucosamine-Vit D (OSTEO BI-FLEX ONE PER DAY PO), Take by mouth daily., Disp: , Rfl:    Calcium Carbonate-Vit D-Min (CALTRATE 600+D PLUS MINERALS PO), , Disp: , Rfl:    EPINEPHrine  0.3 mg/0.3 mL IJ SOAJ injection, Inject 0.3 mg into the muscle as needed (allergic reaction)., Disp: 2 each, Rfl: 6   ibuprofen  (ADVIL ) 600 MG tablet, Take 1 tablet (600 mg total) by mouth every 6 (six) hours as needed for moderate pain. For AFTER surgery, Disp: 30 tablet, Rfl: 0   magnesium 30 MG tablet, Take 30 mg by mouth 2 (two) times daily., Disp: , Rfl:    Multiple Vitamins-Minerals (ADULT GUMMY PO), Take 2 capsules by  mouth daily., Disp: , Rfl:    OVER THE COUNTER MEDICATION, Take 100 mg by mouth daily. Natures Made Collagen Gummy,  Disp: , Rfl:    OVER THE COUNTER MEDICATION, Take 500 mcg by mouth daily at 2 PM. Superior Source K-2, Disp: , Rfl:    polyethylene glycol (MIRALAX / GLYCOLAX) 17 g packet, Take 17 g by mouth daily., Disp: , Rfl:    senna-docusate (SENOKOT-S) 8.6-50 MG tablet, Take 2 tablets by mouth at bedtime. For AFTER surgery, do not take if having diarrhea, Disp: 30 tablet, Rfl: 0   SUMAtriptan  (IMITREX ) 100 MG tablet, 1 tab with headache onset.  Can repeat in 2 hours.  Max dosage 200mg /24 hours., Disp: 36 tablet, Rfl: 4   tamoxifen  (NOLVADEX ) 20 MG tablet, Take 1 tablet (20 mg total) by mouth daily., Disp: 30 tablet, Rfl: 12  Review of Systems: Denies appetite changes, fevers, chills, fatigue, unexplained weight changes. Denies hearing loss, neck lumps or masses, mouth sores, ringing in ears or voice changes. Denies cough or wheezing.  Denies shortness of breath. Denies chest pain or palpitations. Denies leg swelling. Denies abdominal distention, pain, blood in stools, constipation, diarrhea, nausea, vomiting, or early satiety. Denies pain with intercourse, dysuria, frequency, hematuria or incontinence. Denies hot flashes, pelvic pain, vaginal bleeding or vaginal discharge.   Denies joint pain, back pain or muscle pain/cramps. Denies itching, rash, or wounds. Denies dizziness, headaches, numbness or seizures. Denies swollen lymph nodes or glands, denies easy bruising or bleeding. Denies anxiety, depression, confusion, or decreased concentration.  Physical Exam: BP (!) 148/91 (BP Location: Right Arm, Patient Position: Sitting) Comment: Notified RN  Pulse 60   Temp (!) 97.5 F (36.4 C) (Oral) Comment: Patient states she usually runs low in temp  Resp 19   Wt 136 lb 9.6 oz (62 kg)   LMP 10/31/2008   SpO2 100%   BMI 22.76 kg/m  General: Alert, oriented, no acute distress. HEENT:  Normocephalic, atraumatic, sclera anicteric. Chest: Unlabored breathing on room air.  Lungs are clear to auscultation bilaterally, no wheezes or rhonchi. Cardiovascular: Regular rate and rhythm, no murmurs, rubs, or gallops appreciated. Abdomen: soft, nontender.  Normoactive bowel sounds.  No masses or hepatosplenomegaly appreciated.  Well-healed scar. Extremities: Grossly normal range of motion.  Warm, well perfused.  No edema bilaterally. Skin: No rashes or lesions noted. Lymphatics: No cervical, supraclavicular, or inguinal adenopathy. GU: Normal appearing external genitalia without erythema, excoriation, or lesions.  Speculum exam reveals mildly atrophic vaginal mucosa, no lesions or masses, minimal physiologic discharge.  Bimanual exam reveals cuff intact, no nodularity or masses.  Rectovaginal exam confirms these findings.  Laboratory & Radiologic Studies:          Component Ref Range & Units (hover) 3 mo ago (08/28/23) 6 mo ago (05/28/23) 9 mo ago (02/20/23) 1 yr ago (10/28/22) 1 yr ago (07/21/22) 1 yr ago (04/25/22) 1 yr ago (01/27/22)  Cancer Antigen (CA) 125 11.3 10.6 CM 11.8 CM 13.9 CM 10.5 CM 10.7 CM 9.9      Assessment & Plan: Karen Winters is a 73 y.o. 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 Surgery in 07/2021. Genetic testing shows no somatic or germline mutations. On Tamoxifen  maintenance.   Patient continues to do well and is NED on exam.  CA-125 drawn today.   She takes calcium and vitamin D  supplementation.  Based on last bone scan at the end of the 2023, we elected to stop letrozole  and start tamoxifen . She is  doing very well on Tamoxifen .   Patient is now 2 years out from her initial surgery.  We will transition to visits every 3-6 months.  Next visit will be in 5 months. We reviewed signs and symptoms that  should prompt a phone call to be seen sooner than that visit.  22 minutes of total time was spent for this patient encounter, including preparation, face-to-face counseling with the patient and coordination of care, and documentation of the encounter.  Comer Dollar, MD  Division of Gynecologic Oncology  Department of Obstetrics and Gynecology  [facility]

## 2023-12-13 LAB — CA 125: Cancer Antigen (CA) 125: 12.2 U/mL (ref 0.0–38.1)

## 2023-12-15 ENCOUNTER — Encounter: Payer: Self-pay | Admitting: Gynecologic Oncology

## 2024-04-14 ENCOUNTER — Ambulatory Visit: Payer: Medicare HMO | Admitting: Nurse Practitioner

## 2024-05-06 ENCOUNTER — Ambulatory Visit (HOSPITAL_BASED_OUTPATIENT_CLINIC_OR_DEPARTMENT_OTHER): Payer: Medicare HMO | Admitting: Obstetrics & Gynecology

## 2024-05-06 ENCOUNTER — Encounter (HOSPITAL_BASED_OUTPATIENT_CLINIC_OR_DEPARTMENT_OTHER): Payer: Self-pay | Admitting: Obstetrics & Gynecology

## 2024-05-06 VITALS — BP 150/96 | HR 67 | Ht 64.96 in | Wt 139.8 lb

## 2024-05-06 DIAGNOSIS — M810 Age-related osteoporosis without current pathological fracture: Secondary | ICD-10-CM | POA: Diagnosis not present

## 2024-05-06 DIAGNOSIS — C563 Malignant neoplasm of bilateral ovaries: Secondary | ICD-10-CM

## 2024-05-06 DIAGNOSIS — Z9189 Other specified personal risk factors, not elsewhere classified: Secondary | ICD-10-CM

## 2024-05-06 DIAGNOSIS — Z13820 Encounter for screening for osteoporosis: Secondary | ICD-10-CM

## 2024-05-06 NOTE — Progress Notes (Signed)
 Breast and Pelvic Exam Patient name: Karen Winters MRN [id number]  Date of birth: 05-24-1951 Chief Complaint:   Breast and Pelvic Exam  History of Present Illness:   Karen Winters is a 73 y.o. G2P2 Caucasian female being seen today for breast and pelvic exam.  H/o ovarian serous adenocarcinoma.  Followed gyn oncology.  Next ca-125 is scheduled for next month.  Did genetic testing.    Denies vaginal bleeding.  Does have some sensation of incomplete emptying but will wait and void again and then feels empty.  Denies bowel changes.    Patient's last menstrual period was 10/31/2008.   Last pap 04/03/2021. Results were: NILM w/ HRHPV negative. H/O abnormal pap: no Last mammogram: 09/29/2023 . Results were: normal.  Last colonoscopy: 01/03/2015  . Results were: normal. Family h/o colorectal cancer: no Dexa:  11/03/2022     05/06/2023    8:26 AM 04/14/2023    8:33 AM 03/24/2022   10:17 AM 08/12/2021   11:22 AM  Depression screen PHQ 2/9  Decreased Interest 0 0 0 0  Down, Depressed, Hopeless 0 0 0 0  PHQ - 2 Score 0 0 0 0  Altered sleeping    0  Tired, decreased energy    0  Change in appetite    0  Feeling bad or failure about yourself     0  Trouble concentrating    0  Moving slowly or fidgety/restless    0  Suicidal thoughts    0  PHQ-9 Score    0        08/12/2021   11:22 AM  GAD 7 : Generalized Anxiety Score  Nervous, Anxious, on Edge 0  Control/stop worrying 0  Worry too much - different things 0  Trouble relaxing 0  Restless 0  Easily annoyed or irritable 0  Afraid - awful might happen 0  Total GAD 7 Score 0     Review of Systems:   Pertinent items are noted in HPI Denies any urinary or bowel changes Pertinent History Reviewed:  Reviewed past medical,surgical, social and family history.  Reviewed problem list, medications and allergies. Physical Assessment:   Vitals:   05/06/24 0956  BP: (!) 150/96  Pulse: 67  Weight: 139 lb 12.8 oz (63.4 kg)  Height:  5' 4.96" (1.65 m)  Body mass index is 23.29 kg/m.        Physical Examination:   General appearance - well appearing, and in no distress  Mental status - alert, oriented to person, place, and time  Psych:  She has a normal mood and affect  Skin - warm and dry, normal color, no suspicious lesions noted  Chest - effort normal, all lung fields clear to auscultation bilaterally  Heart - normal rate and regular rhythm  Neck:  midline trachea, no thyromegaly or nodules  Breasts - breasts appear normal, no suspicious masses, no skin or nipple changes or axillary nodes  Abdomen - soft, nontender, nondistended, no masses or organomegaly  Pelvic - VULVA: normal appearing vulva with no masses, tenderness or lesions   VAGINA: normal appearing vagina with normal color and discharge, no lesions   CERVIX: surgically absent  Thin prep pap is not indicated today.  UTERUS: surgically absent  ADNEXA: No adnexal masses or tenderness noted.  Rectal - normal rectal, good sphincter tone, no masses felt.   Extremities:  No swelling or varicosities noted  Chaperone present for exam  Assessment & Plan:  1. GYN exam  for high-risk Medicare patient (Primary) - Pap smear not indicated - Mammogram 10/24 - Colonoscopy 2016 - Bone mineral density 10/2022 - lab work done with PCP, Abraham Hoffmann Early - vaccines reviewed/updated  2. Malignant neoplasm of both ovaries (HCC) - followed by Dr. Orvil Bland - on Tamoxifen  - exam normal today  3. Age-related osteoporosis without current pathological fracture - taking calcium with Vit D  - this will be repeated early next year   No orders of the defined types were placed in this encounter.   Meds: No orders of the defined types were placed in this encounter.   Follow-up: Return in about 1 year (around 05/06/2025).  Lillian Rein, MD 05/09/2024 10:50 PM

## 2024-05-10 ENCOUNTER — Encounter: Payer: Self-pay | Admitting: Gynecologic Oncology

## 2024-05-11 ENCOUNTER — Other Ambulatory Visit: Payer: Self-pay | Admitting: Gynecologic Oncology

## 2024-05-11 DIAGNOSIS — C569 Malignant neoplasm of unspecified ovary: Secondary | ICD-10-CM

## 2024-05-12 ENCOUNTER — Inpatient Hospital Stay: Payer: Medicare HMO | Admitting: Gynecologic Oncology

## 2024-05-12 ENCOUNTER — Encounter: Payer: Self-pay | Admitting: Gynecologic Oncology

## 2024-05-12 ENCOUNTER — Inpatient Hospital Stay: Payer: Medicare HMO | Attending: Gynecologic Oncology | Admitting: Gynecologic Oncology

## 2024-05-12 VITALS — BP 109/71 | HR 62 | Temp 97.6°F | Resp 16 | Ht 64.96 in | Wt 139.2 lb

## 2024-05-12 DIAGNOSIS — Z9079 Acquired absence of other genital organ(s): Secondary | ICD-10-CM | POA: Diagnosis not present

## 2024-05-12 DIAGNOSIS — Z90722 Acquired absence of ovaries, bilateral: Secondary | ICD-10-CM | POA: Insufficient documentation

## 2024-05-12 DIAGNOSIS — Z79810 Long term (current) use of selective estrogen receptor modulators (SERMs): Secondary | ICD-10-CM | POA: Diagnosis not present

## 2024-05-12 DIAGNOSIS — Z9221 Personal history of antineoplastic chemotherapy: Secondary | ICD-10-CM | POA: Diagnosis not present

## 2024-05-12 DIAGNOSIS — M818 Other osteoporosis without current pathological fracture: Secondary | ICD-10-CM

## 2024-05-12 DIAGNOSIS — C569 Malignant neoplasm of unspecified ovary: Secondary | ICD-10-CM

## 2024-05-12 DIAGNOSIS — Z90710 Acquired absence of both cervix and uterus: Secondary | ICD-10-CM | POA: Diagnosis not present

## 2024-05-12 DIAGNOSIS — C561 Malignant neoplasm of right ovary: Secondary | ICD-10-CM | POA: Diagnosis not present

## 2024-05-12 NOTE — Patient Instructions (Signed)
 It was good to see you today.  I do not see or feel any evidence of cancer recurrence on your exam.  I will see you for follow-up in 5 months.  As always, if you develop any new and concerning symptoms before your next visit, please call to see me sooner.

## 2024-05-12 NOTE — Progress Notes (Signed)
 Gynecologic Oncology Return Clinic Visit  05/12/24  Reason for Visit: surveillance   Treatment History: Oncology History Overview Note  Borderline serous carcinoma   Ovarian cancer (HCC)  04/01/2021 Initial Diagnosis   She notes that she noticed an umbilical hernia.  She was scheduled to see a surgeon about possible repair.  She also endorses intermittent pain in around the area of her right hip that she thought was secondary to her exercise routine.  Over the last week, she has had a decreased appetite.  She denies any nausea or emesis but endorses several months of abdominal bloating and early satiety.  She notes normal bowel function, which she describes as bowel movements at least daily.  She has had some increased urinary frequency and around [place] developed stress urinary incontinence when she runs.     04/04/2021 Imaging   US  pelvis  Septate uterus, otherwise unremarkable.   Normal endometrial complex.   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   Recommend further assessment by CT imaging with IV and oral contrast.   04/05/2021 Tumor Marker   Patient's tumor was tested for the following markers: CA-125 Results of the tumor marker test revealed 826.   04/05/2021 Imaging   1. Large bilateral solid-appearing adnexal masses are identified concerning for ovarian neoplasm. There is soft tissue infiltration into the surrounding peritoneal cavity which partially encases nonobstructed loops of small bowel with probable serosal involvement. 2. Moderate volume of ascites identified within the abdomen and pelvis likely secondary to peritoneal carcinomatosis. Diagnostic paracentesis may be helpful for further workup. 3. Mild increased caliber of small bowel loops with a few air-fluid levels. However, there are no signs to suggest a bowel obstruction as enteric contrast material is noted up to  the level of the hepatic flexure.   04/10/2021 Initial Diagnosis   Ovarian cancer (HCC)   04/10/2021 Procedure   Successful ultrasound-guided paracentesis yielding 1.4 liters of peritoneal fluid.   04/11/2021 Cancer Staging   Staging form: Ovary, Fallopian Tube, and Primary Peritoneal Carcinoma, AJCC 8th Edition - Clinical stage from 04/11/2021: Stage IIIC (cT3c, cN0, cM0) - Signed by Almeda Jacobs, MD on 04/11/2021 Stage prefix: Initial diagnosis   04/18/2021 Tumor Marker   Patient's tumor was tested for the following markers: CA-125. Results of the tumor marker test revealed 771   04/22/2021 - 06/05/2021 Chemotherapy         05/14/2021 Tumor Marker   Patient's tumor was tested for the following markers: CA-125. Results of the tumor marker test revealed 877.   06/10/2021 Imaging   1. Today's study demonstrates a centrally stable disease when compared to prior examination from 04/05/2021, with large bilateral ovarian lesions and moderate volume of malignant ascites, as detailed above. 2. Trace right pleural effusion lying dependently.     07/02/2021 Surgery     Preoperative Diagnosis: Malignancy presumed to be of gyn origin based on IHC, lack of clinical response to NACT   Postoperative Diagnosis: Stage IIIC presumed ovarian cancer    Procedure(s) Performed: Exploratory laparotomy with total hysterectomy bilateral salpingo-oophorectomy, total abdominal hysterectomy, tumor debulking including omentectomy and excision of several tumor plaques versus treated disease, oversew of bladder peritoneum   Surgeon: Brinton Canavan, MD    Specimens: Uterus Cervix, Bilateral tubes / ovaries, peritoneal plaques (sent with uterus and cervix), omentum   Estimated Blood Loss: 250 mL.     Ascites: approximately 2L upon entry and produced during surgery  Operative Findings: Small mobile uterus, nodular masses filling the cul de sac. On intra-abdominal entry, approximately 1.6L of green-tinged ascites was  encountered. Normal liver edge, diaphragm, and stomach. Infracolic omentum with a 6x4cm nodular area and several smaller (1-3cm) deposits c/w tumor. Otherwise, omentum without evidence of disease. Small fibrinous debris on the small bowel, no obvious tumor implants. Mesentery free of disease. No appreciable adenopathy. Bilateral ovaries replaced with vesicular tumors (L>R), measuring approximately 8cm and 12cm. Several areas of plaque (appeared more consistent with treated tumor than active cancer - right pelvic sidewall, posterior cul de sac, and anterior cul de sac) noted, most excised. Uterus 4-6cm in size. Dense adhesions of the bladder to the LUS and cervix, likely combination of prior c-section and treatment effect. R0 resection at the end of surgery if peritoneal plaques not c/w tumor. No clear umbilical hernia.    07/02/2021 Pathology Results   A. 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   - Surface involvement present.  - Fallopian tube involvement present, non-invasive.   B. OVARY AND FALLOPIAN TUBE, LEFT, SALPINGO OOPHORECTOMY:  - Serous borderline tumor.  - Surface involvement present, non-invasive.  - Fallopian tube involvement present, non-invasive.   C. UTERUS AND CERVIX, HYSTERECTOMY:  - Uterus:       Endometrium: Benign endometrial type polyp. Inactive endometrium.  No hyperplasia or malignancy.       Myometrium: Unremarkable. No malignancy.       Serosa: Invasive serous implant.  - Cervix: Benign squamous and endocervical mucosa. No dysplasia or  malignancy.  - Bilateral fallopian tubes: Surface involvement present.   D. OMENTUM:  - Non-invasive serous implant.  - One of one lymph nodes negative for carcinoma (0/1).   ONCOLOGY TABLE:   OVARY or FALLOPIAN TUBE or PRIMARY PERITONEUM: Resection   Procedure: Hysterectomy with bilateral salpingo-oophorectomy and omental  resection.   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  Other Tissue/ Organ Involvement: Not applicable  Largest Extrapelvic Peritoneal Focus: 2 mm  Peritoneal/Ascitic Fluid Involvement: Not applicable  Chemotherapy Response Score (CRS): Cannot be determined  Regional Lymph Nodes:       Number of Nodes with Metastasis Greater than 10 mm: 0       Number of Nodes with Metastasis 10 mm or Less (excludes isolated  tumor cells): 0       Number of Nodes with Isolated Tumor Cells (0.2 mm or less): 0       Number of Lymph Nodes Examined: 1  Distant Metastasis:       Distant Site(s) Involved: Not applicable  Pathologic Stage Classification (pTNM, AJCC 8th Edition): ypT3a, ypN0  Ancillary Studies: Can be performed upon request  Representative Tumor Block: A7  Comment(s): Given extensive involvement and neoadjuvant therapy it is  difficult to determine the exact primary location, but the right ovary  is slightly favored. Dr. Portia Brittle has reviewed the case.    07/02/2021 Pathology Results   FINAL MICROSCOPIC DIAGNOSIS:   A. 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   -  Surface involvement present.  - Fallopian tube involvement present, non-invasive.   B. OVARY AND FALLOPIAN TUBE, LEFT, SALPINGO OOPHORECTOMY:  - Serous borderline tumor.  - Surface involvement present, non-invasive.  - Fallopian tube involvement present, non-invasive.   C. UTERUS AND CERVIX, HYSTERECTOMY:  - Uterus:       Endometrium:  Benign endometrial type polyp. Inactive endometrium.  No hyperplasia or malignancy.       Myometrium: Unremarkable. No malignancy.       Serosa: Invasive serous implant.  - Cervix: Benign squamous and endocervical mucosa. No dysplasia or malignancy.  - Bilateral fallopian tubes: Surface involvement present.   D. OMENTUM:  - Non-invasive serous implant.  - One of one lymph nodes negative for carcinoma (0/1).   ONCOLOGY TABLE:   OVARY or FALLOPIAN TUBE or PRIMARY PERITONEUM: Resection   Procedure: Hysterectomy with bilateral salpingo-oophorectomy and omental resection.  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  Other Tissue/ Organ Involvement: Not applicable  Largest Extrapelvic Peritoneal Focus: 2 mm  Peritoneal/Ascitic Fluid Involvement: Not applicable  Chemotherapy Response Score (CRS): Cannot be determined  Regional Lymph Nodes:       Number of Nodes with Metastasis Greater than 10 mm: 0       Number of Nodes with Metastasis 10 mm or Less (excludes isolated  tumor cells): 0       Number of Nodes with Isolated Tumor Cells (0.2 mm or less): 0       Number of Lymph Nodes Examined: 1  Distant Metastasis:       Distant Site(s) Involved: Not applicable  Pathologic Stage Classification (pTNM, AJCC 8th Edition): ypT3a, ypN0  Ancillary Studies: Can be performed upon request  Representative Tumor Block: A7  Comment(s): Given extensive involvement and neoadjuvant therapy it is difficult to determine the exact primary location, but the right ovary is slightly favored. Dr. Portia Brittle  has reviewed the case.    07/02/2021 Surgery   Date of Service: 07/02/21   Preoperative Diagnosis: Malignancy presumed to be of gyn origin based on IHC, lack of clinical response to NACT   Postoperative Diagnosis: Stage IIIC presumed ovarian cancer    Procedure(s) Performed: Exploratory laparotomy with total hysterectomy bilateral salpingo-oophorectomy, total abdominal hysterectomy, tumor debulking including omentectomy and excision of several tumor plaques versus treated disease, oversew of bladder peritoneum   Surgeon: Brinton Canavan, MD Specimens: Uterus Cervix, Bilateral tubes / ovaries, peritoneal plaques (sent with uterus and cervix), omentum   Estimated Blood Loss: 250 mL.     Ascites: approximately 2L upon entry and produced during surgery Operative Findings: Small mobile uterus, nodular masses filling the cul de sac. On intra-abdominal entry, approximately 1.6L of green-tinged ascites was encountered. Normal liver edge, diaphragm, and stomach. Infracolic omentum with a 6x4cm nodular area and several smaller (1-3cm) deposits c/w tumor. Otherwise, omentum without evidence of disease. Small fibrinous debris on the small bowel, no obvious tumor implants. Mesentery free of disease. No appreciable adenopathy. Bilateral ovaries replaced with vesicular tumors (L>R), measuring approximately 8cm and 12cm. Several areas of plaque (appeared more consistent with treated tumor than active cancer - right pelvic sidewall, posterior cul de sac, and anterior cul de sac) noted, most  excised. Uterus 4-6cm in size. Dense adhesions of the bladder to the LUS and cervix, likely combination of prior c-section and treatment effect. R0 resection at the end of surgery if peritoneal plaques not c/w tumor. No clear umbilical hernia.    07/17/2021 Tumor Marker   Patient's tumor was tested for the following markers: CA-125. Results of the tumor marker test revealed 116.   08/06/2021 Genetic Testing   Negative genetic testing:  no pathogenic variants detected in [facility] TumorNext-HRD + CancerNext Panel.  The report date is August 06, 2021.    The CancerNext gene panel offered by [employer] includes sequencing and rearrangement analysis for the following 37 genes:   APC, ATM, AXIN2 BARD1, BMPR1A, BRCA1, BRCA2, BRIP1, CDH1, CDK4, CDKN2A, CHEK2, DICER1, HOXB13, EPCAM, GREM1, MLH1, MRE11A, MSH2, MSH3, MSH6, MUTYH, NBN, NF1, PALB2, PMS2, POLD1, POLE, PTEN, RAD50, RAD51C, RAD51D, RECQL, SMAD4, SMARCA4, STK11, and TP53. TumorNext-HRD is a paired tumor and germline analysis of BRCA1 and BRCA2 plus 9 additional genes in the homologous recombination repair pathway (ATM, BARD1, BRCA1, BRCA2, BRIP1, CHEK2, MRE11A, NBN, PALB2, RAD51C, RAD51D).     Interval History: Doing well.  Denies any abdominal or pelvic pain.  Reports baseline bowel bladder function.  Past Medical/Surgical History: Past Medical History:  Diagnosis Date   Allergy 1964   Anemia    Bronchitis    Cancer (HCC)    Complication of anesthesia    Endometriosis    Family history of renal cancer 05/09/2021   History of anemia    Migraines    Osteoporosis    PONV (postoperative nausea and vomiting)     Past Surgical History:  Procedure Laterality Date   CESAREAN SECTION  1986, 1990   and BTSP   COLONOSCOPY     DEBULKING N/A 07/02/2021   Procedure: TUMOR DEBULKING;  Surgeon: Suzi Essex, MD;  Location: [facility];  Service: Gynecology;  Laterality: N/A;   FRACTURE SURGERY  Sept 2,2021   HYSTERECTOMY ABDOMINAL WITH SALPINGO-OOPHORECTOMY N/A 07/02/2021   Procedure: HYSTERECTOMY ABDOMINAL WITH BILATERAL SALPINGO-OOPHORECTOMY; OMENTECTOMY;  Surgeon: Suzi Essex, MD;  Location: [facility];  Service: Gynecology;  Laterality: N/A;   LAPAROSCOPY     for infertility-endometriosis   LAPAROSCOPY     2nd for infertility   mole removed     precancerous mole right upper abd   ORIF WRIST FRACTURE Right 08/02/2020   Procedure: Right distal radius open  reduction internal fixation;  Surgeon: Oralia Bills, MD;  Location: [facility];  Service: Orthopedics;  Laterality: Right;    TONSILLECTOMY AND ADENOIDECTOMY     TUBAL LIGATION  O7/28/1989    Family History  Problem Relation Age of Onset   Renal cancer Father 92       Agent Orange exposure   Cancer Father    Hypertension Sister    Hypothyroidism Sister    Other Sister        Wegener's syndrome   Hypertension Brother    Diabetes Maternal Grandmother    Heart attack Paternal Grandfather    Osteoporosis Other    Colon cancer Neg Hx    Breast cancer Neg Hx    Uterine cancer Neg Hx    Ovarian cancer Neg Hx    Endometrial cancer Neg Hx    Prostate cancer Neg Hx    Pancreatic cancer Neg Hx     Social History   Socioeconomic History   Marital status: Married    Spouse name: Gwynn Lesches   Number of  children: 2   Years of education: Not on file   Highest education level: Not on file  Occupational History   Occupation: retired  Tobacco Use   Smoking status: Former    Current packs/day: 0.00    Average packs/day: 1 pack/day for 14.0 years (14.0 ttl pk-yrs)    Types: Cigarettes    Start date: 12/01/1969    Quit date: 12/02/1983    Years since quitting: 40.4   Smokeless tobacco: Never   Tobacco comments:    I have not smoked in 37 years.  Vaping Use   Vaping status: Never Used  Substance and Sexual Activity   Alcohol use: Yes    Comment: Maybe 2 a month.   Drug use: No   Sexual activity: Yes    Partners: Male    Birth control/protection: Post-menopausal, Surgical    Comment: BTL  Other Topics Concern   Not on file  Social History Narrative   Not on file   Social Drivers of Health   Financial Resource Strain: Not on file  Food Insecurity: No Food Insecurity (05/10/2024)   Hunger Vital Sign    Worried About Running Out of Food in the Last Year: Never true    Ran Out of Food in the Last Year: Never true  Transportation Needs: No Transportation Needs (05/10/2024)    PRAPARE - Administrator, Civil Service (Medical): No    Lack of Transportation (Non-Medical): No  Physical Activity: Sufficiently Active (08/12/2021)   Exercise Vital Sign    Days of Exercise per Week: 4 days    Minutes of Exercise per Session: 60 min  Stress: No Stress Concern Present (08/12/2021)   [employer] of Occupational Health - Occupational Stress Questionnaire    Feeling of Stress : Not at all  Social Connections: Socially Integrated (08/12/2021)   Social Connection and Isolation Panel    Frequency of Communication with Friends and Family: More than three times a week    Frequency of Social Gatherings with Friends and Family: More than three times a week    Attends Religious Services: More than 4 times per year    Active Member of [employer] or Organizations: Yes    Attends Engineer, structural: More than 4 times per year    Marital Status: Married    Current Medications:  Current Outpatient Medications:    acetaminophen  (TYLENOL ) 500 MG tablet, Take 500 mg by mouth every 6 (six) hours as needed for moderate pain or headache., Disp: , Rfl:    Boswellia-Glucosamine-Vit D (OSTEO BI-FLEX ONE PER DAY PO), Take by mouth daily., Disp: , Rfl:    Calcium Carbonate-Vit D-Min (CALTRATE 600+D PLUS MINERALS PO), , Disp: , Rfl:    EPINEPHrine  0.3 mg/0.3 mL IJ SOAJ injection, Inject 0.3 mg into the muscle as needed (allergic reaction)., Disp: 2 each, Rfl: 6   ibuprofen  (ADVIL ) 600 MG tablet, Take 1 tablet (600 mg total) by mouth every 6 (six) hours as needed for moderate pain. For AFTER surgery, Disp: 30 tablet, Rfl: 0   magnesium 30 MG tablet, Take 30 mg by mouth 2 (two) times daily., Disp: , Rfl:    Multiple Vitamins-Minerals (ADULT GUMMY PO), Take 2 capsules by mouth daily., Disp: , Rfl:    OVER THE COUNTER MEDICATION, Take 100 mg by mouth daily. Natures Made Collagen Gummy, Disp: , Rfl:    OVER THE COUNTER MEDICATION, Take 500 mcg by mouth daily at 2 PM. Superior  Source K-2, Disp: ,  Rfl:    polyethylene glycol (MIRALAX / GLYCOLAX) 17 g packet, Take 17 g by mouth daily., Disp: , Rfl:    senna-docusate (SENOKOT-S) 8.6-50 MG tablet, Take 2 tablets by mouth at bedtime. For AFTER surgery, do not take if having diarrhea, Disp: 30 tablet, Rfl: 0   SUMAtriptan  (IMITREX ) 100 MG tablet, 1 tab with headache onset.  Can repeat in 2 hours.  Max dosage 200mg /24 hours., Disp: 36 tablet, Rfl: 4   tamoxifen  (NOLVADEX ) 20 MG tablet, Take 1 tablet (20 mg total) by mouth daily., Disp: 30 tablet, Rfl: 12  Review of Systems: Denies appetite changes, fevers, chills, fatigue, unexplained weight changes. Denies hearing loss, neck lumps or masses, mouth sores, ringing in ears or voice changes. Denies cough or wheezing.  Denies shortness of breath. Denies chest pain or palpitations. Denies leg swelling. Denies abdominal distention, pain, blood in stools, constipation, diarrhea, nausea, vomiting, or early satiety. Denies pain with intercourse, dysuria, frequency, hematuria or incontinence. Denies hot flashes, pelvic pain, vaginal bleeding or vaginal discharge.   Denies joint pain, back pain or muscle pain/cramps. Denies itching, rash, or wounds. Denies dizziness, headaches, numbness or seizures. Denies swollen lymph nodes or glands, denies easy bruising or bleeding. Denies anxiety, depression, confusion, or decreased concentration.  Physical Exam: BP 109/71 (BP Location: Left Arm, Patient Position: Sitting)   Pulse 62   Temp 97.6 F (36.4 C) (Tympanic)   Resp 16   Ht 5' 4.96 (1.65 m)   Wt 139 lb 3.2 oz (63.1 kg)   LMP 10/31/2008   SpO2 100%   BMI 23.19 kg/m  General: Alert, oriented, no acute distress. HEENT: Normocephalic, atraumatic, sclera anicteric. Chest: Unlabored breathing on room air.  Lungs are clear to auscultation bilaterally, no wheezes or rhonchi. Cardiovascular: Regular rate and rhythm, no murmurs, rubs, or gallops appreciated. Abdomen: soft,  nontender.  Normoactive bowel sounds.  No masses or hepatosplenomegaly appreciated.  Well-healed scar. Extremities: Grossly normal range of motion.  Warm, well perfused.  No edema bilaterally. Skin: No rashes or lesions noted. Lymphatics: No cervical, supraclavicular, or inguinal adenopathy. GU: Normal appearing external genitalia without erythema, excoriation, or lesions.  Speculum exam reveals mildly atrophic vaginal mucosa, no lesions or masses, minimal physiologic discharge.  Bimanual exam reveals cuff intact, no nodularity or masses.  Rectovaginal exam confirms these findings.  Laboratory & Radiologic Studies: Component Ref Range & Units (hover) 5 mo ago (12/11/23) 8 mo ago (08/28/23) 11 mo ago (05/28/23) 1 yr ago (02/20/23) 1 yr ago (10/28/22) 1 yr ago (07/21/22) 2 yr ago (04/25/22)  Cancer Antigen (CA) 125 12.2 11.3 CM 10.6 CM 11.8 CM 13.9 CM 10.5 CM 10.7   Assessment & Plan: Karen Winters is a 73 y.o. 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 Surgery in 07/2021. Genetic testing shows no somatic or germline mutations. On Tamoxifen  maintenance.   Patient continues to do well and is NED on exam.  CA-125 drawn today.   She takes calcium and vitamin D  supplementation.  Based on last bone scan at the end of the 2023, we elected to stop letrozole  and start tamoxifen . She is doing very well on Tamoxifen . Next bone density due at end of 2025.   Per NCCN guidelines, she previously transitioned to visits every 3-6 months.  Next visit will be in 5 months. We reviewed signs and symptoms that should  prompt a phone call to be seen sooner than that visit.  20 minutes of total time was spent for this patient encounter, including preparation, face-to-face counseling with the patient and coordination of care, and documentation of the  encounter.  Wiley Hanger, MD  Division of Gynecologic Oncology  Department of Obstetrics and Gynecology  [facility]

## 2024-05-13 ENCOUNTER — Ambulatory Visit: Payer: Self-pay | Admitting: Gynecologic Oncology

## 2024-05-13 LAB — CA 125: Cancer Antigen (CA) 125: 13.6 U/mL (ref 0.0–38.1)

## 2024-06-06 ENCOUNTER — Ambulatory Visit: Admitting: Nurse Practitioner

## 2024-06-06 ENCOUNTER — Encounter: Payer: Self-pay | Admitting: Nurse Practitioner

## 2024-06-06 VITALS — BP 134/82 | HR 68 | Ht 64.25 in | Wt 137.6 lb

## 2024-06-06 DIAGNOSIS — G43409 Hemiplegic migraine, not intractable, without status migrainosus: Secondary | ICD-10-CM | POA: Diagnosis not present

## 2024-06-06 DIAGNOSIS — Z0000 Encounter for general adult medical examination without abnormal findings: Secondary | ICD-10-CM

## 2024-06-06 DIAGNOSIS — T6391XS Toxic effect of contact with unspecified venomous animal, accidental (unintentional), sequela: Secondary | ICD-10-CM | POA: Diagnosis not present

## 2024-06-06 DIAGNOSIS — C563 Malignant neoplasm of bilateral ovaries: Secondary | ICD-10-CM

## 2024-06-06 LAB — LIPID PANEL

## 2024-06-06 MED ORDER — EPINEPHRINE 0.3 MG/0.3ML IJ SOAJ
0.3000 mg | INTRAMUSCULAR | 6 refills | Status: DC | PRN
Start: 2024-06-06 — End: 2024-06-10

## 2024-06-06 NOTE — Patient Instructions (Signed)
  Ms. Vickroy , Thank you for taking time to come for your Medicare Wellness Visit. I appreciate your ongoing commitment to your health goals. Please review the following plan we discussed and let me know if I can assist you in the future.   These are the goals we discussed:  Goals      Patient Stated     Get back to 129 lbs     Patient Stated     Lose 1 pound. Continue walking 20,000 steps a day on average.         This is a list of the screening recommended for you and due dates:  Health Maintenance  Topic Date Due   COVID-19 Vaccine (4 - 2024-25 season) 08/02/2023   Flu Shot  07/01/2024   DTaP/Tdap/Td vaccine (2 - Td or Tdap) 08/15/2024   Colon Cancer Screening  01/03/2025   Medicare Annual Wellness Visit  06/06/2025   Mammogram  09/28/2025   Pneumococcal Vaccine for age over 42  Completed   DEXA scan (bone density measurement)  Completed   Hepatitis C Screening  Completed   Zoster (Shingles) Vaccine  Completed   Hepatitis B Vaccine  Aged Out   HPV Vaccine  Aged Out   Meningitis B Vaccine  Aged Out

## 2024-06-06 NOTE — Assessment & Plan Note (Signed)
 Migraines managed with Imitrex . She reports having sufficient medication and does not require a refill. - Continue current migraine management with Imitrex  as needed

## 2024-06-06 NOTE — Assessment & Plan Note (Signed)
 Cancer in remission. She is on tamoxifen  and is considering discontinuation after a bone checkup in December. She expressed apprehension about stopping due to being platinum-resistant. Her oncologist suggested trying to discontinue tamoxifen  after the bone checkup, but she feels it is a crutch. - Continue tamoxifen  until after bone checkup in December - Discuss potential discontinuation of tamoxifen  after bone checkup

## 2024-06-06 NOTE — Assessment & Plan Note (Signed)

## 2024-06-06 NOTE — Progress Notes (Signed)
 06/06/2024   Vitals:  BP 134/82   Pulse 68   Ht 5' 4.25 (1.632 m)   Wt 137 lb 9.6 oz (62.4 kg)   LMP 10/31/2008   BMI 23.44 kg/m   Body mass index is 23.44 kg/m. Karen Winters is a 73 y.o. female who presents for Subsequent Medicare Annual Wellness Exam  Care Team Members: Current Providers as of 06/06/2024 PCP: Oris Camie BRAVO, NP Encounter Provider: Oris Camie BRAVO, NP, starting on Mon Jun 06, 2024 12:00 AM Referring Provider: Oris Camie BRAVO, NP, starting on Mon Jun 06, 2024 12:00 AM Attending Provider: Oris Camie BRAVO, NP, starting on Mon Feb 22, 2024  9:29 AM (Active)   Method of visit:  in person In the event virtual visit conducted, the patient consented to a virtual visit. Patient consented to have virtual visit and was identified by two identifiers.  Encounter participants: Patient: Karen Winters - located [facility] Patient Visit Location: In Office Nurse/Provider: Camie CHARLENA Oris - located Virtual Visit Location Provider: Office/Clinic Others (if applicable): patient only  History of Present Illness Karen Winters is a 73 year old female who presents for a routine annual exam.   She maintains an active lifestyle, averaging over twenty thousand steps a day and recently ran ten miles. She has traveled to [place] and [place] and plans to visit [place]  next year. Despite her activity level, she notes a slight weight loss of two pounds.  She experienced constipation during her recent trip, which has since resolved. She attributes this to a possible change in water  or environment.  She has a history of cancer and is currently on tamoxifen . Her recent lab results have been normal. She is platinum-resistant and follows a five-month follow-up schedule.  She has experienced multiple episodes of shingles despite having received the shingles vaccine. The episodes have become less frequent and severe over time, with the last occurrence three months ago.  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 She maintains contact with several individuals she has mentored, providing support and encouragement.  Review of Systems:  Neuro: Denies difficulty remembering daily tasks, people, or places.  Ear: Denies difficulty hearing or need to increase volume on television or telephone to hear Eye: Denies visual changes, difficulty reading normal print, or visual field deficits. Cardiac: Denies chest pain, palpitations, dizziness, shortness of breath, pain in lower extremities, or night time waking with shortness of breath. Lung: Denies shortness of breath, difficulty breathing, chronic cough, or dizziness.  GI: Denies changes in bowel habits, blood in stool, difficulty passing stool, decreased intake of food or drink, nausea, or vomiting.  GU: Denies changes in urinary habits, dark urine, malodorous urine, increased or decreased urination, or urinary incontinence.  MSK: Denies weakness in extremities, difficulty walking, difficulty grasping, or new MSK pain.  Skin: Denies changes to the skin, fragile skin, or increased bruising.  Constitution: Denies fatigue, weakness, or confusion.   Patient rating of health: same as this time last year  Clinical Intake: Pre-visit preparation completed: Yes  Pain : No/denies pain     BMI - recorded: 23.44 Nutritional Status: BMI of 19-24  Normal Nutritional Risks: None Diabetes: No  Activities of Daily Living: Independent Ambulation: Independent Medication Administration: Independent Home Management: Independent  Barriers to Care Management & Learning: None  Do you feel unsafe in your current relationship?: No Do you feel physically threatened by others?: No Anyone hurting  you at home, work, or school?: No Unable to ask?: No  How often do you need to have someone help you when you read instructions, pamphlets, or other written materials from your doctor or  pharmacy?: 1 - Never  Interpreter Needed?: No  Information entered by :: S Khristopher Kapaun     06/06/2024    9:28 AM 05/26/2023    2:47 PM 04/14/2023    8:35 AM 04/18/2022    3:34 PM 10/09/2021   10:12 AM 07/02/2021    9:03 AM 06/25/2021   10:08 AM  Advanced Directives  Does Patient Have a Medical Advance Directive? Yes Yes Yes Yes Yes Yes Yes  Type of Estate agent of [place];Living will Healthcare Power of [place];Living will Healthcare Power of [place];Living will Living will Living will Living will;Healthcare Power of Attorney Living will;Healthcare Power of Attorney  Does patient want to make changes to medical advance directive? No - Patient declined  Yes (ED - Information included in AVS) No - Patient declined No - Guardian declined    Copy of Healthcare Power of Attorney in Chart? No - copy requested  No - copy requested   No - copy requested     Social Determinants of Health SDOH Screenings   Food Insecurity: No Food Insecurity (06/02/2024)  Housing: Low Risk  (06/02/2024)  Transportation Needs: No Transportation Needs (06/02/2024)  Utilities: Not At Risk (05/10/2024)  Alcohol Screen: Low Risk  (08/12/2021)  Depression (PHQ2-9): Low Risk  (06/06/2024)  Financial Resource Strain: Low Risk  (06/02/2024)  Physical Activity: Sufficiently Active (06/02/2024)  Social Connections: Socially Integrated (06/02/2024)  Stress: No Stress Concern Present (06/02/2024)  Tobacco Use: Medium Risk (06/06/2024)     Functional Status Survey: Is the patient deaf or have difficulty hearing?: No Does the patient have difficulty seeing, even when wearing glasses/contacts?: No Does the patient have difficulty concentrating, remembering, or making decisions?: No Does the patient have difficulty walking or climbing stairs?: No Does the patient have difficulty dressing or bathing?: No Does the patient have difficulty doing errands alone such as visiting a doctor's office or shopping?: No   Annual Goal:   Goals      Patient Stated     Get back to 129 lbs     Patient Stated     Lose 1 pound. Continue walking 20,000 steps a day on average.          Advanced Directives <no information>     Fall Risk    06/06/2024    9:26 AM 04/14/2023    8:33 AM 03/24/2022   10:17 AM 08/12/2021   11:21 AM  Fall Risk   Falls in the past year? 0 0 0 0  Number falls in past yr: 0 0 0 0  Injury with Fall? 0 0 0 0  Risk for fall due to : No Fall Risks No Fall Risks No Fall Risks No Fall Risks  Follow up Falls evaluation completed Falls evaluation completed Falls evaluation completed;Education provided  Falls evaluation completed;Falls prevention discussed      Data saved with a previous flowsheet row definition   Medicare Risk  Medicare Risk at Home - 06/06/24 0929     Any stairs in or around the home? Yes    If so, are there any without handrails? No    Home free of loose throw rugs in walkways, pet beds, electrical cords, etc? No    Adequate lighting in your home to reduce risk of falls? Yes  Life alert? No    Use of a cane, walker or w/c? No    Grab bars in the bathroom? Yes    Shower chair or bench in shower? Yes    Elevated toilet seat or a handicapped toilet? No           Cognitive Function Normal: Yes Exam Completed:         Mini-Cog - 06/06/24 0927     Normal clock drawing test? yes    How many words correct? 3          Depression Screening    06/06/2024    9:27 AM 05/06/2023    8:26 AM 04/14/2023    8:33 AM 03/24/2022   10:17 AM 08/12/2021   11:22 AM  Depression screen PHQ 2/9  Decreased Interest 0 0 0 0 0  Down, Depressed, Hopeless 0 0 0 0 0  PHQ - 2 Score 0 0 0 0 0  Altered sleeping     0  Tired, decreased energy     0  Change in appetite     0  Feeling bad or failure about yourself      0  Trouble concentrating     0  Moving slowly or fidgety/restless     0  Suicidal thoughts     0  PHQ-9 Score     0     Activities of Daily Living    06/06/2024    9:29  AM  In your present state of health, do you have any difficulty performing the following activities:  Hearing? 0  Vision? 0  Difficulty concentrating or making decisions? 0  Walking or climbing stairs? 0  Dressing or bathing? 0  Doing errands, shopping? 0  Preparing Food and eating ? N  Using the Toilet? N  In the past six months, have you accidently leaked urine? N  Do you have problems with loss of bowel control? N  Managing your Medications? N  Managing your Finances? N  Housekeeping or managing your Housekeeping? N    Tobacco Social History   Tobacco Use  Smoking Status Former   Current packs/day: 0.00   Average packs/day: 1 pack/day for 14.0 years (14.0 ttl pk-yrs)   Types: Cigarettes   Start date: 12/01/1969   Quit date: 12/02/1983   Years since quitting: 40.5  Smokeless Tobacco Never  Tobacco Comments   I have not smoked in 37 years.     Counseling given: Not Answered Tobacco comments: I have not smoked in 37 years.   Hospitalizations in the Past Year: none  ED Visits in the Past Year: No  Surgeries in the Past Year: No   History    Medication List Current Meds  Medication Sig   acetaminophen  (TYLENOL ) 500 MG tablet Take 500 mg by mouth every 6 (six) hours as needed for moderate pain or headache.   Boswellia-Glucosamine-Vit D (OSTEO BI-FLEX ONE PER DAY PO) Take by mouth daily.   Calcium Carbonate-Vit D-Min (CALTRATE 600+D PLUS MINERALS PO)    ibuprofen  (ADVIL ) 600 MG tablet Take 1 tablet (600 mg total) by mouth every 6 (six) hours as needed for moderate pain. For AFTER surgery   magnesium 30 MG tablet Take 30 mg by mouth 2 (two) times daily.   Multiple Vitamins-Minerals (ADULT GUMMY PO) Take 2 capsules by mouth daily.   OVER THE COUNTER MEDICATION Take 100 mg by mouth daily. Natures Made Collagen Gummy   OVER THE COUNTER MEDICATION Take 500 mcg by  mouth daily at 2 PM. Superior Source K-2   polyethylene glycol (MIRALAX / GLYCOLAX) 17 g packet Take 17 g by  mouth daily.   senna-docusate (SENOKOT-S) 8.6-50 MG tablet Take 2 tablets by mouth at bedtime. For AFTER surgery, do not take if having diarrhea   SUMAtriptan  (IMITREX ) 100 MG tablet 1 tab with headache onset.  Can repeat in 2 hours.  Max dosage 200mg /24 hours.   tamoxifen  (NOLVADEX ) 20 MG tablet Take 1 tablet (20 mg total) by mouth daily.   [DISCONTINUED] EPINEPHrine  0.3 mg/0.3 mL IJ SOAJ injection Inject 0.3 mg into the muscle as needed (allergic reaction).     Immunizations Immunization History  Administered Date(s) Administered   Fluad Quad(high Dose 65+) 09/29/2019   Fluzone Influenza virus vaccine,trivalent (IIV3), split virus 09/07/2017   Influenza Inj Mdck Quad Pf 10/07/2018   Influenza, High Dose Seasonal PF 09/29/2019   Influenza,inj,Quad PF,6+ Mos 10/07/2018   Influenza-Unspecified 10/07/2018, 09/07/2022   PFIZER Comirnaty(Gray Top)Covid-19 Tri-Sucrose Vaccine 10/09/2020   PFIZER(Purple Top)SARS-COV-2 Vaccination 12/22/2019, 01/12/2020   Pneumococcal Conjugate-13 09/20/2019   Pneumococcal Polysaccharide-23 09/21/2020   Tdap 08/15/2014   Zoster Recombinant(Shingrix) 04/15/2023, 05/18/2023     Screening Tests Health Maintenance  Topic Date Due   COVID-19 Vaccine (4 - 2024-25 season) 08/02/2023   INFLUENZA VACCINE  07/01/2024   DTaP/Tdap/Td (2 - Td or Tdap) 08/15/2024   Colonoscopy  01/03/2025   Medicare Annual Wellness (AWV)  06/06/2025   MAMMOGRAM  09/28/2025   Pneumococcal Vaccine: 50+ Years  Completed   DEXA SCAN  Completed   Hepatitis C Screening  Completed   Zoster Vaccines- Shingrix  Completed   Hepatitis B Vaccines  Aged Out   HPV VACCINES  Aged Out   Meningococcal B Vaccine  Aged Out    Health Maintenance Screenings  Health Maintenance Topics with due status: Overdue     Topic Date Due   COVID-19 Vaccine 08/02/2023   Past Medical History:  Diagnosis Date   Allergy 1962   Anemia    Bilateral hip pain 04/14/2023   Bronchitis    Cancer (HCC)     Change in bowel habits 04/11/2021   Complication of anesthesia    Endometriosis    Family history of renal cancer 05/09/2021   History of anemia    Leukocytosis 05/14/2021   Migraines    Osteoporosis    Ovarian cancer (HCC) 04/09/2021   PONV (postoperative nausea and vomiting)    Screening for osteoporosis 08/12/2021   Past Surgical History:  Procedure Laterality Date   CESAREAN SECTION  1986, 1990   and BTSP   COLONOSCOPY     DEBULKING N/A 07/02/2021   Procedure: TUMOR DEBULKING;  Surgeon: Viktoria Comer SAUNDERS, MD;  Location: [facility];  Service: Gynecology;  Laterality: N/A;   FRACTURE SURGERY  Sept 2,2021   HYSTERECTOMY ABDOMINAL WITH SALPINGO-OOPHORECTOMY N/A 07/02/2021   Procedure: HYSTERECTOMY ABDOMINAL WITH BILATERAL SALPINGO-OOPHORECTOMY; OMENTECTOMY;  Surgeon: Viktoria Comer SAUNDERS, MD;  Location: [facility];  Service: Gynecology;  Laterality: N/A;   LAPAROSCOPY     for infertility-endometriosis   LAPAROSCOPY     2nd for infertility   mole removed     precancerous mole right upper abd   ORIF WRIST FRACTURE Right 08/02/2020   Procedure: Right distal radius open reduction internal fixation;  Surgeon: Carolee Lynwood JINNY DOUGLAS, MD;  Location: [facility];  Service: Orthopedics;  Laterality: Right;    TONSILLECTOMY AND ADENOIDECTOMY     TUBAL LIGATION  O7/28/1989   Family History  Problem Relation Age of Onset   Renal cancer Father 15       Agent Orange exposure   Cancer Father    Hypertension Sister    Hypothyroidism Sister    Other Sister        Wegener's syndrome   Hypertension Brother    Diabetes Maternal Grandmother    Heart attack Paternal Grandfather    Osteoporosis Other    Colon cancer Neg Hx    Breast cancer Neg Hx    Uterine cancer Neg Hx    Ovarian cancer Neg Hx    Endometrial cancer Neg Hx    Prostate cancer Neg Hx    Pancreatic cancer Neg Hx    Social History   Socioeconomic History   Marital status: Married    Spouse name: Phil   Number of children: 2    Years of education: Not on file   Highest education level: Master's degree (e.g., MA, MS, MEng, MEd, MSW, MBA)  Occupational History   Occupation: retired  Tobacco Use   Smoking status: Former    Current packs/day: 0.00    Average packs/day: 1 pack/day for 14.0 years (14.0 ttl pk-yrs)    Types: Cigarettes    Start date: 12/01/1969    Quit date: 12/02/1983    Years since quitting: 40.5   Smokeless tobacco: Never   Tobacco comments:    I have not smoked in 37 years.  Vaping Use   Vaping status: Never Used  Substance and Sexual Activity   Alcohol use: Not Currently    Comment: Maybe 2 a month.   Drug use: No   Sexual activity: Yes    Partners: Male    Birth control/protection: Post-menopausal, Surgical    Comment: BTL  Other Topics Concern   Not on file  Social History Narrative   Not on file   Social Drivers of Health   Financial Resource Strain: Low Risk  (06/02/2024)   Overall Financial Resource Strain (CARDIA)    Difficulty of Paying Living Expenses: Not hard at all  Food Insecurity: No Food Insecurity (06/02/2024)   Hunger Vital Sign    Worried About Running Out of Food in the Last Year: Never true    Ran Out of Food in the Last Year: Never true  Transportation Needs: No Transportation Needs (06/02/2024)   PRAPARE - Administrator, Civil Service (Medical): No    Lack of Transportation (Non-Medical): No  Physical Activity: Sufficiently Active (06/02/2024)   Exercise Vital Sign    Days of Exercise per Week: 5 days    Minutes of Exercise per Session: 130 min  Stress: No Stress Concern Present (06/02/2024)   [employer] of Occupational Health - Occupational Stress Questionnaire    Feeling of Stress: Not at all  Social Connections: Socially Integrated (06/02/2024)   Social Connection and Isolation Panel    Frequency of Communication with Friends and Family: More than three times a week    Frequency of Social Gatherings with Friends and Family: More than three  times a week    Attends Religious Services: More than 4 times per year    Active Member of [employer] or Organizations: Yes    Attends Banker Meetings: More than 4 times per year    Marital Status: Married    Outpatient Encounter Medications as of 06/06/2024  Medication Sig   acetaminophen  (TYLENOL ) 500 MG tablet Take 500 mg by mouth every 6 (six) hours as needed for moderate pain  or headache.   Boswellia-Glucosamine-Vit D (OSTEO BI-FLEX ONE PER DAY PO) Take by mouth daily.   Calcium Carbonate-Vit D-Min (CALTRATE 600+D PLUS MINERALS PO)    ibuprofen  (ADVIL ) 600 MG tablet Take 1 tablet (600 mg total) by mouth every 6 (six) hours as needed for moderate pain. For AFTER surgery   magnesium 30 MG tablet Take 30 mg by mouth 2 (two) times daily.   Multiple Vitamins-Minerals (ADULT GUMMY PO) Take 2 capsules by mouth daily.   OVER THE COUNTER MEDICATION Take 100 mg by mouth daily. Natures Made Collagen Gummy   OVER THE COUNTER MEDICATION Take 500 mcg by mouth daily at 2 PM. Superior Source K-2   polyethylene glycol (MIRALAX / GLYCOLAX) 17 g packet Take 17 g by mouth daily.   senna-docusate (SENOKOT-S) 8.6-50 MG tablet Take 2 tablets by mouth at bedtime. For AFTER surgery, do not take if having diarrhea   SUMAtriptan  (IMITREX ) 100 MG tablet 1 tab with headache onset.  Can repeat in 2 hours.  Max dosage 200mg /24 hours.   tamoxifen  (NOLVADEX ) 20 MG tablet Take 1 tablet (20 mg total) by mouth daily.   [DISCONTINUED] EPINEPHrine  0.3 mg/0.3 mL IJ SOAJ injection Inject 0.3 mg into the muscle as needed (allergic reaction).   EPINEPHrine  0.3 mg/0.3 mL IJ SOAJ injection Inject 0.3 mg into the muscle as needed for anaphylaxis.   No facility-administered encounter medications on file as of 06/06/2024.    Physical Exam: Yes-completed Physical Exam HENT: PERRL, conjunctiva normal. Normocephalic. TM normal bilaterally. Nasal passages normal. Mouth moist and clear. Eyes, ears, and throat normal. CV: Heart  rate and rhythm normal. S1 and S2 auscultated with no extrasystoles. No murmur or rubs present. Radial and pedal pulses intact and regular. No carotid bruit. No LE edema. Capillary refill 2 to 3 seconds. Heart sounds normal. PULM: Respirations even and unlabored. Lungs clear bilaterally in all fields. No wheezing. NECK: No cervical adenopathy present. Thyroid normal. No carotid bruits. GI: Soft. Bowel sounds normal and present in all 4 quadrants. No distention, tenderness, guarding, or rebound noted. MSK: ROM normal. No visible deformities or swelling. No tenderness. SKIN: Warm, dry, intact. No rashes or concerning lesions present. NEURO: Alert and oriented x 4. No weakness or sensory deficits noted. Coordination and gait intact. PSYCH: Mood and affect normal. Cooperative and pleasant.  PLAN  Exercise Activities and Dietary Recommendations - choose a type of activity I enjoy such as biking, gardening, team sports, walking - keep track of how long I exercise - keep track of how often I exercise Regular diet  Fall Prevention - always use handrails on the stairs - always wear shoes or slippers with non-slip sole - get at least 10 minutes of activity every day - keep cell phone with me always - remove, or use a non-slip pad, with my throw rugs  Orders Placed This Encounter  Procedures   CBC with Differential/Platelet   CMP14+EGFR   Hemoglobin A1c   Lipid panel   TSH     I have personally reviewed and noted the following in the patient's chart:   Medical and social history Use of alcohol, tobacco or illicit drugs  Current medications and supplements Functional ability and status Nutritional status Physical activity Advanced directives List of other physicians Hospitalizations, surgeries, and ER visits in previous 12 months Vitals Screenings to include cognitive, depression, and falls Referrals and appointments  In addition, I have reviewed and discussed with patient certain  preventive protocols, quality metrics, and best practice recommendations.  A written personalized care plan for preventive services as well as general preventive health recommendations were provided to patient.   Camie CHARLENA Doing, NP  06/06/2024

## 2024-06-06 NOTE — Assessment & Plan Note (Signed)
 Refill for Epi-pen needed for upcoming trip to [place] . No concerns at this time.

## 2024-06-07 LAB — LIPID PANEL
Cholesterol, Total: 199 mg/dL (ref 100–199)
HDL: 61 mg/dL (ref >39)
LDL CALC COMMENT:: 3.3 ratio (ref 0.0–4.4)
LDL Chol Calc (NIH): 117 mg/dL — AB (ref 0–99)
Triglycerides: 116 mg/dL (ref 0–149)
VLDL Cholesterol Cal: 21 mg/dL (ref 5–40)

## 2024-06-07 LAB — CBC WITH DIFFERENTIAL/PLATELET
Basophils Absolute: 0 x10E3/uL (ref 0.0–0.2)
Basos: 0 %
EOS (ABSOLUTE): 0.1 x10E3/uL (ref 0.0–0.4)
Eos: 1 %
Hematocrit: 44.3 % (ref 34.0–46.6)
Hemoglobin: 14 g/dL (ref 11.1–15.9)
Immature Grans (Abs): 0 x10E3/uL (ref 0.0–0.1)
Immature Granulocytes: 0 %
Lymphocytes Absolute: 2.3 x10E3/uL (ref 0.7–3.1)
Lymphs: 33 %
MCH: 30.5 pg (ref 26.6–33.0)
MCHC: 31.6 g/dL (ref 31.5–35.7)
MCV: 97 fL (ref 79–97)
Monocytes Absolute: 0.4 x10E3/uL (ref 0.1–0.9)
Monocytes: 6 %
Neutrophils Absolute: 4.2 x10E3/uL (ref 1.4–7.0)
Neutrophils: 60 %
Platelets: 188 x10E3/uL (ref 150–450)
RBC: 4.59 x10E6/uL (ref 3.77–5.28)
RDW: 12.7 % (ref 11.7–15.4)
WBC: 7 x10E3/uL (ref 3.4–10.8)

## 2024-06-07 LAB — CMP14+EGFR
ALT: 15 IU/L (ref 0–32)
AST: 25 IU/L (ref 0–40)
Albumin: 4 g/dL (ref 3.9–4.9)
Alkaline Phosphatase: 74 IU/L (ref 44–121)
BUN/Creatinine Ratio: 19 (ref 12–28)
BUN: 17 mg/dL (ref 8–27)
Bilirubin Total: 0.3 mg/dL (ref 0.0–1.2)
CO2: 20 mmol/L (ref 20–29)
Calcium: 9.5 mg/dL (ref 8.7–10.3)
Chloride: 109 mmol/L — AB (ref 96–106)
Creatinine, Ser: 0.89 mg/dL (ref 0.57–1.00)
Globulin, Total: 2.4 g/dL (ref 1.5–4.5)
Glucose: 87 mg/dL (ref 70–99)
Potassium: 4.3 mmol/L (ref 3.5–5.2)
Sodium: 143 mmol/L (ref 134–144)
Total Protein: 6.4 g/dL (ref 6.0–8.5)
eGFR: 70 mL/min/1.73 (ref >59)

## 2024-06-07 LAB — HEMOGLOBIN A1C
Est. average glucose Bld gHb Est-mCnc: 111 mg/dL
Hgb A1c MFr Bld: 5.5 % (ref 4.8–5.6)

## 2024-06-07 LAB — TSH: TSH: 0.99 u[IU]/mL (ref 0.450–4.500)

## 2024-06-10 ENCOUNTER — Other Ambulatory Visit: Payer: Self-pay

## 2024-06-10 ENCOUNTER — Encounter: Payer: Self-pay | Admitting: Nurse Practitioner

## 2024-06-10 DIAGNOSIS — T6391XS Toxic effect of contact with unspecified venomous animal, accidental (unintentional), sequela: Secondary | ICD-10-CM

## 2024-06-10 MED ORDER — EPINEPHRINE 0.3 MG/0.3ML IJ SOAJ: 0.3000 mg | INTRAMUSCULAR | 1 refills | Status: AC | PRN

## 2024-06-13 ENCOUNTER — Other Ambulatory Visit (HOSPITAL_COMMUNITY): Payer: Self-pay

## 2024-06-13 ENCOUNTER — Telehealth: Payer: Self-pay

## 2024-06-13 NOTE — Telephone Encounter (Signed)
 Pharmacy Patient Advocate Encounter  Insurance verification completed.   The patient is insured through [facility]   Ran test claim for Epinephrine . Currently a quantity of 2each(1box) is a (30 day supply) and the co-pay is 30.98 . No P/A is needed at this time. The pharmacy has been contacted and a test claim has been ran.    This test claim was processed through [facility]- copay amounts may vary at other pharmacies due to pharmacy/plan contracts, or as the patient moves through the different stages of their insurance plan.

## 2024-06-14 ENCOUNTER — Other Ambulatory Visit (HOSPITAL_COMMUNITY): Payer: Self-pay

## 2024-06-22 ENCOUNTER — Ambulatory Visit: Payer: Self-pay | Admitting: Nurse Practitioner

## 2024-10-11 ENCOUNTER — Encounter: Payer: Self-pay | Admitting: Gynecologic Oncology

## 2024-10-12 ENCOUNTER — Encounter (HOSPITAL_BASED_OUTPATIENT_CLINIC_OR_DEPARTMENT_OTHER): Payer: Self-pay

## 2024-10-13 ENCOUNTER — Encounter: Payer: Self-pay | Admitting: Gynecologic Oncology

## 2024-10-13 ENCOUNTER — Inpatient Hospital Stay: Attending: Gynecologic Oncology | Admitting: Gynecologic Oncology

## 2024-10-13 ENCOUNTER — Inpatient Hospital Stay

## 2024-10-13 VITALS — BP 127/83 | HR 60 | Temp 97.9°F | Resp 20 | Wt 138.8 lb

## 2024-10-13 DIAGNOSIS — C561 Malignant neoplasm of right ovary: Secondary | ICD-10-CM | POA: Insufficient documentation

## 2024-10-13 DIAGNOSIS — Z79810 Long term (current) use of selective estrogen receptor modulators (SERMs): Secondary | ICD-10-CM | POA: Diagnosis not present

## 2024-10-13 DIAGNOSIS — Z90710 Acquired absence of both cervix and uterus: Secondary | ICD-10-CM | POA: Diagnosis not present

## 2024-10-13 DIAGNOSIS — Z9221 Personal history of antineoplastic chemotherapy: Secondary | ICD-10-CM | POA: Diagnosis not present

## 2024-10-13 DIAGNOSIS — C569 Malignant neoplasm of unspecified ovary: Secondary | ICD-10-CM

## 2024-10-13 DIAGNOSIS — M818 Other osteoporosis without current pathological fracture: Secondary | ICD-10-CM

## 2024-10-13 DIAGNOSIS — Z90722 Acquired absence of ovaries, bilateral: Secondary | ICD-10-CM | POA: Insufficient documentation

## 2024-10-13 DIAGNOSIS — Z9079 Acquired absence of other genital organ(s): Secondary | ICD-10-CM | POA: Insufficient documentation

## 2024-10-13 MED ORDER — TAMOXIFEN CITRATE 20 MG PO TABS
20.0000 mg | ORAL_TABLET | Freq: Every day | ORAL | 12 refills | Status: AC
Start: 2024-10-13 — End: ?

## 2024-10-13 NOTE — Progress Notes (Signed)
 Gynecologic Oncology Return Clinic Visit  10/13/24  Reason for Visit: surveillance  Treatment History: Oncology History Overview Note  Borderline serous carcinoma   Ovarian cancer (HCC)  04/01/2021 Initial Diagnosis   She notes that she noticed an umbilical hernia.  She was scheduled to see a surgeon about possible repair.  She also endorses intermittent pain in around the area of her right hip that she thought was secondary to her exercise routine.  Over the last week, she has had a decreased appetite.  She denies any nausea or emesis but endorses several months of abdominal bloating and early satiety.  She notes normal bowel function, which she describes as bowel movements at least daily.  She has had some increased urinary frequency and around Easter developed stress urinary incontinence when she runs.     04/04/2021 Imaging   US  pelvis  Septate uterus, otherwise unremarkable.   Normal endometrial complex.   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   Recommend further assessment by CT imaging with IV and oral contrast.   04/05/2021 Tumor Marker   Patient's tumor was tested for the following markers: CA-125 Results of the tumor marker test revealed 826.   04/05/2021 Imaging   1. Large bilateral solid-appearing adnexal masses are identified concerning for ovarian neoplasm. There is soft tissue infiltration into the surrounding peritoneal cavity which partially encases nonobstructed loops of small bowel with probable serosal involvement. 2. Moderate volume of ascites identified within the abdomen and pelvis likely secondary to peritoneal carcinomatosis. Diagnostic paracentesis may be helpful for further workup. 3. Mild increased caliber of small bowel loops with a few air-fluid levels. However, there are no signs to suggest a bowel obstruction as enteric contrast material is noted up to  the level of the hepatic flexure.   04/10/2021 Initial Diagnosis   Ovarian cancer (HCC)   04/10/2021 Procedure   Successful ultrasound-guided paracentesis yielding 1.4 liters of peritoneal fluid.   04/11/2021 Cancer Staging   Staging form: Ovary, Fallopian Tube, and Primary Peritoneal Carcinoma, AJCC 8th Edition - Clinical stage from 04/11/2021: Stage IIIC (cT3c, cN0, cM0) - Signed by Lonn Hicks, MD on 04/11/2021 Stage prefix: Initial diagnosis   04/18/2021 Tumor Marker   Patient's tumor was tested for the following markers: CA-125. Results of the tumor marker test revealed 771   04/22/2021 - 06/05/2021 Chemotherapy         05/14/2021 Tumor Marker   Patient's tumor was tested for the following markers: CA-125. Results of the tumor marker test revealed 877.   06/10/2021 Imaging   1. Today's study demonstrates a centrally stable disease when compared to prior examination from 04/05/2021, with large bilateral ovarian lesions and moderate volume of malignant ascites, as detailed above. 2. Trace right pleural effusion lying dependently.     07/02/2021 Surgery     Preoperative Diagnosis: Malignancy presumed to be of gyn origin based on IHC, lack of clinical response to NACT   Postoperative Diagnosis: Stage IIIC presumed ovarian cancer    Procedure(s) Performed: Exploratory laparotomy with total hysterectomy bilateral salpingo-oophorectomy, total abdominal hysterectomy, tumor debulking including omentectomy and excision of several tumor plaques versus treated disease, oversew of bladder peritoneum   Surgeon: Odean Dollar, MD    Specimens: Uterus Cervix, Bilateral tubes / ovaries, peritoneal plaques (sent with uterus and cervix), omentum   Estimated Blood Loss: 250 mL.     Ascites: approximately 2L upon entry and produced during surgery Operative  Findings: Small mobile uterus, nodular masses filling the cul de sac. On intra-abdominal entry, approximately 1.6L of green-tinged ascites was  encountered. Normal liver edge, diaphragm, and stomach. Infracolic omentum with a 6x4cm nodular area and several smaller (1-3cm) deposits c/w tumor. Otherwise, omentum without evidence of disease. Small fibrinous debris on the small bowel, no obvious tumor implants. Mesentery free of disease. No appreciable adenopathy. Bilateral ovaries replaced with vesicular tumors (L>R), measuring approximately 8cm and 12cm. Several areas of plaque (appeared more consistent with treated tumor than active cancer - right pelvic sidewall, posterior cul de sac, and anterior cul de sac) noted, most excised. Uterus 4-6cm in size. Dense adhesions of the bladder to the LUS and cervix, likely combination of prior c-section and treatment effect. R0 resection at the end of surgery if peritoneal plaques not c/w tumor. No clear umbilical hernia.    07/02/2021 Pathology Results   A. 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   - Surface involvement present.  - Fallopian tube involvement present, non-invasive.   B. OVARY AND FALLOPIAN TUBE, LEFT, SALPINGO OOPHORECTOMY:  - Serous borderline tumor.  - Surface involvement present, non-invasive.  - Fallopian tube involvement present, non-invasive.   C. UTERUS AND CERVIX, HYSTERECTOMY:  - Uterus:       Endometrium: Benign endometrial type polyp. Inactive endometrium.  No hyperplasia or malignancy.       Myometrium: Unremarkable. No malignancy.       Serosa: Invasive serous implant.  - Cervix: Benign squamous and endocervical mucosa. No dysplasia or  malignancy.  - Bilateral fallopian tubes: Surface involvement present.   D. OMENTUM:  - Non-invasive serous implant.  - One of one lymph nodes negative for carcinoma (0/1).   ONCOLOGY TABLE:   OVARY or FALLOPIAN TUBE or PRIMARY PERITONEUM: Resection   Procedure: Hysterectomy with bilateral salpingo-oophorectomy and omental  resection.   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  Other Tissue/ Organ Involvement: Not applicable  Largest Extrapelvic Peritoneal Focus: 2 mm  Peritoneal/Ascitic Fluid Involvement: Not applicable  Chemotherapy Response Score (CRS): Cannot be determined  Regional Lymph Nodes:       Number of Nodes with Metastasis Greater than 10 mm: 0       Number of Nodes with Metastasis 10 mm or Less (excludes isolated  tumor cells): 0       Number of Nodes with Isolated Tumor Cells (0.2 mm or less): 0       Number of Lymph Nodes Examined: 1  Distant Metastasis:       Distant Site(s) Involved: Not applicable  Pathologic Stage Classification (pTNM, AJCC 8th Edition): ypT3a, ypN0  Ancillary Studies: Can be performed upon request  Representative Tumor Block: A7  Comment(s): Given extensive involvement and neoadjuvant therapy it is  difficult to determine the exact primary location, but the right ovary  is slightly favored. Dr. Belvie has reviewed the case.    07/02/2021 Pathology Results   FINAL MICROSCOPIC DIAGNOSIS:   A. 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   -  Surface involvement present.  - Fallopian tube involvement present, non-invasive.   B. OVARY AND FALLOPIAN TUBE, LEFT, SALPINGO OOPHORECTOMY:  - Serous borderline tumor.  - Surface involvement present, non-invasive.  - Fallopian tube involvement present, non-invasive.   C. UTERUS AND CERVIX, HYSTERECTOMY:  - Uterus:       Endometrium:  Benign endometrial type polyp. Inactive endometrium.  No hyperplasia or malignancy.       Myometrium: Unremarkable. No malignancy.       Serosa: Invasive serous implant.  - Cervix: Benign squamous and endocervical mucosa. No dysplasia or malignancy.  - Bilateral fallopian tubes: Surface involvement present.   D. OMENTUM:  - Non-invasive serous implant.  - One of one lymph nodes negative for carcinoma (0/1).   ONCOLOGY TABLE:   OVARY or FALLOPIAN TUBE or PRIMARY PERITONEUM: Resection   Procedure: Hysterectomy with bilateral salpingo-oophorectomy and omental resection.  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  Other Tissue/ Organ Involvement: Not applicable  Largest Extrapelvic Peritoneal Focus: 2 mm  Peritoneal/Ascitic Fluid Involvement: Not applicable  Chemotherapy Response Score (CRS): Cannot be determined  Regional Lymph Nodes:       Number of Nodes with Metastasis Greater than 10 mm: 0       Number of Nodes with Metastasis 10 mm or Less (excludes isolated  tumor cells): 0       Number of Nodes with Isolated Tumor Cells (0.2 mm or less): 0       Number of Lymph Nodes Examined: 1  Distant Metastasis:       Distant Site(s) Involved: Not applicable  Pathologic Stage Classification (pTNM, AJCC 8th Edition): ypT3a, ypN0  Ancillary Studies: Can be performed upon request  Representative Tumor Block: A7  Comment(s): Given extensive involvement and neoadjuvant therapy it is difficult to determine the exact primary location, but the right ovary is slightly favored. Dr. Belvie  has reviewed the case.    07/02/2021 Surgery   Date of Service: 07/02/21   Preoperative Diagnosis: Malignancy presumed to be of gyn origin based on IHC, lack of clinical response to NACT   Postoperative Diagnosis: Stage IIIC presumed ovarian cancer    Procedure(s) Performed: Exploratory laparotomy with total hysterectomy bilateral salpingo-oophorectomy, total abdominal hysterectomy, tumor debulking including omentectomy and excision of several tumor plaques versus treated disease, oversew of bladder peritoneum   Surgeon: Odean Dollar, MD Specimens: Uterus Cervix, Bilateral tubes / ovaries, peritoneal plaques (sent with uterus and cervix), omentum   Estimated Blood Loss: 250 mL.     Ascites: approximately 2L upon entry and produced during surgery Operative Findings: Small mobile uterus, nodular masses filling the cul de sac. On intra-abdominal entry, approximately 1.6L of green-tinged ascites was encountered. Normal liver edge, diaphragm, and stomach. Infracolic omentum with a 6x4cm nodular area and several smaller (1-3cm) deposits c/w tumor. Otherwise, omentum without evidence of disease. Small fibrinous debris on the small bowel, no obvious tumor implants. Mesentery free of disease. No appreciable adenopathy. Bilateral ovaries replaced with vesicular tumors (L>R), measuring approximately 8cm and 12cm. Several areas of plaque (appeared more consistent with treated tumor than active cancer - right pelvic sidewall, posterior cul de sac, and anterior cul de sac) noted, most  excised. Uterus 4-6cm in size. Dense adhesions of the bladder to the LUS and cervix, likely combination of prior c-section and treatment effect. R0 resection at the end of surgery if peritoneal plaques not c/w tumor. No clear umbilical hernia.    07/17/2021 Tumor Marker   Patient's tumor was tested for the following markers: CA-125. Results of the tumor marker test revealed 116.   08/06/2021 Genetic Testing   Negative genetic testing:  no pathogenic variants detected in [facility] TumorNext-HRD + CancerNext Panel.  The report date is August 06, 2021.    The CancerNext gene panel offered by [employer] includes sequencing and rearrangement analysis for the following 37 genes:   APC, ATM, AXIN2 BARD1, BMPR1A, BRCA1, BRCA2, BRIP1, CDH1, CDK4, CDKN2A, CHEK2, DICER1, HOXB13, EPCAM, GREM1, MLH1, MRE11A, MSH2, MSH3, MSH6, MUTYH, NBN, NF1, PALB2, PMS2, POLD1, POLE, PTEN, RAD50, RAD51C, RAD51D, RECQL, SMAD4, SMARCA4, STK11, and TP53. TumorNext-HRD is a paired tumor and germline analysis of BRCA1 and BRCA2 plus 9 additional genes in the homologous recombination repair pathway (ATM, BARD1, BRCA1, BRCA2, BRIP1, CHEK2, MRE11A, NBN, PALB2, RAD51C, RAD51D).     Interval History: Doing well.  Denies any abdominal or pelvic pain.  Reports baseline bowel and bladder function.  Past Medical/Surgical History: Past Medical History:  Diagnosis Date   Allergy 1964   Anemia    Bilateral hip pain 04/14/2023   Bronchitis    Cancer (HCC)    Change in bowel habits 04/11/2021   Complication of anesthesia    Endometriosis    Family history of renal cancer 05/09/2021   History of anemia    Leukocytosis 05/14/2021   Migraines    Osteoporosis    Ovarian cancer (HCC) 04/09/2021   PONV (postoperative nausea and vomiting)    Screening for osteoporosis 08/12/2021    Past Surgical History:  Procedure Laterality Date   CESAREAN SECTION  1986, 1990   and BTSP   COLONOSCOPY     DEBULKING N/A 07/02/2021   Procedure: TUMOR DEBULKING;  Surgeon: Viktoria Comer SAUNDERS, MD;  Location: [facility];  Service: Gynecology;  Laterality: N/A;   FRACTURE SURGERY  Sept 2,2021   HYSTERECTOMY ABDOMINAL WITH SALPINGO-OOPHORECTOMY N/A 07/02/2021   Procedure: HYSTERECTOMY ABDOMINAL WITH BILATERAL SALPINGO-OOPHORECTOMY; OMENTECTOMY;  Surgeon: Viktoria Comer SAUNDERS, MD;  Location: [facility];  Service: Gynecology;  Laterality: N/A;   LAPAROSCOPY     for infertility-endometriosis    LAPAROSCOPY     2nd for infertility   mole removed     precancerous mole right upper abd   ORIF WRIST FRACTURE Right 08/02/2020   Procedure: Right distal radius open reduction internal fixation;  Surgeon: Carolee Lynwood JINNY DOUGLAS, MD;  Location: [facility];  Service: Orthopedics;  Laterality: Right;    TONSILLECTOMY AND ADENOIDECTOMY     TUBAL LIGATION  O7/28/1989    Family History  Problem Relation Age of Onset   Renal cancer Father 42       Agent Orange exposure   Cancer Father    Hypertension Sister    Hypothyroidism Sister    Other Sister        Wegener's syndrome   Hypertension Brother    Diabetes Maternal Grandmother    Heart attack Paternal Grandfather    Osteoporosis Other    Colon cancer Neg Hx    Breast cancer Neg Hx    Uterine cancer Neg Hx    Ovarian cancer Neg Hx    Endometrial cancer Neg Hx    Prostate cancer Neg Hx  Pancreatic cancer Neg Hx     Social History   Socioeconomic History   Marital status: Married    Spouse name: Phil   Number of children: 2   Years of education: Not on file   Highest education level: Master's degree (e.g., MA, MS, MEng, MEd, MSW, MBA)  Occupational History   Occupation: retired  Tobacco Use   Smoking status: Former    Current packs/day: 0.00    Average packs/day: 1 pack/day for 14.0 years (14.0 ttl pk-yrs)    Types: Cigarettes    Start date: 12/01/1969    Quit date: 12/02/1983    Years since quitting: 40.8   Smokeless tobacco: Never   Tobacco comments:    I have not smoked in 37 years.  Vaping Use   Vaping status: Never Used  Substance and Sexual Activity   Alcohol use: Not Currently    Comment: Maybe 2 a month.   Drug use: No   Sexual activity: Yes    Partners: Male    Birth control/protection: Post-menopausal, Surgical    Comment: BTL  Other Topics Concern   Not on file  Social History Narrative   Not on file   Social Drivers of Health   Financial Resource Strain: Low Risk  (06/02/2024)   Overall Financial  Resource Strain (CARDIA)    Difficulty of Paying Living Expenses: Not hard at all  Food Insecurity: No Food Insecurity (06/02/2024)   Hunger Vital Sign    Worried About Running Out of Food in the Last Year: Never true    Ran Out of Food in the Last Year: Never true  Transportation Needs: No Transportation Needs (06/02/2024)   PRAPARE - Administrator, Civil Service (Medical): No    Lack of Transportation (Non-Medical): No  Physical Activity: Sufficiently Active (06/02/2024)   Exercise Vital Sign    Days of Exercise per Week: 5 days    Minutes of Exercise per Session: 130 min  Stress: No Stress Concern Present (06/02/2024)   [employer] of Occupational Health - Occupational Stress Questionnaire    Feeling of Stress: Not at all  Social Connections: Socially Integrated (06/02/2024)   Social Connection and Isolation Panel    Frequency of Communication with Friends and Family: More than three times a week    Frequency of Social Gatherings with Friends and Family: More than three times a week    Attends Religious Services: More than 4 times per year    Active Member of [employer] or Organizations: Yes    Attends Engineer, Structural: More than 4 times per year    Marital Status: Married    Current Medications:  Current Outpatient Medications:    acetaminophen  (TYLENOL ) 500 MG tablet, Take 500 mg by mouth every 6 (six) hours as needed for moderate pain or headache., Disp: , Rfl:    Boswellia-Glucosamine-Vit D (OSTEO BI-FLEX ONE PER DAY PO), Take by mouth daily., Disp: , Rfl:    Calcium Carbonate-Vit D-Min (CALTRATE 600+D PLUS MINERALS PO), , Disp: , Rfl:    EPINEPHrine  0.3 mg/0.3 mL IJ SOAJ injection, Inject 0.3 mg into the muscle as needed for anaphylaxis., Disp: 1 each, Rfl: 1   ibuprofen  (ADVIL ) 600 MG tablet, Take 1 tablet (600 mg total) by mouth every 6 (six) hours as needed for moderate pain. For AFTER surgery, Disp: 30 tablet, Rfl: 0   magnesium 30 MG tablet, Take 30 mg  by mouth 2 (two) times daily., Disp: , Rfl:  Multiple Vitamins-Minerals (ADULT GUMMY PO), Take 2 capsules by mouth daily., Disp: , Rfl:    OVER THE COUNTER MEDICATION, Take 100 mg by mouth daily. Natures Made Collagen Gummy, Disp: , Rfl:    OVER THE COUNTER MEDICATION, Take 500 mcg by mouth daily at 2 PM. Superior Source K-2, Disp: , Rfl:    polyethylene glycol (MIRALAX / GLYCOLAX) 17 g packet, Take 17 g by mouth daily., Disp: , Rfl:    senna-docusate (SENOKOT-S) 8.6-50 MG tablet, Take 2 tablets by mouth at bedtime. For AFTER surgery, do not take if having diarrhea, Disp: 30 tablet, Rfl: 0   SUMAtriptan  (IMITREX ) 100 MG tablet, 1 tab with headache onset.  Can repeat in 2 hours.  Max dosage 200mg /24 hours., Disp: 36 tablet, Rfl: 4   tamoxifen  (NOLVADEX ) 20 MG tablet, Take 1 tablet (20 mg total) by mouth daily., Disp: 30 tablet, Rfl: 12  Review of Systems: Denies appetite changes, fevers, chills, fatigue, unexplained weight changes. Denies hearing loss, neck lumps or masses, mouth sores, ringing in ears or voice changes. Denies cough or wheezing.  Denies shortness of breath. Denies chest pain or palpitations. Denies leg swelling. Denies abdominal distention, pain, blood in stools, constipation, diarrhea, nausea, vomiting, or early satiety. Denies pain with intercourse, dysuria, frequency, hematuria or incontinence. Denies hot flashes, pelvic pain, vaginal bleeding or vaginal discharge.   Denies joint pain, back pain or muscle pain/cramps. Denies itching, rash, or wounds. Denies dizziness, headaches, numbness or seizures. Denies swollen lymph nodes or glands, denies easy bruising or bleeding. Denies anxiety, depression, confusion, or decreased concentration.  Physical Exam: BP 127/83 (BP Location: Right Arm, Patient Position: Sitting)   Pulse 60   Temp 97.9 F (36.6 C) (Oral)   Resp 20   Wt 138 lb 12.8 oz (63 kg)   LMP 10/31/2008   SpO2 100%   BMI 23.64 kg/m  General: Alert,  oriented, no acute distress. HEENT: Normocephalic, atraumatic, sclera anicteric. Chest: Unlabored breathing on room air.  Lungs are clear to auscultation bilaterally, no wheezes or rhonchi. Cardiovascular: Regular rate and rhythm, no murmurs, rubs, or gallops appreciated. Abdomen: soft, nontender.  Normoactive bowel sounds.  No masses or hepatosplenomegaly appreciated.  Well-healed scar. Extremities: Grossly normal range of motion.  Warm, well perfused.  No edema bilaterally. Skin: No rashes or lesions noted. Lymphatics: No cervical, supraclavicular, or inguinal adenopathy. GU: Normal appearing external genitalia without erythema, excoriation, or lesions.  Speculum exam reveals mildly atrophic vaginal mucosa, no lesions or masses, minimal physiologic discharge.  Bimanual exam reveals cuff intact, no nodularity or masses.  Rectovaginal exam confirms these findings.  Laboratory & Radiologic Studies:          Component Ref Range & Units (hover) 5 mo ago (05/12/24) 10 mo ago (12/11/23) 1 yr ago (08/28/23) 1 yr ago (05/28/23) 1 yr ago (02/20/23) 1 yr ago (10/28/22) 2 yr ago (07/21/22)  Cancer Antigen (CA) 125 13.6 12.2 CM 11.3 CM 10.6 CM 11.8 CM 13.9 CM 10.     Recent mammogram: BIRADS 1  Assessment & Plan: Karen Winters is a 74 y.o. 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 Surgery in 07/2021. Genetic testing shows no somatic or germline mutations. On Tamoxifen  maintenance.   Patient continues to do well and is NED on exam.  CA-125 drawn today.   She takes calcium and vitamin D   supplementation.  Based on last bone scan at the end of the 2023, we elected to stop letrozole  and start tamoxifen . She is doing very well on Tamoxifen . Next bone density due at end of 2025 (ordered today).  Refill for tamoxifen  sent.   Per NCCN guidelines,  she previously transitioned to visits every 3-6 months.  Next visit will be in 5-6 months. We reviewed signs and symptoms that should prompt a phone call to be seen sooner than that visit.  20 minutes of total time was spent for this patient encounter, including preparation, face-to-face counseling with the patient and coordination of care, and documentation of the encounter.  Comer Dollar, MD  Division of Gynecologic Oncology  Department of Obstetrics and Gynecology  [facility]

## 2024-10-13 NOTE — Patient Instructions (Signed)
 It was good to see you today.  I do not see or feel any evidence of cancer recurrence on your exam.  I will see you for follow-up in 5-6 months.  As always, if you develop any new and concerning symptoms before your next visit, please call to see me sooner.

## 2024-10-14 ENCOUNTER — Encounter: Payer: Self-pay | Admitting: Gynecologic Oncology

## 2024-10-14 ENCOUNTER — Ambulatory Visit: Payer: Self-pay | Admitting: Gynecologic Oncology

## 2024-10-14 ENCOUNTER — Telehealth: Payer: Self-pay | Admitting: *Deleted

## 2024-10-14 LAB — CA 125: Cancer Antigen (CA) 125: 13.5 U/mL (ref 0.0–38.1)

## 2024-10-14 NOTE — Telephone Encounter (Signed)
 LMOM for the patient to call the office back. Patient needs to be given the following information. Per Dr Viktoria patient scheduled for a bone density scan at [facility] on 1/16 at 9 am. Patient to arriver by 8:45 am. Patient instructions are no calcium or vitamins supplement day of exam, and no metal/belt at that the wrist that day.

## 2024-10-18 ENCOUNTER — Telehealth: Payer: Self-pay

## 2024-10-18 NOTE — Telephone Encounter (Signed)
 Karen Winters returned call  stating she rescheduled her bone density appointment to 1/12 @ 10:00 [facility]

## 2024-10-26 ENCOUNTER — Encounter: Payer: Self-pay | Admitting: Gynecologic Oncology

## 2024-12-06 ENCOUNTER — Other Ambulatory Visit (HOSPITAL_BASED_OUTPATIENT_CLINIC_OR_DEPARTMENT_OTHER)

## 2024-12-12 ENCOUNTER — Ambulatory Visit (HOSPITAL_BASED_OUTPATIENT_CLINIC_OR_DEPARTMENT_OTHER)
Admission: RE | Admit: 2024-12-12 | Discharge: 2024-12-12 | Disposition: A | Discharge status: Home / Self Care | Source: Ambulatory Visit | Attending: Gynecologic Oncology | Admitting: Gynecologic Oncology

## 2024-12-12 DIAGNOSIS — M818 Other osteoporosis without current pathological fracture: Secondary | ICD-10-CM | POA: Insufficient documentation

## 2025-03-23 ENCOUNTER — Inpatient Hospital Stay

## 2025-03-23 ENCOUNTER — Inpatient Hospital Stay: Admitting: Gynecologic Oncology

## 2025-04-21 ENCOUNTER — Inpatient Hospital Stay

## 2025-04-21 ENCOUNTER — Inpatient Hospital Stay: Admitting: Gynecologic Oncology

## 2025-06-26 ENCOUNTER — Ambulatory Visit: Payer: Self-pay | Admitting: Nurse Practitioner

## 2026-05-11 ENCOUNTER — Ambulatory Visit (HOSPITAL_BASED_OUTPATIENT_CLINIC_OR_DEPARTMENT_OTHER): Admitting: Obstetrics & Gynecology
